# Patient Record
Sex: Female | Born: 1990 | Race: White | Hispanic: No | Marital: Married | Smoking: Never smoker
Health system: Southern US, Community
[De-identification: ages and names within clinical notes are randomized; demographics above are authoritative.]

## PROBLEM LIST (undated history)

## (undated) ENCOUNTER — Emergency Department: Discharge status: Absent without leave | Payer: BC Managed Care – PPO

## (undated) DIAGNOSIS — I471 Supraventricular tachycardia, unspecified: Secondary | ICD-10-CM

## (undated) DIAGNOSIS — F419 Anxiety disorder, unspecified: Secondary | ICD-10-CM

## (undated) HISTORY — DX: Supraventricular tachycardia: I47.1

## (undated) HISTORY — DX: Anxiety disorder, unspecified: F41.9

## (undated) HISTORY — PX: ABLATION: SHX5711

## (undated) HISTORY — PX: NO PAST SURGERIES: SHX2092

## (undated) HISTORY — DX: Supraventricular tachycardia, unspecified: I47.10

---
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
## 2013-06-26 DIAGNOSIS — Z9889 Other specified postprocedural states: Secondary | ICD-10-CM | POA: Insufficient documentation

## 2016-08-19 ENCOUNTER — Ambulatory Visit: Payer: BC Managed Care – PPO | Admitting: Internal Medicine

## 2016-09-17 ENCOUNTER — Ambulatory Visit: Payer: BC Managed Care – PPO | Admitting: Internal Medicine

## 2016-11-29 ENCOUNTER — Ambulatory Visit: Payer: BC Managed Care – PPO | Admitting: Internal Medicine

## 2016-11-29 ENCOUNTER — Encounter: Payer: Self-pay | Admitting: Internal Medicine

## 2016-11-29 ENCOUNTER — Ambulatory Visit (INDEPENDENT_AMBULATORY_CARE_PROVIDER_SITE_OTHER): Payer: BC Managed Care – PPO | Admitting: Internal Medicine

## 2016-11-29 ENCOUNTER — Other Ambulatory Visit: Payer: Self-pay | Admitting: Internal Medicine

## 2016-11-29 VITALS — BP 128/66 | HR 110 | Temp 98.0°F | Ht 64.0 in | Wt 130.4 lb

## 2016-11-29 DIAGNOSIS — I471 Supraventricular tachycardia: Secondary | ICD-10-CM | POA: Diagnosis not present

## 2016-11-29 DIAGNOSIS — N946 Dysmenorrhea, unspecified: Secondary | ICD-10-CM

## 2016-11-29 DIAGNOSIS — R5383 Other fatigue: Secondary | ICD-10-CM

## 2016-11-29 DIAGNOSIS — F419 Anxiety disorder, unspecified: Secondary | ICD-10-CM

## 2016-11-29 NOTE — Progress Notes (Signed)
Pre visit review using our clinic review tool, if applicable. No additional management support is needed unless otherwise documented below in the visit note. 

## 2016-11-29 NOTE — Progress Notes (Signed)
Patient ID: Sara Richardson, female   DOB: 12/06/1990, 26 y.o.   MRN: [id number]   Subjective:    Patient ID: Sara Richardson, female    DOB: 03/19/1991, 26 y.o.   MRN: [id number]  HPI  Patient here to establish care.  She reports she is doing well.  Has been seeing gyn.  She has a history of SVT.  Is s/p ablation x 2.  Heart is doing better.  She avoids caffeine and stimulants.  Has had holter monitor and echo.  Saw cardiology.  States everything checked out ok. SVT started when senior in high school.  No problems with increased exercise or activity. Tries to stay active.  She does report some fatigue.  Had w/up last year (withblood work) for fatigue.  Increased stress with her job.  Has a history of anxiety.  Doing better this year.  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  Increased stress with this.  She takes lorazepam prn.  Rarely takes now.  She has learned coping skills to help with anxiety.  This year at school is better.  Discussed counseling.  She is interested in counseling.  She also reports increased cramps with her menstrual cycle.  Tries OCPs in the past.  Caused headache and emesis.  Tried a lower dose ocp.  Still had issues.  Not interested in an IUD.  Has tried antiinflammatories with no relief.  Has regular periods.  May last 6-7 days.  Heavy periods.  Reports some fatigue. Sleeps well.  Feels rested in am.     Past Medical History:  Diagnosis Date  . Anxiety   . SVT (supraventricular tachycardia) (HCC)    Past Surgical History:  Procedure Laterality Date  . NO PAST SURGERIES     Family History  Problem Relation Age of Onset  . Breast cancer Maternal Grandmother     lung cancer   . Prostate cancer Maternal Grandfather    Social History   Social History  . Marital status: Single    Spouse name: N/A  . Number of children: N/A  . Years of education: N/A   Occupational History  . 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 Smoking status: Never Smoker  . Smokeless tobacco: Never Used  . Alcohol use No  . Drug use: No  . Sexual activity: Yes    Partners: Male    Birth control/ protection: None, Condom   Other Topics Concern  . None   Social History Narrative  . None    Outpatient Encounter Prescriptions as of 11/29/2016  Medication Sig  . LORazepam (ATIVAN) 0.5 MG tablet Take by mouth.   No facility-administered encounter medications on file as of 11/29/2016.     Review of Systems  Constitutional: Negative for appetite change and unexpected weight change.  HENT: Negative for congestion and sinus pressure.   Respiratory: Negative for cough, chest tightness and shortness of breath.   Cardiovascular: Negative for chest pain, palpitations and leg swelling.  Gastrointestinal: Negative for abdominal pain, diarrhea, nausea and vomiting.  Genitourinary: Negative for difficulty urinating and dysuria.       Cramping as outlined.    Musculoskeletal: Negative for back pain and joint swelling.  Skin: Negative for color change and rash.  Neurological: Negative for dizziness, light-headedness and headaches.  Psychiatric/Behavioral: Negative for agitation and dysphoric mood.       Increased anxiety.  Objective:    Physical Exam  Constitutional: She appears well-developed and well-nourished. No distress.  HENT:  Nose: Nose normal.  Mouth/Throat: Oropharynx is clear and moist.  Neck: Neck supple. No thyromegaly present.  Cardiovascular: Normal rate and regular rhythm.   Pulmonary/Chest: Breath sounds normal. No respiratory distress. She has no wheezes.  Abdominal: Soft. Bowel sounds are normal. There is no tenderness.  Musculoskeletal: She exhibits no edema or tenderness.  Lymphadenopathy:    She has no cervical adenopathy.  Skin: No rash noted. No erythema.  Psychiatric: She has a normal mood and affect. Her behavior is normal.    BP 128/66   Pulse (!) 110   Temp 98 F (36.7 C) (Oral)    Ht 5\' 4"  (1.626 m)   Wt 130 lb 6.4 oz (59.1 kg)   LMP 11/25/2016   SpO2 99%   BMI 22.38 kg/m  Wt Readings from Last 3 Encounters:  11/29/16 130 lb 6.4 oz (59.1 kg)       Assessment & Plan:   Problem List Items Addressed This Visit    Anxiety    Discussed with her today.  Discussed counseling.  Information given.  She has lorazepam if needed.  Follow.  Doing better.        Relevant Medications   LORazepam (ATIVAN) 0.5 MG tablet   Menstrual cramps    Increased cramps as outlined.  Periods regular.  Heavy.  Has tried various antiinflammatories and has tried varying times taking.  No help.  Has tried OCPs as outlined.  Did not tolerated.  Discussed other options such as IUD, progesterone only ocp, etc.  After discussion, she would like to see gyn to discuss options.        Relevant Orders   Ambulatory referral to Gynecology   SVT (supraventricular tachycardia) ([place])    S/p ablation x 2.  Doing well.  Follow.  Continues to avoid caffeine.  Follow.         Other Visit Diagnoses    Fatigue, unspecified type    -  Primary   discussed with her today.  had labs last year.  cbc, metabolic panel and thyroid - wnl.  hold on repeat labs.  anxiety better.  follow.         Einar Pheasant, MD

## 2016-12-01 ENCOUNTER — Encounter: Payer: Self-pay | Admitting: Internal Medicine

## 2016-12-01 DIAGNOSIS — I471 Supraventricular tachycardia, unspecified: Secondary | ICD-10-CM | POA: Insufficient documentation

## 2016-12-01 DIAGNOSIS — F419 Anxiety disorder, unspecified: Secondary | ICD-10-CM | POA: Insufficient documentation

## 2016-12-01 DIAGNOSIS — N946 Dysmenorrhea, unspecified: Secondary | ICD-10-CM | POA: Insufficient documentation

## 2016-12-01 NOTE — Assessment & Plan Note (Signed)
Increased cramps as outlined.  Periods regular.  Heavy.  Has tried various antiinflammatories and has tried varying times taking.  No help.  Has tried OCPs as outlined.  Did not tolerated.  Discussed other options such as IUD, progesterone only ocp, etc.  After discussion, she would like to see gyn to discuss options.

## 2016-12-01 NOTE — Assessment & Plan Note (Signed)
S/p ablation x 2.  Doing well.  Follow.  Continues to avoid caffeine.  Follow.

## 2016-12-01 NOTE — Assessment & Plan Note (Signed)
Discussed with her today.  Discussed counseling.  Information given.  She has lorazepam if needed.  Follow.  Doing better.

## 2016-12-03 ENCOUNTER — Telehealth: Payer: Self-pay | Admitting: *Deleted

## 2016-12-03 NOTE — Telephone Encounter (Signed)
Okay to refill? Last OV: 11/29/16 & Next OV: 04/08/17

## 2016-12-04 MED ORDER — LORAZEPAM 0.5 MG PO TABS: ORAL_TABLET | ORAL | 0 refills | Status: DC

## 2016-12-04 NOTE — Telephone Encounter (Signed)
ok'd refill for lorazepam #30 with no refills.   

## 2016-12-06 NOTE — Telephone Encounter (Signed)
Pt has requested a update for this Rx  Pt contact [phone]

## 2016-12-06 NOTE — Telephone Encounter (Signed)
RX has been re-faxed.

## 2017-02-14 ENCOUNTER — Encounter: Payer: Self-pay | Admitting: Obstetrics and Gynecology

## 2017-04-08 ENCOUNTER — Ambulatory Visit: Payer: BC Managed Care – PPO | Admitting: Internal Medicine

## 2017-04-08 DIAGNOSIS — Z0289 Encounter for other administrative examinations: Secondary | ICD-10-CM

## 2017-04-29 ENCOUNTER — Ambulatory Visit (INDEPENDENT_AMBULATORY_CARE_PROVIDER_SITE_OTHER): Payer: BC Managed Care – PPO | Admitting: Obstetrics and Gynecology

## 2017-04-29 ENCOUNTER — Encounter: Payer: Self-pay | Admitting: Obstetrics and Gynecology

## 2017-04-29 VITALS — BP 117/82 | HR 145 | Ht 62.0 in | Wt 127.9 lb

## 2017-04-29 DIAGNOSIS — Z7689 Persons encountering health services in other specified circumstances: Secondary | ICD-10-CM

## 2017-04-29 DIAGNOSIS — N946 Dysmenorrhea, unspecified: Secondary | ICD-10-CM

## 2017-04-29 MED ORDER — NORETHINDRONE 0.35 MG PO TABS: 1.0000 | ORAL_TABLET | Freq: Every day | ORAL | 11 refills | Status: DC

## 2017-04-29 NOTE — Progress Notes (Signed)
Subjective:     Patient ID: Sara Richardson, female   DOB: [birth year], 26 y.o.   MRN: [id number]  HPI Always had painful menses, used to throw up when in college as it was so severe, now more related to frequent BMs for 2-3 days. interfers with job functions due to having to go to the bathroom constently.  Is a Pharmacist, hospital. Onset at age 22.  Reports monthly menses with light cramps the first day or two, then heavy for 2-3 days then lighter, total 7-8 days. Takes ibuprofen and it helped, but can't use it per cardiologist, and h/o SVT.  Took oral contraception in college, but couldn't tolerate them- vomiting/migraines/ sick all the time.  Is sexually active, using condoms.  Review of Systems     Objective:   Physical Exam A&O x4 Well groomed female in no distress Blood pressure 117/82, pulse (!) 145, height _0  (1.575 m), weight 127 lb 14.4 oz (58 kg), last menstrual period 04/12/2017. HRR and within normal range when rechecked.  Lungs clear Abdomen soft and not tender Pelvic exam deferred    Assessment:     Establish care Dysmenorrhea Situational anxiety H/o SVT    Plan:     Counseled at length regarding options for treating dysmenorrhea and associated diarrhea, including OCPs, Depo,Nexplanon, Mirena, anti-spasmotics, & tylenol.  Is interested in Depo, but will start with progestin only pill to monitor for side-effects. Will return in 6-8 weeks for annual exam and will follow up and consider switching to Depo at that time.  >50% of 20 minute visit spent in counseling.  Melody Gustine, CNM

## 2017-04-29 NOTE — Patient Instructions (Signed)
Dysmenorrhea °Menstrual cramps (dysmenorrhea) are caused by the muscles of the uterus tightening (contracting) during a menstrual period. For some women, this discomfort is merely bothersome. For others, dysmenorrhea can be severe enough to interfere with everyday activities for a few days each month. °Primary dysmenorrhea is menstrual cramps that last a couple of days when you start having menstrual periods or soon after. This often begins after a teenager starts having her period. As a woman gets older or has a baby, the cramps will usually lessen or disappear. Secondary dysmenorrhea begins later in life, lasts longer, and the pain may be stronger than primary dysmenorrhea. The pain may start before the period and last a few days after the period. °What are the causes? °Dysmenorrhea is usually caused by an underlying problem, such as: °· The tissue lining the uterus grows outside of the uterus in other areas of the body (endometriosis). °· The endometrial tissue, which normally lines the uterus, is found in or grows into the muscular walls of the uterus (adenomyosis). °· The pelvic blood vessels are engorged with blood just before the menstrual period (pelvic congestive syndrome). °· Overgrowth of cells (polyps) in the lining of the uterus or cervix. °· Falling down of the uterus (prolapse) because of loose or stretched ligaments. °· Depression. °· Bladder problems, infection, or inflammation. °· Problems with the intestine, a tumor, or irritable bowel syndrome. °· Cancer of the female organs or bladder. °· A severely tipped uterus. °· A very tight opening or closed cervix. °· Noncancerous tumors of the uterus (fibroids). °· Pelvic inflammatory disease (PID). °· Pelvic scarring (adhesions) from a previous surgery. °· Ovarian cyst. °· An intrauterine device (IUD) used for birth control. °What increases the risk? °You may be at greater risk of dysmenorrhea if: °· You are younger than age 30. °· You started puberty  early. °· You have irregular or heavy bleeding. °· You have never given birth. °· You have a family history of this problem. °· You are a smoker. °What are the signs or symptoms? °· Cramping or throbbing pain in your lower abdomen. °· Headaches. °· Lower back pain. °· Nausea or vomiting. °· Diarrhea. °· Sweating or dizziness. °· Loose stools. °How is this diagnosed? °A diagnosis is based on your history, symptoms, physical exam, diagnostic tests, or procedures. Diagnostic tests or procedures may include: °· Blood tests. °· Ultrasonography. °· An examination of the lining of the uterus (dilation and curettage, D&C). °· An examination inside your abdomen or pelvis with a scope (laparoscopy). °· X-rays. °· CT scan. °· MRI. °· An examination inside the bladder with a scope (cystoscopy). °· An examination inside the intestine or stomach with a scope (colonoscopy, gastroscopy). °How is this treated? °Treatment depends on the cause of the dysmenorrhea. Treatment may include: °· Pain medicine prescribed by your health care provider. °· Birth control pills or an IUD with progesterone hormone in it. °· Hormone replacement therapy. °· Nonsteroidal anti-inflammatory drugs (NSAIDs). These may help stop the production of prostaglandins. °· Surgery to remove adhesions, endometriosis, ovarian cyst, or fibroids. °· Removal of the uterus (hysterectomy). °· Progesterone shots to stop the menstrual period. °· Cutting the nerves on the sacrum that go to the female organs (presacral neurectomy). °· Electric current to the sacral nerves (sacral nerve stimulation). °· Antidepressant medicine. °· Psychiatric therapy, counseling, or group therapy. °· Exercise and physical therapy. °· Meditation and yoga therapy. °· Acupuncture. °Follow these instructions at home: °· Only take over-the-counter or prescription medicines as directed   by your health care provider. °· Place a heating pad or hot water bottle on your lower back or abdomen. Do not  sleep with the heating pad. °· Use aerobic exercises, walking, swimming, biking, and other exercises to help lessen the cramping. °· Massage to the lower back or abdomen may help. °· Stop smoking. °· Avoid alcohol and caffeine. °Contact a health care provider if: °· Your pain does not get better with medicine. °· You have pain with sexual intercourse. °· Your pain increases and is not controlled with medicines. °· You have abnormal vaginal bleeding with your period. °· You develop nausea or vomiting with your period that is not controlled with medicine. °Get help right away if: °You pass out. °This information is not intended to replace advice given to you by your health care provider. Make sure you discuss any questions you have with your health care provider. °Document Released: 10/28/2005 Document Revised: 04/04/2016 Document Reviewed: 04/15/2013 °Elsevier Interactive Patient Education © 2017 Elsevier Inc. ° °

## 2017-04-30 ENCOUNTER — Encounter: Payer: Self-pay | Admitting: Obstetrics and Gynecology

## 2017-05-08 ENCOUNTER — Encounter: Payer: Self-pay | Admitting: Obstetrics and Gynecology

## 2017-06-18 ENCOUNTER — Encounter: Payer: Self-pay | Admitting: Obstetrics and Gynecology

## 2017-06-18 ENCOUNTER — Ambulatory Visit (INDEPENDENT_AMBULATORY_CARE_PROVIDER_SITE_OTHER): Payer: BC Managed Care – PPO | Admitting: Obstetrics and Gynecology

## 2017-06-18 VITALS — BP 124/80 | HR 104 | Ht 62.0 in | Wt 131.7 lb

## 2017-06-18 DIAGNOSIS — Z01419 Encounter for gynecological examination (general) (routine) without abnormal findings: Secondary | ICD-10-CM | POA: Diagnosis not present

## 2017-06-18 MED ORDER — MEDROXYPROGESTERONE ACETATE 150 MG/ML IM SUSP: 150.0000 mg | INTRAMUSCULAR | 4 refills | Status: DC

## 2017-06-18 NOTE — Patient Instructions (Addendum)
Preventive Care 18-39 Years, Female Preventive care refers to lifestyle choices and visits with your health care provider that can promote health and wellness. What does preventive care include?  A yearly physical exam. This is also called an annual well check.  Dental exams once or twice a year.  Routine eye exams. Ask your health care provider how often you should have your eyes checked.  Personal lifestyle choices, including: ? Daily care of your teeth and gums. ? Regular physical activity. ? Eating a healthy diet. ? Avoiding tobacco and drug use. ? Limiting alcohol use. ? Practicing safe sex. ? Taking vitamin and mineral supplements as recommended by your health care provider. What happens during an annual well check? The services and screenings done by your health care provider during your annual well check will depend on your age, overall health, lifestyle risk factors, and family history of disease. Counseling Your health care provider may ask you questions about your:  Alcohol use.  Tobacco use.  Drug use.  Emotional well-being.  Home and relationship well-being.  Sexual activity.  Eating habits.  Work and work Statistician.  Method of birth control.  Menstrual cycle.  Pregnancy history.  Screening You may have the following tests or measurements:  Height, weight, and BMI.  Diabetes screening. This is done by checking your blood sugar (glucose) after you have not eaten for a while (fasting).  Blood pressure.  Lipid and cholesterol levels. These may be checked every 5 years starting at age 38.  Skin check.  Hepatitis C blood test.  Hepatitis B blood test.  Sexually transmitted disease (STD) testing.  BRCA-related cancer screening. This may be done if you have a family history of breast, ovarian, tubal, or peritoneal cancers.  Pelvic exam and Pap test. This may be done every 3 years starting at age 38. Starting at age 30, this may be done  every 5 years if you have a Pap test in combination with an HPV test.  Discuss your test results, treatment options, and if necessary, the need for more tests with your health care provider. Vaccines Your health care provider may recommend certain vaccines, such as:  Influenza vaccine. This is recommended every year.  Tetanus, diphtheria, and acellular pertussis (Tdap, Td) vaccine. You may need a Td booster every 10 years.  Varicella vaccine. You may need this if you have not been vaccinated.  HPV vaccine. If you are 39 or younger, you may need three doses over 6 months.  Measles, mumps, and rubella (MMR) vaccine. You may need at least one dose of MMR. You may also need a second dose.  Pneumococcal 13-valent conjugate (PCV13) vaccine. You may need this if you have certain conditions and were not previously vaccinated.  Pneumococcal polysaccharide (PPSV23) vaccine. You may need one or two doses if you smoke cigarettes or if you have certain conditions.  Meningococcal vaccine. One dose is recommended if you are age 68-21 years and a first-year college student living in a residence hall, or if you have one of several medical conditions. You may also need additional booster doses.  Hepatitis A vaccine. You may need this if you have certain conditions or if you travel or work in places where you may be exposed to hepatitis A.  Hepatitis B vaccine. You may need this if you have certain conditions or if you travel or work in places where you may be exposed to hepatitis B.  Haemophilus influenzae type b (Hib) vaccine. You may need this  if you have certain risk factors.  Talk to your health care provider about which screenings and vaccines you need and how often you need them. This information is not intended to replace advice given to you by your health care provider. Make sure you discuss any questions you have with your health care provider. Document Released: 12/24/2001 Document Revised:  07/17/2016 Document Reviewed: 08/29/2015 Elsevier Interactive Patient Education  2017 Elsevier Inc.   Probiotics What are probiotics? Probiotics are the good bacteria and yeasts that live in your body and keep you and your digestive system healthy. Probiotics also help your body's defense (immune) system and protect your body against bad bacterial growth. Certain foods contain probiotics, such as yogurt. Probiotics can also be purchased as a supplement. As with any supplement or drug, it is important to discuss its use with your health care provider. What affects the balance of bacteria in my body? The balance of bacteria in your body can be affected by:  Antibiotic medicines. Antibiotics are sometimes necessary to treat infection. Unfortunately, they may kill good or friendly bacteria in your body as well as the bad bacteria. This may lead to stomach problems like diarrhea, gas, and cramping.  Disease. Some conditions are the result of an overgrowth of bad bacteria, yeasts, parasites, or fungi. These conditions include: ? Infectious diarrhea. ? Stomach and respiratory infections. ? Skin infections. ? Irritable bowel syndrome (IBS). ? Inflammatory bowel diseases. ? Ulcer due to Helicobacter pylori (H. pylori) infection. ? Tooth decay and periodontal disease. ? Vaginal infections.  Stress and poor diet may also lower the good bacteria in your body. What type of probiotic is right for me? Probiotics are available over the counter at your local pharmacy, health food, or grocery store. They come in many different forms, combinations of strains, and dosing strengths. Some may need to be refrigerated. Always read the label for storage and usage instructions. Specific strains have been shown to be more effective for certain conditions. Ask your health care provider what option is best for you. Why would I need probiotics? There are many reasons your health care provider might recommend a  probiotic supplement, including:  Diarrhea.  Constipation.  IBS.  Respiratory infections.  Yeast infections.  Acne, eczema, and other skin conditions.  Frequent urinary tract infections (UTIs).  Are there side effects of probiotics? Some people experience mild side effects when taking probiotics. Side effects are usually temporary and may include:  Gas.  Bloating.  Cramping.  Rarely, serious side effects, such as infection or immune system changes, may occur. What else do I need to know about probiotics?  There are many different strains of probiotics. Certain strains may be more effective depending on your condition. Probiotics are available in varying doses. Ask your health care provider which probiotic you should use and how often.  If you are taking probiotics along with antibiotics, it is generally recommended to wait at least 2 hours between taking the antibiotic and taking the probiotic. For more information: [facility] [employer]cy This information is not intended to replace advice given to you by your health care provider. Make sure you discuss any questions you have with your health care provider. Document Released: 05/25/2014 Document Revised: 09/24/2016 Document Reviewed: 01/25/2014 Elsevier Interactive Patient Education  2017 [employer].   Hormonal Contraception Information Hormonal contraception is a type of birth control that uses hormones to prevent pregnancy. It usually involves a combination of the hormones estrogen and progesterone  or only the hormone progesterone. Hormonal contraception works in these ways:  It thickens the mucus in the cervix, making it harder for sperm to enter the uterus.  It changes the lining of the uterus, making it harder for an egg to implant.  It may stop the ovaries from releasing eggs (ovulation). Some women who take hormonal contraceptives that contain only  progesterone may continue to ovulate.  Hormonal contraception cannot prevent sexually transmitted infections (STIs). Pregnancy may still occur. Estrogen and progesterone contraceptives Contraceptives that use a combination of estrogen and progesterone are available in these forms:  Pill. Pills come in different combinations of hormones. They must be taken at the same time each day. Pills can affect your period, causing you to get your period once every three months or not at all.  Patch. The patch must be worn on the lower abdomen for three weeks and then removed on the fourth.  Vaginal ring. The ring is placed in the vagina and left there for three weeks. It is then removed for one week.  Progesterone contraceptives Contraceptives that use progesterone only are available in these forms:  Pill. Pills should be taken every day of the cycle.  Intrauterine device (IUD). This device is inserted into the uterus and removed or replaced every five years or sooner.  Implant. Plastic rods are placed under the skin of the upper arm. They are removed or replaced every three years or sooner.  Injection. The injection is given once every 90 days.  What are the side effects? The side effects of estrogen and progesterone contraceptives include:  Nausea.  Headaches.  Breast tenderness.  Bleeding or spotting between menstrual cycles.  High blood pressure (rare).  Strokes, heart attacks, or blood clots (rare)  Side effects of progesterone-only contraceptives include:  Nausea.  Headaches.  Breast tenderness.  Unpredictable menstrual bleeding.  High blood pressure (rare).  Talk to your health care provider about what side effects may affect you. Where to find more information:  Ask your health care provider for more information and resources about hormonal contraception.  [place] Department of Health and Programmer, systems on Women's Health: [place] Questions to  ask:  What type of hormonal contraception is right for me?  How long should I plan to use hormonal contraception?  What are the side effects of the hormonal contraception method I choose?  How can I prevent STIs while using hormonal contraception? Contact a health care provider if:  You start taking hormonal contraceptives and you develop persistent or severe side effects. Summary  Estrogen and progesterone are hormones used in many forms of birth control.  Talk to your health care provider about what side effects may affect you.  Hormonal contraception cannot prevent sexually transmitted infections (STIs).  Ask your health care provider for more information and resources about hormonal contraception. This information is not intended to replace advice given to you by your health care provider. Make sure you discuss any questions you have with your health care provider. Document Released: 11/17/2007 Document Revised: 09/27/2016 Document Reviewed: 09/27/2016 Elsevier Interactive Patient Education  [employer].

## 2017-06-18 NOTE — Progress Notes (Signed)
   Subjective:     Sara Richardson is a married white  26 y.o. female and is here for a comprehensive physical exam. The patient reports no problems. Different from las visit. Started OCPs two weeks ago and tolerating fine. Does feel like she has loose stools in general.  Social History   Social History  . Marital status: Single    Spouse name: N/A  . Number of children: N/A  . Years of education: N/A   Occupational History  . 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 Smoking status: Never Smoker  . Smokeless tobacco: Never Used  . Alcohol use No  . Drug use: No  . Sexual activity: Yes    Partners: Male    Birth control/ protection: None, Condom   Other Topics Concern  . Not on file   Social History Narrative  . No narrative on file   Health Maintenance  Topic Date Due  . TETANUS/TDAP  07/11/2011  . PAP SMEAR  07/10/2013  . INFLUENZA VACCINE  06/11/2017  . HIV Screening  12/05/2017 (Originally 07/11/2007)    The following portions of the patient's history were reviewed and updated as appropriate: allergies, current medications, past family history, past medical history, past social history, past surgical history and problem list.  Review of Systems Pertinent items noted in HPI and remainder of comprehensive ROS otherwise negative.   Objective:    General appearance: alert, cooperative and appears stated age Neck: no adenopathy, no carotid bruit, no JVD, supple, symmetrical, trachea midline and thyroid not enlarged, symmetric, no tenderness/mass/nodules Lungs: clear to auscultation bilaterally Breasts: normal appearance, no masses or tenderness Heart: regular rate and rhythm, S1, S2 normal, no murmur, click, rub or gallop Abdomen: soft, non-tender; bowel sounds normal; no masses,  no organomegaly Pelvic: cervix normal in appearance, external genitalia normal, no adnexal masses or tenderness, no cervical motion tenderness, rectovaginal  septum normal, uterus normal size, shape, and consistency, vagina normal without discharge and anteflexed    Assessment:    Healthy female exam. Dysmenorrhea  Family history of breast & colon cancer in MGM     Plan:  Does desire trial of Depo, will pick up script and return at her convience to get first shot, to continue camila until then. Discuss genetic screening, will talk to mother about it and let me know. Recommended bowel cleanse and then 1 month of daily pro-biotics.  RTC 1 year for AE or as needed.  Tearah Saulsbury Gayla Medicus, CNM   See After Visit Summary for Counseling Recommendations

## 2017-06-19 ENCOUNTER — Other Ambulatory Visit: Payer: Self-pay | Admitting: Obstetrics and Gynecology

## 2017-06-20 ENCOUNTER — Encounter: Payer: Self-pay | Admitting: Obstetrics and Gynecology

## 2017-06-20 LAB — CYTOLOGY - PAP

## 2017-06-30 ENCOUNTER — Ambulatory Visit: Payer: BC Managed Care – PPO | Admitting: Internal Medicine

## 2017-07-02 ENCOUNTER — Encounter: Payer: Self-pay | Admitting: Obstetrics and Gynecology

## 2017-07-03 ENCOUNTER — Other Ambulatory Visit: Payer: Self-pay | Admitting: *Deleted

## 2017-07-03 MED ORDER — NORETHINDRONE 0.35 MG PO TABS: 1.0000 | ORAL_TABLET | Freq: Every day | ORAL | 6 refills | Status: DC

## 2017-09-08 ENCOUNTER — Ambulatory Visit: Payer: BC Managed Care – PPO | Admitting: Obstetrics and Gynecology

## 2017-11-10 ENCOUNTER — Ambulatory Visit: Payer: Self-pay | Admitting: Nurse Practitioner

## 2017-11-10 ENCOUNTER — Ambulatory Visit: Payer: Self-pay

## 2017-11-10 VITALS — BP 122/74 | HR 139 | Temp 98.4°F | Resp 20

## 2017-11-10 DIAGNOSIS — M5440 Lumbago with sciatica, unspecified side: Secondary | ICD-10-CM

## 2017-11-10 MED ORDER — ETODOLAC 300 MG PO CAPS: 300.0000 mg | ORAL_CAPSULE | Freq: Three times a day (TID) | ORAL | 0 refills | Status: AC

## 2017-11-10 MED ORDER — CYCLOBENZAPRINE HCL 10 MG PO TABS: 10.0000 mg | ORAL_TABLET | Freq: Three times a day (TID) | ORAL | 0 refills | Status: AC | PRN

## 2017-11-10 MED ORDER — PREDNISONE 10 MG (21) PO TBPK: ORAL_TABLET | ORAL | 0 refills | Status: DC

## 2017-11-10 NOTE — Progress Notes (Signed)
Subjective:    Sara Richardson is a 26 y.o. female who presents for evaluation of low back pain. The patient has had recurrent self limited episodes of low back pain in the past. Symptoms have been present for 2 days and are gradually worsening.  Onset was related to / precipitated by no known injury and patient had a massage on Friday.. The pain is located in the across the lower back or radiating to bilateral leg(s) and radiates to the right thigh, left thigh. The pain is described as burning and sharp and occurs all day. She rates her pain as a 5 on a scale of 0-10. Symptoms are exacerbated by sitting up and walking. Symptoms are improved by lying down. She has also tried acetaminophen and NSAIDs which provided no symptom relief. She has no other symptoms associated with the back pain. History indicative of potentially complicated back pain includes no red flags..  The following portions of the patient's history were reviewed and updated as appropriate: allergies, current medications and past medical history.  Review of Systems Constitutional: negative Respiratory: negative Cardiovascular: negative Musculoskeletal:positive for back pain, negative for back pain and muscle spasms Neurological: negative Behavioral/Psych: negative    Objective:   Normal reflexes, gait, strength and negative straight-leg raise. Inspection and palpation: inspection of back is normal, no tenderness noted. Muscle tone and ROM exam: muscle tone normal without spasm, limited range of motion with pain, patient unable to touch toes or bend side to side without pain. Neurological: normal DTRs, muscle strength and reflexes. No loss of bowel or bladder function    Assessment:   Sciatica Plan:    Natural history and expected course discussed. Questions answered. Neurosurgeon distributed. Proper lifting, bending technique discussed. Stretching exercises discussed. Short (2-4 day) period of relative rest  recommended until acute symptoms improve. Ice to affected area as needed for local pain relief. NSAIDs per medication orders. OTC analgesics as needed. Muscle relaxants per medication orders. Sterapred Dose pack prescribed.

## 2017-11-10 NOTE — Patient Instructions (Addendum)
Acute Pain, Adult Acute pain is a type of pain that may last for just a few days or as long as six months. It is often related to an illness, injury, or medical procedure. Acute pain may be mild, moderate, or severe. It usually goes away once your injury has healed or you are no longer ill. Pain can make it hard for you to do daily activities. It can cause anxiety and lead to other problems if left untreated. Treatment depends on the cause and severity of your acute pain. Follow these instructions at home:  Check your pain level as told by your health care provider.  Take over-the-counter and prescription medicines only as told by your health care provider.  If you are taking prescription pain medicine: ? Ask your health care provider about taking a stool softener or laxative to prevent constipation. ? Do not stop taking the medicine suddenly. Talk to your health care provider about how and when to discontinue prescription pain medicine. ? If your pain is severe, do not take more pills than instructed by your health care provider. ? Do not take other over-the-counter pain medicines in addition to this medicine unless told by your health care provider. ? Do not drive or operate heavy machinery while taking prescription pain medicine.  Apply ice or heat as told by your health care provider. These may reduce swelling and pain.  Ask your health care provider if other strategies such as distraction, relaxation, or physical therapies can help your pain.  Keep all follow-up visits as told by your health care provider. This is important.  Take Sterapred as directed.  Continue to use ES Tylenol until steroids are completed, then use Etodolac as directed.  Apply heat to the affected areas 3-4 times daily.  Use back exercises provided.  Also try Salonpas patches to the lower back until symptoms improve.  Try to keep moving to avoid stiffness until symptoms improve. Contact a health care provider  if:  You have pain that is not controlled by medicine.  Your pain does not improve or gets worse.  You have side effects from pain medicines, such as vomitingor confusion. Get help right away if:  You have severe pain.  You have trouble breathing.  You lose consciousness.  You have chest pain or pressure that lasts for more than a few minutes. Along with the chest pain you may: ? Have pain or discomfort in one or both arms, your back, neck, jaw, or stomach. ? Have shortness of breath. ? Break out in a cold sweat. ? Feel nauseous. ? Become light-headed. These symptoms may represent a serious problem that is an emergency. Do not wait to see if the symptoms will go away. Get medical help right away. Call your local emergency services (911 in the [place] Do not drive yourself to the hospital. This information is not intended to replace advice given to you by your health care provider. Make sure you discuss any questions you have with your health care provider. Document Released: 11/12/2015 Document Revised: 04/05/2016 Document Reviewed: 11/12/2015 Elsevier Interactive Patient Education  2018 [employer].  Back Exercises The following exercises strengthen the muscles that help to support the back. They also help to keep the lower back flexible. Doing these exercises can help to prevent back pain or lessen existing pain. If you have back pain or discomfort, try doing these exercises 2-3 times each day or as told by your health care provider. When the pain goes away, do  them once each day, but increase the number of times that you repeat the steps for each exercise (do more repetitions). If you do not have back pain or discomfort, do these exercises once each day or as told by your health care provider. Exercises Single Knee to Chest  Repeat these steps 3-5 times for each leg: 1. Lie on your back on a firm bed or the floor with your legs extended. 2. Bring one knee to your chest. Your  other leg should stay extended and in contact with the floor. 3. Hold your knee in place by grabbing your knee or thigh. 4. Pull on your knee until you feel a gentle stretch in your lower back. 5. Hold the stretch for 10-30 seconds. 6. Slowly release and straighten your leg.  Pelvic Tilt  Repeat these steps 5-10 times: 1. Lie on your back on a firm bed or the floor with your legs extended. 2. Bend your knees so they are pointing toward the ceiling and your feet are flat on the floor. 3. Tighten your lower abdominal muscles to press your lower back against the floor. This motion will tilt your pelvis so your tailbone points up toward the ceiling instead of pointing to your feet or the floor. 4. With gentle tension and even breathing, hold this position for 5-10 seconds.  Cat-Cow  Repeat these steps until your lower back becomes more flexible: 1. Get into a hands-and-knees position on a firm surface. Keep your hands under your shoulders, and keep your knees under your hips. You may place padding under your knees for comfort. 2. Let your head hang down, and point your tailbone toward the floor so your lower back becomes rounded like the back of a cat. 3. Hold this position for 5 seconds. 4. Slowly lift your head and point your tailbone up toward the ceiling so your back forms a sagging arch like the back of a cow. 5. Hold this position for 5 seconds.  Press-Ups  Repeat these steps 5-10 times: 1. Lie on your abdomen (face-down) on the floor. 2. Place your palms near your head, about shoulder-width apart. 3. While you keep your back as relaxed as possible and keep your hips on the floor, slowly straighten your arms to raise the top half of your body and lift your shoulders. Do not use your back muscles to raise your upper torso. You may adjust the placement of your hands to make yourself more comfortable. 4. Hold this position for 5 seconds while you keep your back relaxed. 5. Slowly return  to lying flat on the floor.  Bridges  Repeat these steps 10 times: 1. Lie on your back on a firm surface. 2. Bend your knees so they are pointing toward the ceiling and your feet are flat on the floor. 3. Tighten your buttocks muscles and lift your buttocks off of the floor until your waist is at almost the same height as your knees. You should feel the muscles working in your buttocks and the back of your thighs. If you do not feel these muscles, slide your feet 1-2 inches farther away from your buttocks. 4. Hold this position for 3-5 seconds. 5. Slowly lower your hips to the starting position, and allow your buttocks muscles to relax completely.  If this exercise is too easy, try doing it with your arms crossed over your chest. Abdominal Crunches  Repeat these steps 5-10 times: 1. Lie on your back on a firm bed or the floor  with your legs extended. 2. Bend your knees so they are pointing toward the ceiling and your feet are flat on the floor. 3. Cross your arms over your chest. 4. Tip your chin slightly toward your chest without bending your neck. 5. Tighten your abdominal muscles and slowly raise your trunk (torso) high enough to lift your shoulder blades a tiny bit off of the floor. Avoid raising your torso higher than that, because it can put too much stress on your low back and it does not help to strengthen your abdominal muscles. 6. Slowly return to your starting position.  Back Lifts Repeat these steps 5-10 times: 1. Lie on your abdomen (face-down) with your arms at your sides, and rest your forehead on the floor. 2. Tighten the muscles in your legs and your buttocks. 3. Slowly lift your chest off of the floor while you keep your hips pressed to the floor. Keep the back of your head in line with the curve in your back. Your eyes should be looking at the floor. 4. Hold this position for 3-5 seconds. 5. Slowly return to your starting position.  Contact a health care provider  if:  Your back pain or discomfort gets much worse when you do an exercise.  Your back pain or discomfort does not lessen within 2 hours after you exercise. If you have any of these problems, stop doing these exercises right away. Do not do them again unless your health care provider says that you can. Get help right away if:  You develop sudden, severe back pain. If this happens, stop doing the exercises right away. Do not do them again unless your health care provider says that you can. This information is not intended to replace advice given to you by your health care provider. Make sure you discuss any questions you have with your health care provider. Document Released: 12/05/2004 Document Revised: 03/06/2016 Document Reviewed: 12/22/2014 Elsevier Interactive Patient Education  2017 [employer].

## 2017-11-10 NOTE — Telephone Encounter (Signed)
FYI

## 2017-11-10 NOTE — Telephone Encounter (Signed)
Pt calling with c/o lower back pain that shoots down to both thighs (right >left). Describes the shooting pain as burning and sharp. Rates pain 7 out of 10. States pain goes away if laying in bed and starts with sitting up or walking.  Pt states she had a massage on Friday and woke up Saturday am in pain. Care advice given. No appts available with PCP. Referred to [facility]. Pt has appt with [facility] @ 1200.   Reason for Disposition . [1] Pain radiates into the thigh or further down the leg AND [2] both legs  Answer Assessment - Initial Assessment Questions 1. ONSET: "When did the pain begin?"      Saturday 2. LOCATION: "Where does it hurt?" (upper, mid or lower back)     Lower back 3. SEVERITY: "How bad is the pain?"  (e.g., Scale 1-10; mild, moderate, or severe)   - MILD (1-3): doesn't interfere with normal activities    - MODERATE (4-7): interferes with normal activities or awakens from sleep    - SEVERE (8-10): excruciating pain, unable to do any normal activities      7 4. PATTERN: "Is the pain constant?" (e.g., yes, no; constant, intermittent)      Yes if not laying down. Sitting up and walking makes it worse 5. RADIATION: "Does the pain shoot into your legs or elsewhere?"     Yes both legs to the thigh and right leg is worse than left 6. CAUSE:  "What do you think is causing the back pain?"      Has had happened several times before but had a massage Friday. 7. BACK OVERUSE:  "Any recent lifting of heavy objects, strenuous work or exercise?"     no 8. MEDICATIONS: "What have you taken so far for the pain?" (e.g., nothing, acetaminophen, NSAIDS)     alieve and extra strength Tylenol 9. NEUROLOGIC SYMPTOMS: "Do you have any weakness, numbness, or problems with bowel/bladder control?"     no 10. OTHER SYMPTOMS: "Do you have any other symptoms?" (e.g., fever, abdominal pain, burning with urination, blood in urine)       no 11. PREGNANCY: "Is there any chance you are pregnant?" (e.g.,  yes, no; LMP)       No LMP: week before Christmas  Protocols used: BACK PAIN-A-AH

## 2017-11-12 ENCOUNTER — Telehealth: Payer: Self-pay

## 2017-11-12 NOTE — Telephone Encounter (Signed)
I left a message asking the patient to call us back. 

## 2017-12-01 ENCOUNTER — Ambulatory Visit (INDEPENDENT_AMBULATORY_CARE_PROVIDER_SITE_OTHER): Payer: Self-pay | Admitting: Family

## 2017-12-01 ENCOUNTER — Encounter: Payer: Self-pay | Admitting: Family

## 2017-12-01 VITALS — BP 133/75 | Temp 98.1°F | Wt 128.0 lb

## 2017-12-01 DIAGNOSIS — J029 Acute pharyngitis, unspecified: Secondary | ICD-10-CM

## 2017-12-01 LAB — POCT RAPID STREP A (OFFICE): Rapid Strep A Screen: NEGATIVE

## 2017-12-01 MED ORDER — PREDNISONE 10 MG (21) PO TBPK: ORAL_TABLET | ORAL | 0 refills | Status: DC

## 2017-12-01 NOTE — Patient Instructions (Signed)

## 2017-12-01 NOTE — Progress Notes (Signed)
Subjective:     Patient ID: Sara Richardson, female   DOB: [birth year], 27 y.o.   MRN: [id number]  HPI 27 year old female is in today with c/o sore throat, cough and chest congestion x 3 days. She has been taking OTC medication w/o much relief.   Review of Systems  Constitutional: Negative.   HENT: Positive for congestion, postnasal drip and sore throat.   Respiratory: Positive for cough. Negative for shortness of breath and wheezing.   Gastrointestinal: Negative.   Endocrine: Negative.   Musculoskeletal: Negative.   Skin: Negative.   Allergic/Immunologic: Negative.   Neurological: Negative.   Psychiatric/Behavioral: Negative.    Past Medical History:  Diagnosis Date  . Anxiety   . SVT (supraventricular tachycardia) (HCC)     Social History   Socioeconomic History  . Marital status: Single    Spouse name: Not on file  . Number of children: Not on file  . Years of education: Not on file  . Highest education level: Not on file  Social Needs  . Financial resource strain: Not on file  . Food insecurity - worry: Not on file  . Food insecurity - inability: Not on file  . Transportation needs - medical: Not on file  . Transportation needs - non-medical: Not on file  Occupational History  . 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 Smoking status: Never Smoker  . Smokeless tobacco: Never Used  Substance and Sexual Activity  . Alcohol use: No  . Drug use: No  . Sexual activity: Yes    Partners: Male    Birth control/protection: None, Condom  Other Topics Concern  . Not on file  Social History Narrative  . Not on file    Past Surgical History:  Procedure Laterality Date  . NO PAST SURGERIES      Family History  Problem Relation Age of Onset  . Breast cancer Maternal Grandmother        lung cancer   . Prostate cancer Maternal Grandfather     No Known Allergies  Current Outpatient Medications on File Prior to Visit  Medication Sig  Dispense Refill  . LORazepam (ATIVAN) 0.5 MG tablet 1/2 - 1 tablet q day prn 30 tablet 0  . norethindrone (MICRONOR,CAMILA,ERRIN) 0.35 MG tablet Take 1 tablet (0.35 mg total) by mouth daily. 3 Package 6  . medroxyPROGESTERone (DEPO-PROVERA) 150 MG/ML injection Inject 1 mL (150 mg total) into the muscle every 3 (three) months. (Patient not taking: Reported on 11/10/2017) 1 mL 4   No current facility-administered medications on file prior to visit.     BP 133/75   Temp 98.1 F (36.7 C)   Wt 128 lb (58.1 kg)   SpO2 100%   BMI 23.41 kg/m chart    Objective:   Physical Exam  Constitutional: She is oriented to person, place, and time. She appears well-developed and well-nourished.  HENT:  Right Ear: External ear normal.  Left Ear: External ear normal.  Pharynx moderately red  Neck: Normal range of motion. Neck supple.  Cardiovascular: Normal rate, regular rhythm and normal heart sounds.  Pulmonary/Chest: Effort normal and breath sounds normal.  Musculoskeletal: Normal range of motion.  Neurological: She is alert and oriented to person, place, and time.  Skin: Skin is warm and dry.  Psychiatric: She has a normal mood and affect.       Assessment:     Tyshia was seen today for selfpay-[place]-sorethroayt x2d.  Diagnoses and all orders for this visit:  Sorethroat -     POCT rapid strep A  Pharyngitis, unspecified etiology  Other orders -     predniSONE (STERAPRED UNI-PAK 21 TAB) 10 MG (21) TBPK tablet; As directed      Plan:     Call the office with any questions or concerns.

## 2017-12-08 ENCOUNTER — Ambulatory Visit (INDEPENDENT_AMBULATORY_CARE_PROVIDER_SITE_OTHER): Payer: BC Managed Care – PPO | Admitting: Internal Medicine

## 2017-12-08 ENCOUNTER — Encounter: Payer: Self-pay | Admitting: Internal Medicine

## 2017-12-08 VITALS — BP 128/58 | HR 93 | Temp 98.6°F | Ht 62.0 in | Wt 132.8 lb

## 2017-12-08 DIAGNOSIS — F419 Anxiety disorder, unspecified: Secondary | ICD-10-CM | POA: Diagnosis not present

## 2017-12-08 DIAGNOSIS — I471 Supraventricular tachycardia: Secondary | ICD-10-CM

## 2017-12-08 DIAGNOSIS — M5440 Lumbago with sciatica, unspecified side: Secondary | ICD-10-CM | POA: Diagnosis not present

## 2017-12-08 DIAGNOSIS — Z0000 Encounter for general adult medical examination without abnormal findings: Secondary | ICD-10-CM

## 2017-12-08 NOTE — Progress Notes (Signed)
Pre visit review using our clinic review tool, if applicable. No additional management support is needed unless otherwise documented below in the visit note. 

## 2017-12-08 NOTE — Progress Notes (Signed)
Patient ID: Sara Richardson, female   DOB: 12-25-1990, 27 y.o.   MRN: [id number]   Subjective:    Patient ID: Sara Richardson, female    DOB: 11/15/1990, 27 y.o.   MRN: [id number]  HPI  Patient here for her physical exam. She reports she is doing relatively well.  Has noticed some back discomfort.  Has had some issued with low back pain.  Has had intermittent issues for a while.  Played softball and volley ball in school.  Tries to stay active.  Notices increased spasms intermittently.  Notices if sits too long.  Is exercising.  Will feel pain down leg - into her lower legs (at times).  No numbness or tingling.  Had a massage approximately one month ago.  Seemed to worsen.  Has been told has sciatica.  Takes alleve prn.  Does not want to take increased amount of medication.  Saw gyn.  On progesterone only pill now - on since 6-05/2017.  Periods regular.  Was evaluated recently for sore throat.  Given prednisone.  Does not like to take prednisone.  Makes her heart race.  Overall heart doing well.     Past Medical History:  Diagnosis Date  . Anxiety   . SVT (supraventricular tachycardia) (HCC)    Past Surgical History:  Procedure Laterality Date  . NO PAST SURGERIES     Family History  Problem Relation Age of Onset  . Breast cancer Maternal Grandmother        lung cancer   . Prostate cancer Maternal Grandfather    Social History   Socioeconomic History  . Marital status: Single    Spouse name: None  . Number of children: None  . Years of education: None  . Highest education level: None  Social Needs  . Financial resource strain: None  . Food insecurity - worry: None  . Food insecurity - inability: None  . Transportation needs - medical: None  . Transportation needs - non-medical: None  Occupational History  . 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 Smoking status: Never Smoker  . Smokeless tobacco: Never Used  Substance and Sexual Activity  .  Alcohol use: No  . Drug use: No  . Sexual activity: Yes    Partners: Male    Birth control/protection: None, Condom  Other Topics Concern  . None  Social History Narrative  . None    Outpatient Encounter Medications as of 12/08/2017  Medication Sig  . LORazepam (ATIVAN) 0.5 MG tablet 1/2 - 1 tablet q day prn  . norethindrone (MICRONOR,CAMILA,ERRIN) 0.35 MG tablet Take 1 tablet (0.35 mg total) by mouth daily.  . [DISCONTINUED] medroxyPROGESTERone (DEPO-PROVERA) 150 MG/ML injection Inject 1 mL (150 mg total) into the muscle every 3 (three) months. (Patient not taking: Reported on 11/10/2017)  . [DISCONTINUED] predniSONE (STERAPRED UNI-PAK 21 TAB) 10 MG (21) TBPK tablet As directed   No facility-administered encounter medications on file as of 12/08/2017.     Review of Systems  Constitutional: Negative for appetite change and unexpected weight change.  HENT: Negative for congestion and sinus pressure.   Eyes: Negative for pain and visual disturbance.  Respiratory: Negative for cough, chest tightness and shortness of breath.   Cardiovascular: Negative for chest pain, palpitations and leg swelling.  Gastrointestinal: Negative for abdominal pain, diarrhea, nausea and vomiting.  Genitourinary: Negative for difficulty urinating and dysuria.  Musculoskeletal: Positive for back pain. Negative for joint swelling.  Skin:  Negative for color change and rash.  Neurological: Negative for dizziness, light-headedness and headaches.  Hematological: Negative for adenopathy. Does not bruise/bleed easily.  Psychiatric/Behavioral: Negative for agitation and dysphoric mood.       Objective:    Physical Exam  Constitutional: She is oriented to person, place, and time. She appears well-developed and well-nourished. No distress.  HENT:  Nose: Nose normal.  Mouth/Throat: Oropharynx is clear and moist.  Eyes: Right eye exhibits no discharge. Left eye exhibits no discharge. No scleral icterus.  Neck:  Neck supple. No thyromegaly present.  Cardiovascular: Normal rate and regular rhythm.  Pulmonary/Chest: Breath sounds normal. No accessory muscle usage. No tachypnea. No respiratory distress. She has no decreased breath sounds. She has no wheezes. She has no rhonchi. Right breast exhibits no inverted nipple, no mass, no nipple discharge and no tenderness (no axillary adenopathy). Left breast exhibits no inverted nipple, no mass, no nipple discharge and no tenderness (no axilarry adenopathy).  Abdominal: Soft. Bowel sounds are normal. There is no tenderness.  Musculoskeletal: She exhibits no edema or tenderness.  Lymphadenopathy:    She has no cervical adenopathy.  Neurological: She is alert and oriented to person, place, and time.  Skin: Skin is warm. No rash noted. No erythema.  Psychiatric: She has a normal mood and affect. Her behavior is normal.    BP (!) 128/58   Pulse 93   Temp 98.6 F (37 C) (Oral)   Ht 5\' 2"  (1.575 m)   Wt 132 lb 12.8 oz (60.2 kg)   LMP 11/21/2017 (Exact Date)   SpO2 99%   BMI 24.29 kg/m  Wt Readings from Last 3 Encounters:  12/08/17 132 lb 12.8 oz (60.2 kg)  12/01/17 128 lb (58.1 kg)  06/18/17 131 lb 11.2 oz (59.7 kg)     No results found for: WBC, HGB, HCT, PLT, GLUCOSE, CHOL, TRIG, HDL, LDLDIRECT, LDLCALC, ALT, AST, NA, K, CL, CREATININE, BUN, CO2, TSH, PSA, INR, GLUF, HGBA1C, MICROALBUR      Assessment & Plan:   Problem List Items Addressed This Visit    Anxiety    Stable.  Doing well.        Back pain    Persistent intermittent issue.  Worsened recently.  Massage worsened.  Does not want to take increased medication.  Hold on xray.  Discussed referral.  She agrees.  Will schedule to see Dr Tamala Julian.        Relevant Orders   Ambulatory referral to [facility] maintenance    Physical today 12/08/17.        SVT (supraventricular tachycardia) (HCC)    S/p ablation x 2.  Doing well.  Follow.         Other Visit Diagnoses     Routine general medical examination at a health care facility    -  Primary       Einar Pheasant, MD

## 2017-12-11 ENCOUNTER — Encounter: Payer: Self-pay | Admitting: Internal Medicine

## 2017-12-11 DIAGNOSIS — Z0000 Encounter for general adult medical examination without abnormal findings: Secondary | ICD-10-CM | POA: Insufficient documentation

## 2017-12-11 DIAGNOSIS — M549 Dorsalgia, unspecified: Secondary | ICD-10-CM | POA: Insufficient documentation

## 2017-12-11 NOTE — Assessment & Plan Note (Signed)
S/p ablation x 2.  Doing well.  Follow.

## 2017-12-11 NOTE — Assessment & Plan Note (Signed)
Stable  Doing well

## 2017-12-11 NOTE — Assessment & Plan Note (Signed)
Physical today 12/08/17.

## 2017-12-11 NOTE — Assessment & Plan Note (Signed)
Persistent intermittent issue.  Worsened recently.  Massage worsened.  Does not want to take increased medication.  Hold on xray.  Discussed referral.  She agrees.  Will schedule to see Dr Tamala Julian.

## 2017-12-14 ENCOUNTER — Other Ambulatory Visit: Payer: Self-pay | Admitting: Internal Medicine

## 2017-12-16 ENCOUNTER — Other Ambulatory Visit: Payer: Self-pay | Admitting: Internal Medicine

## 2017-12-16 NOTE — Telephone Encounter (Signed)
I have ok'd rx for lorazepam #30 with no refills.  Need to confirm no concern regarding pregnancy if takes.  Rarely uses.

## 2017-12-16 NOTE — Telephone Encounter (Signed)
Last OV 12/08/2017 Next OV 12/14/2018 Last refill 12/04/2016

## 2017-12-17 NOTE — Telephone Encounter (Signed)
Not able t o reach patient by phone and voicemail full.

## 2017-12-19 ENCOUNTER — Other Ambulatory Visit: Payer: Self-pay | Admitting: Internal Medicine

## 2017-12-19 NOTE — Telephone Encounter (Signed)
Rx has been faxed & pt notified.

## 2017-12-19 NOTE — Telephone Encounter (Signed)
Spoke with patient states on menses now .

## 2017-12-19 NOTE — Telephone Encounter (Signed)
Copied from [place]. Topic: Quick Communication - See Telephone Encounter >> Dec 19, 2017  4:10 PM Bea Graff, NT wrote: CRM for notification. See Telephone encounter for: Pt is at [facility] on [address] and they state that they have not received the rx for LORazepam (ATIVAN). They state it doesn't show its approved. She spoke to someone earlier and verified that she is not pregnant and that whoever she spoke to would call the pharmacy and clear that up, but the pharmacy has not heard anything. Pt is not pregnant. Please call pt once this is handled. She is leaving for the beach in 1 hour.   12/19/17.

## 2018-01-28 ENCOUNTER — Ambulatory Visit
Admission: RE | Admit: 2018-01-28 | Discharge: 2018-01-28 | Disposition: A | Discharge status: Home / Self Care | Payer: BC Managed Care – PPO | Source: Ambulatory Visit | Attending: Family Medicine | Admitting: Family Medicine

## 2018-01-28 ENCOUNTER — Ambulatory Visit (INDEPENDENT_AMBULATORY_CARE_PROVIDER_SITE_OTHER): Payer: BC Managed Care – PPO | Admitting: Family Medicine

## 2018-01-28 VITALS — BP 124/56 | Ht 62.0 in | Wt 130.0 lb

## 2018-01-28 DIAGNOSIS — G8929 Other chronic pain: Secondary | ICD-10-CM

## 2018-01-28 DIAGNOSIS — M545 Low back pain, unspecified: Secondary | ICD-10-CM

## 2018-01-28 NOTE — Patient Instructions (Signed)
Get the x-rays of your back at your convenience. I will call you with results and next steps. My concern is you have a possible spondylolysis or spondylolisthesis that's causing you to have chronic pain with flare-ups in the area. Treatment will depend on the x-ray results.

## 2018-01-29 ENCOUNTER — Encounter: Payer: Self-pay | Admitting: Family Medicine

## 2018-01-30 ENCOUNTER — Encounter: Payer: Self-pay | Admitting: Family Medicine

## 2018-01-30 NOTE — Progress Notes (Signed)
PCP and consultation requested by: Einar Pheasant, MD  Subjective:   HPI: Patient is a 27 y.o. female here for low back pain.  Patient reports that she has had low back pain off and on for about 10 years. She does not recall an acute injury but remembers this starting back in middle school she recalls having physical therapy and x-rays at that time. She states this flares up about 1-3 times a year usually takes a few days to resolve. She does yoga regularly and states this helps some. Her current episode started a few months ago but has been worsening Pain is sharp it can be up to a 10 out of 10. Denies any numbness or tingling. No bowel or bladder dysfunction. When the pain is severe she can feel pain into her legs to about the knees. She states lying on the couch or leaning back seems to make this worse.  Past Medical History:  Diagnosis Date  . Anxiety   . SVT (supraventricular tachycardia) ([place])     Current Outpatient Medications on File Prior to Visit  Medication Sig Dispense Refill  . LORazepam (ATIVAN) 0.5 MG tablet TAKE 1/2 TO 1 TABLET BY MOUTH EVERY DAY AS NEEDED 30 tablet 0  . norethindrone (MICRONOR,CAMILA,ERRIN) 0.35 MG tablet Take 1 tablet (0.35 mg total) by mouth daily. 3 Package 6   No current facility-administered medications on file prior to visit.     Past Surgical History:  Procedure Laterality Date  . NO PAST SURGERIES      No Known Allergies  Social History   Socioeconomic History  . Marital status: Married    Spouse name: Not on file  . Number of children: Not on file  . Years of education: Not on file  . Highest education level: Not on file  Occupational History  . Occupation: Pharmacist, hospital    CommentSports administrator - second grade  Social Needs  . Financial resource strain: Not on file  . Food insecurity:    Worry: Not on file    Inability: Not on file  . Transportation needs:    Medical: Not on file    Non-medical: Not on file  Tobacco Use  .  Smoking status: Never Smoker  . Smokeless tobacco: Never Used  Substance and Sexual Activity  . Alcohol use: No  . Drug use: No  . Sexual activity: Yes    Partners: Male    Birth control/protection: None, Condom  Lifestyle  . Physical activity:    Days per week: Not on file    Minutes per session: Not on file  . Stress: Not on file  Relationships  . Social connections:    Talks on phone: Not on file    Gets together: Not on file    Attends religious service: Not on file    Active member of club or organization: Not on file    Attends meetings of clubs or organizations: Not on file    Relationship status: Not on file  . Intimate partner violence:    Fear of current or ex partner: Not on file    Emotionally abused: Not on file    Physically abused: Not on file    Forced sexual activity: Not on file  Other Topics Concern  . Not on file  Social History Narrative  . Not on file    Family History  Problem Relation Age of Onset  . Breast cancer Maternal Grandmother        lung cancer   .  Prostate cancer Maternal Grandfather     BP (!) 124/56   Ht 5\' 2"  (1.575 m)   Wt 130 lb (59 kg)   LMP 01/14/2018   BMI 23.78 kg/m   Review of Systems: See HPI above.     Objective:  Physical Exam:  Gen: NAD, comfortable in exam room  Back: No gross deformity, scoliosis. TTP bilateral paraspinal lumbar regions.  No midline or bony TTP. FROM with pain worse on extension. Strength LEs 5/5 all muscle groups.   2+ MSRs in patellar and achilles tendons, equal bilaterally. Negative SLRs. Positive right stork's test. Sensation intact to light touch bilaterally.  Bilateral hips: No deformity. No tenderness to palpation. FROM without pain; 5/5 strength. NVI distally. Negative logroll bilateral hips Negative fabers and piriformis stretches.   Assessment & Plan:  1.  Low back pain: Chronic.  Radiographs were performed today and independently reviewed which showed no evidence of  acute bony abnormality or evidence of spondylolysis or spondylolisthesis.  We discussed her options and I do not think medications will help her a great deal.  Having said that I advised she can take Tylenol and/or Aleve as needed.  I encouraged her to do physical therapy with flexion based program.  We also discussed an MRI given the length that she has had these symptoms.  She stated she wished to speak to her husband and think about this and will call us back with how she would like to proceed.

## 2018-01-30 NOTE — Assessment & Plan Note (Signed)
Radiographs were performed today and independently reviewed which showed no evidence of acute bony abnormality or evidence of spondylolysis or spondylolisthesis.  We discussed her options and I do not think medications will help her a great deal.  Having said that I advised she can take Tylenol and/or Aleve as needed.  I encouraged her to do physical therapy with flexion based program.  We also discussed an MRI given the length that she has had these symptoms.  She stated she wished to speak to her husband and think about this and will call us back with how she would like to proceed.

## 2018-03-17 ENCOUNTER — Encounter: Payer: Self-pay | Admitting: Obstetrics and Gynecology

## 2018-04-24 ENCOUNTER — Encounter: Payer: Self-pay | Admitting: Obstetrics and Gynecology

## 2018-05-14 ENCOUNTER — Encounter: Payer: Self-pay | Admitting: Internal Medicine

## 2018-05-14 DIAGNOSIS — F439 Reaction to severe stress, unspecified: Secondary | ICD-10-CM

## 2018-05-14 DIAGNOSIS — F419 Anxiety disorder, unspecified: Secondary | ICD-10-CM

## 2018-05-16 NOTE — Telephone Encounter (Signed)
Ordered placed for psychiatry referral.

## 2018-06-26 ENCOUNTER — Encounter: Payer: Self-pay | Admitting: Obstetrics and Gynecology

## 2018-06-26 ENCOUNTER — Ambulatory Visit (INDEPENDENT_AMBULATORY_CARE_PROVIDER_SITE_OTHER): Payer: BC Managed Care – PPO | Admitting: Obstetrics and Gynecology

## 2018-06-26 VITALS — BP 124/84 | HR 77 | Ht 62.0 in | Wt 135.2 lb

## 2018-06-26 DIAGNOSIS — Z01419 Encounter for gynecological examination (general) (routine) without abnormal findings: Secondary | ICD-10-CM

## 2018-06-26 NOTE — Progress Notes (Signed)
  Subjective:     Mariska L Tamashiro is a married white 27 y.o. female and is here for a comprehensive physical exam. Is sexually active with female spouse.The patient reports problems - menses are irregular in timing..  Social History   Socioeconomic History  . Marital status: Married    Spouse name: Not on file  . Number of children: Not on file  . Years of education: Not on file  . Highest education level: Not on file  Occupational History  . Occupation: Pharmacist, hospital    CommentSports administrator - second grade  Social Needs  . Financial resource strain: Not on file  . Food insecurity:    Worry: Not on file    Inability: Not on file  . Transportation needs:    Medical: Not on file    Non-medical: Not on file  Tobacco Use  . Smoking status: Never Smoker  . Smokeless tobacco: Never Used  Substance and Sexual Activity  . Alcohol use: No  . Drug use: No  . Sexual activity: Yes    Partners: Male    Birth control/protection: None, Condom  Lifestyle  . Physical activity:    Days per week: Not on file    Minutes per session: Not on file  . Stress: Not on file  Relationships  . Social connections:    Talks on phone: Not on file    Gets together: Not on file    Attends religious service: Not on file    Active member of club or organization: Not on file    Attends meetings of clubs or organizations: Not on file    Relationship status: Not on file  . Intimate partner violence:    Fear of current or ex partner: Not on file    Emotionally abused: Not on file    Physically abused: Not on file    Forced sexual activity: Not on file  Other Topics Concern  . Not on file  Social History Narrative  . Not on file   Health Maintenance  Topic Date Due  . HIV Screening  07/11/2007  . TETANUS/TDAP  07/11/2011  . INFLUENZA VACCINE  06/11/2018  . PAP SMEAR  06/19/2020    The following portions of the patient's history were reviewed and updated as appropriate: allergies, current medications, past  family history, past medical history, past social history, past surgical history and problem list.  Review of Systems Pertinent items are noted in HPI.   Objective:    General appearance: alert, cooperative and appears stated age Neck: no adenopathy, no carotid bruit, no JVD, supple, symmetrical, trachea midline and thyroid not enlarged, symmetric, no tenderness/mass/nodules Lungs: clear to auscultation bilaterally Breasts: normal appearance, no masses or tenderness Heart: regular rate and rhythm, S1, S2 normal, no murmur, click, rub or gallop Abdomen: soft, non-tender; bowel sounds normal; no masses,  no organomegaly Pelvic: cervix normal in appearance, external genitalia normal, no adnexal masses or tenderness, no cervical motion tenderness, rectovaginal septum normal, uterus normal size, shape, and consistency and vagina normal without discharge    Assessment:    Healthy female exam.      Plan:  Will stop OCPs for now and continue condom use. RTC 1 year or as needed.  Nyron Mozer,CNM   See After Visit Summary for Counseling Recommendations

## 2018-06-26 NOTE — Patient Instructions (Signed)
Preventive Care 18-39 Years, Female Preventive care refers to lifestyle choices and visits with your health care provider that can promote health and wellness. What does preventive care include?  A yearly physical exam. This is also called an annual well check.  Dental exams once or twice a year.  Routine eye exams. Ask your health care provider how often you should have your eyes checked.  Personal lifestyle choices, including: ? Daily care of your teeth and gums. ? Regular physical activity. ? Eating a healthy diet. ? Avoiding tobacco and drug use. ? Limiting alcohol use. ? Practicing safe sex. ? Taking vitamin and mineral supplements as recommended by your health care provider. What happens during an annual well check? The services and screenings done by your health care provider during your annual well check will depend on your age, overall health, lifestyle risk factors, and family history of disease. Counseling Your health care provider may ask you questions about your:  Alcohol use.  Tobacco use.  Drug use.  Emotional well-being.  Home and relationship well-being.  Sexual activity.  Eating habits.  Work and work Statistician.  Method of birth control.  Menstrual cycle.  Pregnancy history.  Screening You may have the following tests or measurements:  Height, weight, and BMI.  Diabetes screening. This is done by checking your blood sugar (glucose) after you have not eaten for a while (fasting).  Blood pressure.  Lipid and cholesterol levels. These may be checked every 5 years starting at age 38.  Skin check.  Hepatitis C blood test.  Hepatitis B blood test.  Sexually transmitted disease (STD) testing.  BRCA-related cancer screening. This may be done if you have a family history of breast, ovarian, tubal, or peritoneal cancers.  Pelvic exam and Pap test. This may be done every 3 years starting at age 38. Starting at age 30, this may be done  every 5 years if you have a Pap test in combination with an HPV test.  Discuss your test results, treatment options, and if necessary, the need for more tests with your health care provider. Vaccines Your health care provider may recommend certain vaccines, such as:  Influenza vaccine. This is recommended every year.  Tetanus, diphtheria, and acellular pertussis (Tdap, Td) vaccine. You may need a Td booster every 10 years.  Varicella vaccine. You may need this if you have not been vaccinated.  HPV vaccine. If you are 39 or younger, you may need three doses over 6 months.  Measles, mumps, and rubella (MMR) vaccine. You may need at least one dose of MMR. You may also need a second dose.  Pneumococcal 13-valent conjugate (PCV13) vaccine. You may need this if you have certain conditions and were not previously vaccinated.  Pneumococcal polysaccharide (PPSV23) vaccine. You may need one or two doses if you smoke cigarettes or if you have certain conditions.  Meningococcal vaccine. One dose is recommended if you are age 68-21 years and a first-year college student living in a residence hall, or if you have one of several medical conditions. You may also need additional booster doses.  Hepatitis A vaccine. You may need this if you have certain conditions or if you travel or work in places where you may be exposed to hepatitis A.  Hepatitis B vaccine. You may need this if you have certain conditions or if you travel or work in places where you may be exposed to hepatitis B.  Haemophilus influenzae type b (Hib) vaccine. You may need this  if you have certain risk factors.  Talk to your health care provider about which screenings and vaccines you need and how often you need them. This information is not intended to replace advice given to you by your health care provider. Make sure you discuss any questions you have with your health care provider. Document Released: 12/24/2001 Document Revised:  07/17/2016 Document Reviewed: 08/29/2015 Elsevier Interactive Patient Education  2018 Elsevier Inc.  

## 2018-08-28 ENCOUNTER — Other Ambulatory Visit: Payer: Self-pay | Admitting: Obstetrics and Gynecology

## 2018-12-03 ENCOUNTER — Ambulatory Visit: Payer: Self-pay

## 2018-12-03 ENCOUNTER — Telehealth: Payer: Self-pay | Admitting: Family Medicine

## 2018-12-03 ENCOUNTER — Encounter: Payer: Self-pay | Admitting: Family Medicine

## 2018-12-03 ENCOUNTER — Ambulatory Visit: Payer: BC Managed Care – PPO | Admitting: Family Medicine

## 2018-12-03 VITALS — BP 108/62 | HR 106 | Temp 98.2°F | Resp 16 | Ht 62.0 in | Wt 132.0 lb

## 2018-12-03 DIAGNOSIS — N3001 Acute cystitis with hematuria: Secondary | ICD-10-CM | POA: Diagnosis not present

## 2018-12-03 LAB — POCT URINALYSIS DIPSTICK
Bilirubin, UA: NEGATIVE
Blood, UA: 3
Glucose, UA: NEGATIVE
KETONES UA: 5
Protein, UA: POSITIVE — AB
Spec Grav, UA: 1.025 (ref 1.010–1.025)
UROBILINOGEN UA: 1 U/dL
pH, UA: 7 (ref 5.0–8.0)

## 2018-12-03 LAB — POCT URINE PREGNANCY: PREG TEST UR: NEGATIVE

## 2018-12-03 MED ORDER — SULFAMETHOXAZOLE-TRIMETHOPRIM 800-160 MG PO TABS: 1.0000 | ORAL_TABLET | Freq: Two times a day (BID) | ORAL | 0 refills | Status: AC

## 2018-12-03 MED ORDER — CEFTRIAXONE SODIUM 1 G IJ SOLR
1.0000 g | Freq: Once | INTRAMUSCULAR | Status: AC
  Administered 2018-12-03: 1 g via INTRAMUSCULAR

## 2018-12-03 NOTE — Telephone Encounter (Signed)
Ihave cancelled appointment for 12/25/18. Patient sees Dr. Nicki Reaper 12/14/18 for ger yearly physical & she will see her then.

## 2018-12-03 NOTE — Telephone Encounter (Signed)
Patient returning call and states that before she left the office this morning, she scheduled a 4 week follow up with Dr Nicki Reaper. Patient would like to know if these appointment is needed or if she can keep it and do a urine sample at that visit? Please advise.

## 2018-12-03 NOTE — Telephone Encounter (Signed)
Pt. Reports she started having urgency and blood in urine last night. Has burning and cramping as well. Appointment made for this morning.  Reason for Disposition . Urinating more frequently than usual (i.e., frequency)  Answer Assessment - Initial Assessment Questions 1. SYMPTOM: "What's the main symptom you're concerned about?" (e.g., frequency, incontinence)     Blood in urine 2. ONSET: "When did the  blood  start?"     Last night 3. PAIN: "Is there any pain?" If so, ask: "How bad is it?" (Scale: 1-10; mild, moderate, severe)     Burning and cramping 4. CAUSE: "What do you think is causing the symptoms?"     Infection 5. OTHER SYMPTOMS: "Do you have any other symptoms?" (e.g., fever, flank pain, blood in urine, pain with urination)     No 6. PREGNANCY: "Is there any chance you are pregnant?" "When was your last menstrual period?"     No  Protocols used: URINARY [facility]

## 2018-12-03 NOTE — Telephone Encounter (Signed)
Noted  

## 2018-12-03 NOTE — Telephone Encounter (Signed)
Called Pt to tell her Please have her schedule a lab visit in 3-4 weeks to recheck her urine after UTI treatment done.  Thanks!  Okay for Pec to speak to Pt

## 2018-12-03 NOTE — Patient Instructions (Signed)
Urinary Tract Infection, Adult A urinary tract infection (UTI) is an infection of any part of the urinary tract. The urinary tract includes:  The kidneys.  The ureters.  The bladder.  The urethra. These organs make, store, and get rid of pee (urine) in the body. What are the causes? This is caused by germs (bacteria) in your genital area. These germs grow and cause swelling (inflammation) of your urinary tract. What increases the risk? You are more likely to develop this condition if:  You have a small, thin tube (catheter) to drain pee.  You cannot control when you pee or poop (incontinence).  You are female, and: ? You use these methods to prevent pregnancy: ? A medicine that kills sperm (spermicide). ? A device that blocks sperm (diaphragm). ? You have low levels of a female hormone (estrogen). ? You are pregnant.  You have genes that add to your risk.  You are sexually active.  You take antibiotic medicines.  You have trouble peeing because of: ? A prostate that is bigger than normal, if you are female. ? A blockage in the part of your body that drains pee from the bladder (urethra). ? A kidney stone. ? A nerve condition that affects your bladder (neurogenic bladder). ? Not getting enough to drink. ? Not peeing often enough.  You have other conditions, such as: ? Diabetes. ? A weak disease-fighting system (immune system). ? Sickle cell disease. ? Gout. ? Injury of the spine. What are the signs or symptoms? Symptoms of this condition include:  Needing to pee right away (urgently).  Peeing often.  Peeing small amounts often.  Pain or burning when peeing.  Blood in the pee.  Pee that smells bad or not like normal.  Trouble peeing.  Pee that is cloudy.  Fluid coming from the vagina, if you are female.  Pain in the belly or lower back. Other symptoms include:  Throwing up (vomiting).  No urge to eat.  Feeling mixed up (confused).  Being tired  and grouchy (irritable).  A fever.  Watery poop (diarrhea). How is this treated? This condition may be treated with:  Antibiotic medicine.  Other medicines.  Drinking enough water. Follow these instructions at home:  Medicines  Take over-the-counter and prescription medicines only as told by your doctor.  If you were prescribed an antibiotic medicine, take it as told by your doctor. Do not stop taking it even if you start to feel better. General instructions  Make sure you: ? Pee until your bladder is empty. ? Do not hold pee for a long time. ? Empty your bladder after sex. ? Wipe from front to back after pooping if you are a female. Use each tissue one time when you wipe.  Drink enough fluid to keep your pee pale yellow.  Keep all follow-up visits as told by your doctor. This is important. Contact a doctor if:  You do not get better after 1-2 days.  Your symptoms go away and then come back. Get help right away if:  You have very bad back pain.  You have very bad pain in your lower belly.  You have a fever.  You are sick to your stomach (nauseous).  You are throwing up. Summary  A urinary tract infection (UTI) is an infection of any part of the urinary tract.  This condition is caused by germs in your genital area.  There are many risk factors for a UTI. These include having a small, thin   tube to drain pee and not being able to control when you pee or poop.  Treatment includes antibiotic medicines for germs.  Drink enough fluid to keep your pee pale yellow. This information is not intended to replace advice given to you by your health care provider. Make sure you discuss any questions you have with your health care provider. Document Released: 04/15/2008 Document Revised: 05/07/2018 Document Reviewed: 05/07/2018 Elsevier Interactive Patient Education  2019 Elsevier Inc.  

## 2018-12-03 NOTE — Progress Notes (Signed)
   Subjective:    Patient ID: Sara Richardson, female    DOB: Feb 17, 1991, 28 y.o.   MRN: [id number]  HPI  Patient presents to clinic complaining of burning with urination, lower abdominal cramping and dark-colored urine and blood in urine that began 2 days ago.  Patient states last night she noticed a burning and cramping more so.  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  Denies fever or chills.  Denies nausea/vomiting or diarrhea.  Denies concern for STD.  Patient Active Problem List   Diagnosis Date Noted  . Back pain 12/11/2017  . SVT (supraventricular tachycardia) ([place]) 12/01/2016  . Anxiety 12/01/2016  . Menstrual cramps 12/01/2016  . Hx of prior ablation treatment 06/26/2013   Social History   Tobacco Use  . Smoking status: Never Smoker  . Smokeless tobacco: Never Used  Substance Use Topics  . Alcohol use: No   Review of Systems  Constitutional: Negative for chills, fatigue and fever.  HENT: Negative for congestion, ear pain, sinus pain and sore throat.   Eyes: Negative.   Respiratory: Negative for cough, shortness of breath and wheezing.   Cardiovascular: Negative for chest pain, palpitations and leg swelling.  Gastrointestinal: Negative for abdominal pain, diarrhea, nausea and vomiting.  Genitourinary: +dysuria, frequency and urgency.  Musculoskeletal: Negative for arthralgias and myalgias.  Skin: Negative for color change, pallor and rash.  Neurological: Negative for syncope, light-headedness and headaches.  Psychiatric/Behavioral: The patient is not nervous/anxious.       Objective:   Physical Exam  Constitutional: She appears well-developed and well-nourished. No distress.  HENT:  Head: Normocephalic and atraumatic.  Eyes: Pupils are equal, round, and reactive to light. EOM are normal. No scleral icterus.  Neck: Normal range of motion. Neck supple. No  tracheal deviation present.  Cardiovascular: Normal rate, regular rhythm and normal heart sounds.  Pulmonary/Chest: Effort normal and breath sounds normal. No respiratory distress.  ABD: +suprapubic tenderness. No CVA tenderness.   Neurological: She is alert and oriented to person, place, and time.  Gait normal  Skin: Skin is warm and dry. No pallor.  Psychiatric: She has a normal mood and affect. Her behavior is normal. Thought content normal.  Nursing note and vitals reviewed.   Vitals:   12/03/18 0848  BP: 108/62  Pulse: (!) 106  Resp: 16  Temp: 98.2 F (36.8 C)  SpO2: 99%      Assessment & Plan:   Urinary tract infection with hematuria - patient's urinalysis is positive for both leukocytes, nitrites as well as blood.  We will treat for UTI with 1 g IM Rocephin in clinic and also Bactrim twice daily.  Patient has been advised to increase water intake, avoid holding urine for extended periods of time, wear cotton underwear, always wipe front to back after using restroom and avoiding too many drinks that contain excess amounts of caffeine or sugar.  Patient will be contacted with results of urine culture and she will return to clinic in approximately 4 weeks for urine recheck to be sure blood has resolved.

## 2018-12-03 NOTE — Telephone Encounter (Signed)
I just want her to do a urine recheck in 4 weeks, so unless she wants to see Dr Nicki Reaper she can just do a lab visit  Thanks,  LG

## 2018-12-06 LAB — URINE CULTURE
MICRO NUMBER:: 95255
SPECIMEN QUALITY:: ADEQUATE

## 2018-12-12 ENCOUNTER — Encounter: Payer: Self-pay | Admitting: Internal Medicine

## 2018-12-14 ENCOUNTER — Ambulatory Visit (INDEPENDENT_AMBULATORY_CARE_PROVIDER_SITE_OTHER): Payer: BC Managed Care – PPO | Admitting: Internal Medicine

## 2018-12-14 ENCOUNTER — Encounter: Payer: Self-pay | Admitting: Internal Medicine

## 2018-12-14 VITALS — BP 116/70 | HR 100 | Temp 98.3°F | Resp 16 | Ht 62.0 in | Wt 132.2 lb

## 2018-12-14 DIAGNOSIS — R350 Frequency of micturition: Secondary | ICD-10-CM

## 2018-12-14 DIAGNOSIS — F419 Anxiety disorder, unspecified: Secondary | ICD-10-CM

## 2018-12-14 DIAGNOSIS — Z0000 Encounter for general adult medical examination without abnormal findings: Secondary | ICD-10-CM

## 2018-12-14 DIAGNOSIS — N3001 Acute cystitis with hematuria: Secondary | ICD-10-CM

## 2018-12-14 DIAGNOSIS — I471 Supraventricular tachycardia: Secondary | ICD-10-CM

## 2018-12-14 NOTE — Patient Instructions (Signed)
Examples of probiotics:  Florastor, culturelle or align

## 2018-12-14 NOTE — Telephone Encounter (Signed)
Called pt to let her know that we can test her urine when she comes in today but for future reference, the doctors do not get their my chart messages over the weekend. Patient gave verbal understanding

## 2018-12-14 NOTE — Progress Notes (Addendum)
Patient ID: Sara Richardson, female   DOB: August 30, 1991, 28 y.o.   MRN: [id number]   Subjective:    Patient ID: Sara Richardson, female    DOB: Apr 15, 1991, 28 y.o.   MRN: [id number]  HPI  Patient here for a scheduled physical exam.  She sees gyn for breast and pelvic exams.  States she is doing relatively well.  Was seen recently and diagnosed with UTI.  Treated.  Symptoms improved.  Unsure if clear.  Some questionable sensation change.  Feels better.  Had some constipation with abx.  Resolved now.  No chest pain.  No sob.  No cough or congestion.  No acid reflux.  No abdominal pain.  Bowels moving.  Off OCPs.  Periods more regular.  Not actively trying to get pregnant, but ok if gets pregnant.  Some increased anxiety at times.  Feels may need to have something to take if needed.  Has clonazepam.  Rarely uses.  Would like something else that she could use if gets pregnant.     Past Medical History:  Diagnosis Date  . Anxiety   . SVT (supraventricular tachycardia) ([facility])    Past Surgical History:  Procedure Laterality Date  . NO PAST SURGERIES     Family History  Problem Relation Age of Onset  . Breast cancer Maternal Grandmother        lung cancer   . Prostate cancer Maternal Grandfather    Social History   Socioeconomic History  . Marital status: Married    Spouse name: Not on file  . Number of children: Not on file  . Years of education: Not on file  . Highest education level: Not on file  Occupational History  . Occupation: Pharmacist, hospital    CommentSports administrator - second grade  Social Needs  . Financial resource strain: Not on file  . Food insecurity:    Worry: Not on file    Inability: Not on file  . Transportation needs:    Medical: Not on file    Non-medical: Not on file  Tobacco Use  . Smoking status: Never Smoker  . Smokeless tobacco: Never Used  Substance and Sexual Activity  . Alcohol use: No  . Drug use: No  . Sexual activity: Yes    Partners: Male    Birth  control/protection: None, Condom  Lifestyle  . Physical activity:    Days per week: Not on file    Minutes per session: Not on file  . Stress: Not on file  Relationships  . Social connections:    Talks on phone: Not on file    Gets together: Not on file    Attends religious service: Not on file    Active member of club or organization: Not on file    Attends meetings of clubs or organizations: Not on file    Relationship status: Not on file  Other Topics Concern  . Not on file  Social History Narrative  . Not on file    Outpatient Encounter Medications as of 12/14/2018  Medication Sig  . clonazePAM (KLONOPIN) 0.5 MG tablet TAKE 1 TABLET BY MOUTH EVERY DAY AS NEEDED --MUST LAST 30 DAYS--STOP LORAZEPAM  . [DISCONTINUED] LORazepam (ATIVAN) 0.5 MG tablet TAKE 1/2 TO 1 TABLET BY MOUTH EVERY DAY AS NEEDED  . [DISCONTINUED] norethindrone (MICRONOR,CAMILA,ERRIN) 0.35 MG tablet Take 1 tablet (0.35 mg total) by mouth daily.   No facility-administered encounter medications on file as of 12/14/2018.     Review of Systems  Constitutional: Negative for appetite change and unexpected weight change.  HENT: Negative for congestion and sinus pressure.   Eyes: Negative for pain and visual disturbance.  Respiratory: Negative for cough, chest tightness and shortness of breath.   Cardiovascular: Negative for chest pain, palpitations and leg swelling.  Gastrointestinal: Negative for abdominal pain, diarrhea, nausea and vomiting.  Genitourinary: Negative for difficulty urinating and dysuria.  Musculoskeletal: Negative for joint swelling and myalgias.  Skin: Negative for color change and rash.  Neurological: Negative for dizziness, light-headedness and headaches.  Hematological: Negative for adenopathy. Does not bruise/bleed easily.  Psychiatric/Behavioral: Negative for agitation and dysphoric mood.       Objective:    Physical Exam Constitutional:      General: She is not in acute distress.     Appearance: Normal appearance.  HENT:     Nose: Nose normal. No congestion.     Mouth/Throat:     Pharynx: No oropharyngeal exudate or posterior oropharyngeal erythema.  Neck:     Musculoskeletal: Neck supple. No muscular tenderness.     Thyroid: No thyromegaly.  Cardiovascular:     Rate and Rhythm: Normal rate and regular rhythm.  Pulmonary:     Effort: No respiratory distress.     Breath sounds: Normal breath sounds. No wheezing.  Abdominal:     General: Bowel sounds are normal.     Palpations: Abdomen is soft.     Tenderness: There is no abdominal tenderness.  Musculoskeletal:        General: No swelling or tenderness.  Lymphadenopathy:     Cervical: No cervical adenopathy.  Skin:    Findings: No erythema or rash.  Neurological:     Mental Status: She is alert.  Psychiatric:        Mood and Affect: Mood normal.        Behavior: Behavior normal.     BP 116/70 (BP Location: Left Arm, Patient Position: Sitting, Cuff Size: Normal)   Pulse 100   Temp 98.3 F (36.8 C) (Oral)   Resp 16   Ht 5\' 2"  (1.575 m)   Wt 132 lb 3.2 oz (60 kg)   LMP 11/18/2018 (Exact Date)   SpO2 99%   BMI 24.18 kg/m  Wt Readings from Last 3 Encounters:  12/14/18 132 lb 3.2 oz (60 kg)  12/03/18 132 lb (59.9 kg)  06/26/18 135 lb 3.2 oz (61.3 kg)    Dg Lumbar Spine Complete  Result Date: 01/29/2018 CLINICAL DATA:  Chronic back pain. Pain radiates down both legs. No known injury. EXAM: LUMBAR SPINE - COMPLETE 4+ VIEW COMPARISON:  No prior. FINDINGS: Mild scoliosis concave right. No acute bony abnormality. No focal bony abnormality. Normal alignment and mineralization. IMPRESSION: Mild scoliosis concave right.  No acute or focal abnormality. Electronically Signed   By: Marcello Moores  Register   On: 01/29/2018 10:25       Assessment & Plan:   Problem List Items Addressed This Visit    Anxiety    Overall doing relatively well.  Does still have some increased anxiety at times.  Has clonazepam.  Rarely  uses.  Would like to have something different that she could use if needed (if becomes pregnant).  Will d/w gyn.       SVT (supraventricular tachycardia) ([facility])    S/p ablation x 2.  Stable.  Follow.         Other Visit Diagnoses    Routine general medical examination at a health care facility    -  Primary   Acute cystitis with hematuria       Check urine to confirm blood clears.     Urinary frequency       Check urine to confirm no infection.     Relevant Orders   Urine Culture (Completed)       Einar Pheasant, MD

## 2018-12-15 ENCOUNTER — Encounter: Payer: Self-pay | Admitting: Internal Medicine

## 2018-12-15 ENCOUNTER — Telehealth: Payer: Self-pay | Admitting: Internal Medicine

## 2018-12-15 LAB — URINE CULTURE
MICRO NUMBER: 141737
SPECIMEN QUALITY:: ADEQUATE

## 2018-12-15 LAB — URINALYSIS, ROUTINE W REFLEX MICROSCOPIC
Bilirubin Urine: NEGATIVE
Hgb urine dipstick: NEGATIVE
Ketones, ur: NEGATIVE
LEUKOCYTES UA: NEGATIVE
Nitrite: NEGATIVE
RBC / HPF: NONE SEEN (ref 0–?)
Specific Gravity, Urine: 1.01 (ref 1.000–1.030)
Total Protein, Urine: NEGATIVE
Urine Glucose: NEGATIVE
Urobilinogen, UA: 0.2 (ref 0.0–1.0)
WBC UA: NONE SEEN (ref 0–?)
pH: 7 (ref 5.0–8.0)

## 2018-12-15 NOTE — Telephone Encounter (Signed)
My chart message sent to pt for update.   

## 2018-12-20 NOTE — Assessment & Plan Note (Signed)
S/p ablation x 2.  Stable.  Follow.

## 2018-12-20 NOTE — Assessment & Plan Note (Addendum)
Overall doing relatively well.  Does still have some increased anxiety at times.  Has clonazepam.  Rarely uses.  Would like to have something different that she could use if needed (if becomes pregnant).  Will d/w gyn.

## 2018-12-21 ENCOUNTER — Telehealth: Payer: Self-pay

## 2018-12-21 NOTE — Telephone Encounter (Signed)
Copied from [place]. Topic: General - Other >> Dec 21, 2018 10:04 AM Judyann Munson wrote: Reason for CRM: Patient is calling to state she sent images over too Dr.scott in regards to a rash she wanting a call back with what is the next step for her. Please advise

## 2018-12-21 NOTE — Telephone Encounter (Signed)
See my chart message with provider

## 2018-12-25 ENCOUNTER — Ambulatory Visit: Payer: BC Managed Care – PPO | Admitting: Internal Medicine

## 2018-12-29 ENCOUNTER — Encounter: Payer: Self-pay | Admitting: Internal Medicine

## 2018-12-29 DIAGNOSIS — N926 Irregular menstruation, unspecified: Secondary | ICD-10-CM

## 2018-12-30 NOTE — Telephone Encounter (Signed)
Pt scheduled for labs on Friday. Aware that rx will be sent in pending results

## 2018-12-30 NOTE — Telephone Encounter (Signed)
I have placed the order for the blood pregnancy test.  Can schedule lab appt to come in and have drawn.  Since she is coming in soon, would recommend check this prior to buspar rx being sent in - if ok

## 2019-01-01 ENCOUNTER — Other Ambulatory Visit (INDEPENDENT_AMBULATORY_CARE_PROVIDER_SITE_OTHER): Payer: BC Managed Care – PPO

## 2019-01-01 DIAGNOSIS — N926 Irregular menstruation, unspecified: Secondary | ICD-10-CM | POA: Diagnosis not present

## 2019-01-01 LAB — HCG, QUANTITATIVE, PREGNANCY: Quantitative HCG: <0.6 m[IU]/mL

## 2019-01-03 ENCOUNTER — Encounter: Payer: Self-pay | Admitting: Internal Medicine

## 2019-01-03 ENCOUNTER — Other Ambulatory Visit: Payer: Self-pay | Admitting: Internal Medicine

## 2019-01-03 MED ORDER — BUSPIRONE HCL 5 MG PO TABS: 5.0000 mg | ORAL_TABLET | Freq: Every day | ORAL | 0 refills | Status: DC | PRN

## 2019-01-03 NOTE — Progress Notes (Signed)
rx sent to pharmacy for buspar #30 with no refills.

## 2019-01-04 ENCOUNTER — Telehealth: Payer: Self-pay | Admitting: Internal Medicine

## 2019-01-04 NOTE — Telephone Encounter (Signed)
Spoke with patient. She says she was told that her results would be in by end of day. She is very stressed out. Advised that results are not normally resulted within the same day unless something comes back abnormal or alarming. Patient understood and apologized. Also advised that Dr. Nicki Reaper normally results her labs through my chart unless there is a circumstance where they need to be called. Patient stated that it was okay to continue getting labs through my chart. Also advised that Dr. Nicki Reaper has been out of the office since Thursday.

## 2019-01-04 NOTE — Telephone Encounter (Signed)
Copied from [place] [id number]. Topic: Quick Communication - See Telephone Encounter >> Jan 04, 2019  1:11 PM Burchel, Abbi R wrote: CRM for notification. See Telephone encounter for: 01/04/19.  Pt requesting a call back to discuss labs from 01/01/2019.  Please call pt: [phone]

## 2019-01-04 NOTE — Telephone Encounter (Signed)
Patient called and asks if something else could be going on that will cause her not to have a period. She says her stress level is high and that is probably not helping. She voiced concerns of the communication as far as receiving her lab results. I advised I will send this question about her period to Dr. Nicki Reaper and someone will call with Dr. Bary Leriche recommendation or advice. Patient verbalized understanding.

## 2019-01-12 NOTE — Telephone Encounter (Signed)
Spoke with patient. She says she does not remember when her last normal bowel movement was. She felt like she needed to go this morning but could not and then a little later had a small bowel movement. Still feels like she needs to go. She is still spotting a little bit but has not started bleeding like she normally does on her cycle. She denied having any other symptoms. She stated that she usually does cramp when its time for her period but she has been having persistent left lower abdominal pain that has not went away all day. Advised that she should be seen. Scheduled with Lauren on Thursday but advised that if symptoms worsen or if she develops any new symptoms, she should be seen sooner.

## 2019-01-12 NOTE — Telephone Encounter (Signed)
Spoke to [place].  She discussed with her regarding monitoring bowel movements and things to do to help bowels move. Will be evaluated more acutely if symptoms worsen and call if any problems.

## 2019-01-12 NOTE — Telephone Encounter (Signed)
Need a little more information.  See her my chart message. Is she having bowel movements?  Any other symptoms?  States she started her period.  If persistent pain, will need to be evaluated to determine etiology.

## 2019-01-14 ENCOUNTER — Encounter: Payer: Self-pay | Admitting: Family Medicine

## 2019-01-14 ENCOUNTER — Ambulatory Visit: Payer: BC Managed Care – PPO | Admitting: Family Medicine

## 2019-01-14 VITALS — BP 100/60 | HR 99 | Temp 97.8°F | Resp 16 | Ht 62.0 in | Wt 130.2 lb

## 2019-01-14 DIAGNOSIS — R1032 Left lower quadrant pain: Secondary | ICD-10-CM

## 2019-01-14 DIAGNOSIS — N946 Dysmenorrhea, unspecified: Secondary | ICD-10-CM

## 2019-01-14 LAB — COMPREHENSIVE METABOLIC PANEL
ALT: 11 U/L (ref 0–35)
AST: 16 U/L (ref 0–37)
Albumin: 4.6 g/dL (ref 3.5–5.2)
Alkaline Phosphatase: 35 U/L — ABNORMAL LOW (ref 39–117)
BUN: 7 mg/dL (ref 6–23)
CO2: 26 meq/L (ref 19–32)
Calcium: 9.3 mg/dL (ref 8.4–10.5)
Chloride: 104 mEq/L (ref 96–112)
Creatinine, Ser: 0.71 mg/dL (ref 0.40–1.20)
GFR: 99.11 mL/min (ref >60.00)
Glucose, Bld: 91 mg/dL (ref 70–99)
Potassium: 3.3 mEq/L — ABNORMAL LOW (ref 3.5–5.1)
SODIUM: 138 meq/L (ref 135–145)
Total Bilirubin: 0.3 mg/dL (ref 0.2–1.2)
Total Protein: 7.3 g/dL (ref 6.0–8.3)

## 2019-01-14 LAB — CBC WITH DIFFERENTIAL/PLATELET
Basophils Absolute: 0 10*3/uL (ref 0.0–0.1)
Basophils Relative: 0.6 % (ref 0.0–3.0)
Eosinophils Absolute: 0.1 10*3/uL (ref 0.0–0.7)
Eosinophils Relative: 1.1 % (ref 0.0–5.0)
HCT: 41.6 % (ref 36.0–46.0)
Hemoglobin: 14.1 g/dL (ref 12.0–15.0)
LYMPHS ABS: 2.2 10*3/uL (ref 0.7–4.0)
Lymphocytes Relative: 26.8 % (ref 12.0–46.0)
MCHC: 34 g/dL (ref 30.0–36.0)
MCV: 85 fl (ref 78.0–100.0)
MONO ABS: 0.6 10*3/uL (ref 0.1–1.0)
Monocytes Relative: 7.7 % (ref 3.0–12.0)
Neutro Abs: 5.2 10*3/uL (ref 1.4–7.7)
Neutrophils Relative %: 63.8 % (ref 43.0–77.0)
Platelets: 297 10*3/uL (ref 150.0–400.0)
RBC: 4.9 Mil/uL (ref 3.87–5.11)
RDW: 13.1 % (ref 11.5–15.5)
WBC: 8.2 10*3/uL (ref 4.0–10.5)

## 2019-01-14 LAB — POCT URINALYSIS DIPSTICK
Bilirubin, UA: NEGATIVE
GLUCOSE UA: NEGATIVE
Ketones, UA: NEGATIVE
Leukocytes, UA: NEGATIVE
Nitrite, UA: NEGATIVE
Protein, UA: NEGATIVE
Spec Grav, UA: 1.02 (ref 1.010–1.025)
Urobilinogen, UA: 0.2 E.U./dL
pH, UA: 6.5 (ref 5.0–8.0)

## 2019-01-14 LAB — POCT URINE PREGNANCY: Preg Test, Ur: NEGATIVE

## 2019-01-14 NOTE — Progress Notes (Signed)
Subjective:    Patient ID: Sara Richardson, female    DOB: 1991-03-03, 28 y.o.   MRN: [id number]  HPI   Patient resents clinic complaining of left-sided abdominal pain that began 2 days ago.  Patient states on the first day the pain presented, and felt sharp and like it was radiating left lower quadrant, and to left low back.  Patient states she did take Tylenol.  Describes the pain today as a dull pain.  She also started her menstrual cycle yesterday.  States her menses was later than usual this month, patient is always regular.  Patient also states she does have painful menstrual cycles, and has had this for years.  Patient states she did try one oral birth control brand in the past, but felt that it made everything worse so has not been on a birth control since.  Patient did have a UTI in January 2020, this resolved with antibiotic treatment.  Patient has no known history of ovarian cysts.  Patient Active Problem List   Diagnosis Date Noted  . Back pain 12/11/2017  . SVT (supraventricular tachycardia) ([place]) 12/01/2016  . Anxiety 12/01/2016  . Menstrual cramps 12/01/2016  . Hx of prior ablation treatment 06/26/2013   Social History   Tobacco Use  . Smoking status: Never Smoker  . Smokeless tobacco: Never Used  Substance Use Topics  . Alcohol use: No   Review of Systems   Constitutional: Negative for chills, fatigue and fever.  HENT: Negative for congestion, ear pain, sinus pain and sore throat.   Eyes: Negative.   Respiratory: Negative for cough, shortness of breath and wheezing.   Cardiovascular: Negative for chest pain, palpitations and leg swelling.  Gastrointestinal: No diarrhea, nausea and vomiting. +LLQ ABD pain Genitourinary: Negative for dysuria, frequency and urgency.  Musculoskeletal: Negative for arthralgias and myalgias.  Skin: Negative for color change, pallor and rash.  Neurological: Negative for syncope, light-headedness and headaches.    Psychiatric/Behavioral: The patient is not nervous/anxious.       Objective:   Physical Exam Vitals signs and nursing note reviewed.  Constitutional:      General: She is not in acute distress.    Appearance: She is not toxic-appearing.  HENT:     Head: Normocephalic and atraumatic.  Cardiovascular:     Rate and Rhythm: Normal rate and regular rhythm.  Pulmonary:     Effort: Pulmonary effort is normal. No respiratory distress.     Breath sounds: Normal breath sounds. No wheezing, rhonchi or rales.  Abdominal:     General: Abdomen is flat. Bowel sounds are normal. There is no distension.     Palpations: Abdomen is soft.     Tenderness: There is abdominal tenderness in the left lower quadrant. There is no guarding or rebound.     Hernia: No hernia is present.  Genitourinary:    Adnexa:        Left: No tenderness.    Skin:    General: Skin is warm and dry.  Neurological:     Mental Status: She is alert and oriented to person, place, and time.    Today's Vitals   01/14/19 0826  BP: 100/60  Pulse: 99  Resp: 16  Temp: 97.8 F (36.6 C)  TempSrc: Oral  SpO2: 99%  Weight: 130 lb 3.2 oz (59.1 kg)  Height: 5\' 2"  (1.575 m)   Body mass index is 23.81 kg/m.     Assessment & Plan:   Left lower quadrant abdominal  pain/painful menstrual periods - patient's left lower quadrant abdominal pain present for unclear reasons.  Differential diagnosis include possible ruptured ovarian cyst, menstrual cramps, inflammation of colon, UTI.  Urine is negative for UTI, does have some blood but patient is currently on her cycle.  Urine pregnancy is negative.  We discussed different options and patient is agreeable to get some blood work drawn in clinic today to rule out anemia, infection, electrolyte/liver/kidney abnormalities.  Also discussed possibility of getting a CT scan, patient declines CT scan at this time.  Pain does seem somewhat improved today, she will monitor herself for any worsening.   If pain worsens she will be agreeable to getting a CT scan.  Also discussed different birth control options that can help painful periods be less including various brands of birth control pills, the Mirena, birth control patches, NuvaRing, Depo-Provera shots -patient unsure of what which she would like to do in regards to beginning of birth control at this time, will do some research and let us know.  Recommended eating a bland diet for the next 1 to 2 days, keeping up good fluid intake with lots of water, Gatorade; then to slowly advance diet as tolerated.  Patient aware we will contact her in regards to lab results.  Also aware that if her pain worsens to call office and let us know and we will proceed forward with a CT scan.

## 2019-01-15 ENCOUNTER — Encounter: Payer: Self-pay | Admitting: Family Medicine

## 2019-01-15 LAB — URINE CULTURE
MICRO NUMBER:: 281404
SPECIMEN QUALITY:: ADEQUATE

## 2019-01-18 ENCOUNTER — Encounter: Payer: Self-pay | Admitting: Internal Medicine

## 2019-01-20 MED ORDER — CLONAZEPAM 0.5 MG PO TABS: 0.5000 mg | ORAL_TABLET | Freq: Every day | ORAL | 0 refills | Status: DC | PRN

## 2019-01-27 ENCOUNTER — Other Ambulatory Visit: Payer: Self-pay | Admitting: Internal Medicine

## 2019-02-05 ENCOUNTER — Other Ambulatory Visit: Payer: Self-pay | Admitting: Internal Medicine

## 2019-04-06 ENCOUNTER — Telehealth: Payer: Self-pay | Admitting: Obstetrics and Gynecology

## 2019-04-06 ENCOUNTER — Encounter: Payer: BC Managed Care – PPO | Admitting: Obstetrics and Gynecology

## 2019-04-14 ENCOUNTER — Encounter: Payer: Self-pay | Admitting: Obstetrics and Gynecology

## 2019-04-14 ENCOUNTER — Other Ambulatory Visit: Payer: Self-pay

## 2019-04-14 ENCOUNTER — Ambulatory Visit (INDEPENDENT_AMBULATORY_CARE_PROVIDER_SITE_OTHER): Payer: BC Managed Care – PPO | Admitting: Obstetrics and Gynecology

## 2019-04-14 VITALS — BP 124/84 | HR 77 | Ht 62.0 in | Wt 135.0 lb

## 2019-04-14 DIAGNOSIS — Z3169 Encounter for other general counseling and advice on procreation: Secondary | ICD-10-CM | POA: Diagnosis not present

## 2019-04-14 NOTE — Progress Notes (Signed)
Virtual Visit via Telephone Note  I connected with Sara Richardson on 04/14/19 at  8:45 AM EDT by telephone and verified that I am speaking with the correct person using two identifiers.  Location: Patient: home Provider: office   I discussed the limitations, risks, security and privacy concerns of performing an evaluation and management service by telephone and the availability of in person appointments. I also discussed with the patient that there may be a patient responsible charge related to this service. The patient expressed understanding and agreed to proceed.   History of Present Illness: Desires trying for pregnancy and wanted to discuss ways to prepare. Reports menses are typically 25 days apart, painful and last 6 days.  Nervous about getting pregnant due to the pandemic. Has multiple questions about conception, prenatal care and anxiety.   Observations/Objective: Well sounding female in no distress   Assessment and Plan: Pre-conception counseling  Follow Up Instructions: Answered questions about pregnancy, medications in pregnancy, routine midwifery care.   The patient was provided an opportunity to ask questions and all were answered. The patient agreed with the plan and demonstrated an understanding of the instructions.     I provided 20 minutes of non-face-to-face time during this encounter.   Melody Rockney Ghee, CNM

## 2019-04-14 NOTE — Patient Instructions (Signed)
Preparing for Pregnancy If you are considering becoming pregnant, make an appointment to see your regular health care provider to learn how to prepare for a safe and healthy pregnancy (preconception care). During a preconception care visit, your health care provider will:  Do a complete physical exam, including a Pap test.  Take a complete medical history.  Give you information, answer your questions, and help you resolve problems. Preconception checklist Medical history  Tell your health care provider about any current or past medical conditions. Your pregnancy or your ability to become pregnant may be affected by chronic conditions, such as diabetes, chronic hypertension, and thyroid problems.  Include your family's medical history as well as your partner's medical history.  Tell your health care provider about any history of STIs (sexually transmitted infections).These can affect your pregnancy. In some cases, they can be passed to your baby. Discuss any concerns that you have about STIs.  If indicated, discuss the benefits of genetic testing. This testing will show whether there are any genetic conditions that may be passed from you or your partner to your baby.  Tell your health care provider about: ? Any problems you have had with conception or pregnancy. ? Any medicines you take. These include vitamins, herbal supplements, and over-the-counter medicines. ? Your history of immunizations. Discuss any vaccinations that you may need. Diet  Ask your health care provider what to include in a healthy diet that has a balance of nutrients. This is especially important when you are pregnant or preparing to become pregnant.  Ask your health care provider to help you reach a healthy weight before pregnancy. ? If you are overweight, you may be at higher risk for certain complications, such as high blood pressure, diabetes, and preterm birth. ? If you are underweight, you are more likely to  have a baby who has a low birth weight. Lifestyle, work, and home  Let your health care provider know: ? About any lifestyle habits that you have, such as alcohol use, drug use, or smoking. ? About recreational activities that may put you at risk during pregnancy, such as downhill skiing and certain exercise programs. ? Tell your health care provider about any international travel, especially any travel to places with an active Zika virus outbreak. ? About harmful substances that you may be exposed to at work or at home. These include chemicals, pesticides, radiation, or even litter boxes. ? If you do not feel safe at home. Mental health  Tell your health care provider about: ? Any history of mental health conditions, including feelings of depression, sadness, or anxiety. ? Any medicines that you take for a mental health condition. These include herbs and supplements. Home instructions to prepare for pregnancy Lifestyle   Eat a balanced diet. This includes fresh fruits and vegetables, whole grains, lean meats, low-fat dairy products, healthy fats, and foods that are high in fiber. Ask to meet with a nutritionist or registered dietitian for assistance with meal planning and goals.  Get regular exercise. Try to be active for at least 30 minutes a day on most days of the week. Ask your health care provider which activities are safe during pregnancy.  Do not use any products that contain nicotine or tobacco, such as cigarettes and e-cigarettes. If you need help quitting, ask your health care provider.  Do not drink alcohol.  Do not take illegal drugs.  Maintain a healthy weight. Ask your health care provider what weight range is right for you. General   instructions  Keep an accurate record of your menstrual periods. This makes it easier for your health care provider to determine your baby's due date.  Begin taking prenatal vitamins and folic acid supplements daily as directed by your  health care provider.  Manage any chronic conditions, such as high blood pressure and diabetes, as told by your health care provider. This is important. How do I know that I am pregnant? You may be pregnant if you have been sexually active and you miss your period. Symptoms of early pregnancy include:  Mild cramping.  Very light vaginal bleeding (spotting).  Feeling unusually tired.  Nausea and vomiting (morning sickness). If you have any of these symptoms and you suspect that you might be pregnant, you can take a home pregnancy test. These tests check for a hormone in your urine (human chorionic gonadotropin, or hCG). A woman's body begins to make this hormone during early pregnancy. These tests are very accurate. Wait until at least the first day after you miss your period to take one. If the test shows that you are pregnant (you get a positive result), call your health care provider to make an appointment for prenatal care. What should I do if I become pregnant?      Make an appointment with your health care provider as soon as you suspect you are pregnant.  Do not use any products that contain nicotine, such as cigarettes, chewing tobacco, and e-cigarettes. If you need help quitting, ask your health care provider.  Do not drink alcoholic beverages. Alcohol is related to a number of birth defects.  Avoid toxic odors and chemicals.  You may continue to have sexual intercourse if it does not cause pain or other problems, such as vaginal bleeding. This information is not intended to replace advice given to you by your health care provider. Make sure you discuss any questions you have with your health care provider. Document Released: 10/10/2008 Document Revised: 10/30/2017 Document Reviewed: 05/19/2016 Elsevier Interactive Patient Education  2019 [employer].  Commonly Asked Questions During Pregnancy  Cats: A parasite can be excreted in cat feces.  To avoid exposure you need to  have another person empty the little box.  If you must empty the litter box you will need to wear gloves.  Wash your hands after handling your cat.  This parasite can also be found in raw or undercooked meat so this should also be avoided.  Colds, Sore Throats, Flu: Please check your medication sheet to see what you can take for symptoms.  If your symptoms are unrelieved by these medications please call the office.  Dental Work: Most any dental work Investment [employer] corporate recommends is permitted.  X-rays should only be taken during the first trimester if absolutely necessary.  Your abdomen should be shielded with a lead apron during all x-rays.  Please notify your provider prior to receiving any x-rays.  Novocaine is fine; gas is not recommended.  If your dentist requires a note from [place] prior to dental work please call the office and we will provide one for you.  Exercise: Exercise is an important part of staying healthy during your pregnancy.  You may continue most exercises you were accustomed to prior to pregnancy.  Later in your pregnancy you will most likely notice you have difficulty with activities requiring balance like riding a bicycle.  It is important that you listen to your body and avoid activities that put you at a higher risk of falling.  Adequate  rest and staying well hydrated are a must!  If you have questions about the safety of specific activities ask your provider.    Exposure to Children with illness: Try to avoid obvious exposure; report any symptoms to [place] when noted,  If you have chicken pos, red measles or mumps, you should be immune to these diseases.   Please do not take any vaccines while pregnant unless you have checked with your OB provider.  Fetal Movement: After 28 weeks we recommend you do "kick counts" twice daily.  Lie or sit down in a calm quiet environment and count your baby movements "kicks".  You should feel your baby at least 10 times per hour.  If you have not felt 10 kicks  within the first hour get up, walk around and have something sweet to eat or drink then repeat for an additional hour.  If count remains less than 10 per hour notify your provider.  Fumigating: Follow your pest control agent's advice as to how long to stay out of your home.  Ventilate the area well before re-entering.  Hemorrhoids:   Most over-the-counter preparations can be used during pregnancy.  Check your medication to see what is safe to use.  It is important to use a stool softener or fiber in your diet and to drink lots of liquids.  If hemorrhoids seem to be getting worse please call the office.   Hot Tubs:  Hot tubs Jacuzzis and saunas are not recommended while pregnant.  These increase your internal body temperature and should be avoided.  Intercourse:  Sexual intercourse is safe during pregnancy as long as you are comfortable, unless otherwise advised by your provider.  Spotting may occur after intercourse; report any bright red bleeding that is heavier than spotting.  Labor:  If you know that you are in labor, please go to the hospital.  If you are unsure, please call the office and let us help you decide what to do.  Lifting, straining, etc:  If your job requires heavy lifting or straining please check with your provider for any limitations.  Generally, you should not lift items heavier than that you can lift simply with your hands and arms (no back muscles)  Painting:  Paint fumes do not harm your pregnancy, but may make you ill and should be avoided if possible.  Latex or water based paints have less odor than oils.  Use adequate ventilation while painting.  Permanents & Hair Color:  Chemicals in hair dyes are not recommended as they cause increase hair dryness which can increase hair loss during pregnancy.  " Highlighting" and permanents are allowed.  Dye may be absorbed differently and permanents may not hold as well during pregnancy.  Sunbathing:  Use a sunscreen, as skin burns  easily during pregnancy.  Drink plenty of fluids; avoid over heating.  Tanning Beds:  Because their possible side effects are still unknown, tanning beds are not recommended.  Ultrasound Scans:  Routine ultrasounds are performed at approximately 20 weeks.  You will be able to see your baby's general anatomy an if you would like to know the gender this can usually be determined as well.  If it is questionable when you conceived you may also receive an ultrasound early in your pregnancy for dating purposes.  Otherwise ultrasound exams are not routinely performed unless there is a medical necessity.  Although you can request a scan we ask that you pay for it when conducted because insurance does not  cover " patient request" scans.  Work: If your pregnancy proceeds without complications you may work until your due date, unless your physician or employer advises otherwise.  Round Ligament Pain/Pelvic Discomfort:  Sharp, shooting pains not associated with bleeding are fairly common, usually occurring in the second trimester of pregnancy.  They tend to be worse when standing up or when you remain standing for long periods of time.  These are the result of pressure of certain pelvic ligaments called "round ligaments".  Rest, Tylenol and heat seem to be the most effective relief.  As the womb and fetus grow, they rise out of the pelvis and the discomfort improves.  Please notify the office if your pain seems different than that described.  It may represent a more serious condition.

## 2019-05-17 ENCOUNTER — Emergency Department
Admission: EM | Admit: 2019-05-17 | Discharge: 2019-05-17 | Disposition: A | Discharge status: Home / Self Care | Payer: BC Managed Care – PPO | Attending: Emergency Medicine | Admitting: Emergency Medicine

## 2019-05-17 ENCOUNTER — Encounter: Payer: Self-pay | Admitting: Emergency Medicine

## 2019-05-17 ENCOUNTER — Other Ambulatory Visit: Payer: Self-pay

## 2019-05-17 DIAGNOSIS — N390 Urinary tract infection, site not specified: Secondary | ICD-10-CM | POA: Diagnosis not present

## 2019-05-17 DIAGNOSIS — R1030 Lower abdominal pain, unspecified: Secondary | ICD-10-CM | POA: Diagnosis present

## 2019-05-17 LAB — CBC
HCT: 44.2 % (ref 36.0–46.0)
Hemoglobin: 14.6 g/dL (ref 12.0–15.0)
MCH: 28.4 pg (ref 26.0–34.0)
MCHC: 33 g/dL (ref 30.0–36.0)
MCV: 86 fL (ref 80.0–100.0)
Platelets: 321 10*3/uL (ref 150–400)
RBC: 5.14 MIL/uL — ABNORMAL HIGH (ref 3.87–5.11)
RDW: 12.2 % (ref 11.5–15.5)
WBC: 19.4 10*3/uL — ABNORMAL HIGH (ref 4.0–10.5)
nRBC: 0 % (ref 0.0–0.2)

## 2019-05-17 LAB — URINALYSIS, COMPLETE (UACMP) WITH MICROSCOPIC
Bilirubin Urine: NEGATIVE
Glucose, UA: NEGATIVE mg/dL
Ketones, ur: NEGATIVE mg/dL
Nitrite: NEGATIVE
Protein, ur: 30 mg/dL — AB
RBC / HPF: >50 RBC/hpf — ABNORMAL HIGH (ref 0–5)
Specific Gravity, Urine: 1.015 (ref 1.005–1.030)
WBC, UA: >50 WBC/hpf — ABNORMAL HIGH (ref 0–5)
pH: 7 (ref 5.0–8.0)

## 2019-05-17 LAB — COMPREHENSIVE METABOLIC PANEL
ALT: 16 U/L (ref 0–44)
AST: 27 U/L (ref 15–41)
Albumin: 5 g/dL (ref 3.5–5.0)
Alkaline Phosphatase: 42 U/L (ref 38–126)
Anion gap: 10 (ref 5–15)
BUN: 7 mg/dL (ref 6–20)
CO2: 23 mmol/L (ref 22–32)
Calcium: 9.4 mg/dL (ref 8.9–10.3)
Chloride: 107 mmol/L (ref 98–111)
Creatinine, Ser: 0.64 mg/dL (ref 0.44–1.00)
GFR calc Af Amer: >60 mL/min (ref >60)
GFR calc non Af Amer: >60 mL/min (ref >60)
Glucose, Bld: 121 mg/dL — ABNORMAL HIGH (ref 70–99)
Potassium: 3.4 mmol/L — ABNORMAL LOW (ref 3.5–5.1)
Sodium: 140 mmol/L (ref 135–145)
Total Bilirubin: 0.6 mg/dL (ref 0.3–1.2)
Total Protein: 8.2 g/dL — ABNORMAL HIGH (ref 6.5–8.1)

## 2019-05-17 LAB — CHLAMYDIA/NGC RT PCR (ARMC ONLY)
Chlamydia Tr: NOT DETECTED
N gonorrhoeae: NOT DETECTED

## 2019-05-17 LAB — WET PREP, GENITAL
Sperm: NONE SEEN
Trich, Wet Prep: NONE SEEN
Yeast Wet Prep HPF POC: NONE SEEN

## 2019-05-17 LAB — POCT PREGNANCY, URINE: Preg Test, Ur: NEGATIVE

## 2019-05-17 LAB — LIPASE, BLOOD: Lipase: 28 U/L (ref 11–51)

## 2019-05-17 MED ORDER — METRONIDAZOLE 500 MG PO TABS: 500.0000 mg | ORAL_TABLET | Freq: Two times a day (BID) | ORAL | 0 refills | Status: AC

## 2019-05-17 MED ORDER — PHENAZOPYRIDINE HCL 200 MG PO TABS: 200.0000 mg | ORAL_TABLET | Freq: Three times a day (TID) | ORAL | 0 refills | Status: DC | PRN

## 2019-05-17 MED ORDER — PHENAZOPYRIDINE HCL 200 MG PO TABS
200.0000 mg | ORAL_TABLET | Freq: Once | ORAL | Status: AC
  Administered 2019-05-17: 09:00:00 200 mg via ORAL
  Filled 2019-05-17: qty 1

## 2019-05-17 MED ORDER — CEPHALEXIN 500 MG PO CAPS
500.0000 mg | ORAL_CAPSULE | Freq: Once | ORAL | Status: AC
  Administered 2019-05-17: 500 mg via ORAL
  Filled 2019-05-17: qty 1

## 2019-05-17 MED ORDER — CEPHALEXIN 500 MG PO CAPS: 500.0000 mg | ORAL_CAPSULE | Freq: Three times a day (TID) | ORAL | 0 refills | Status: DC

## 2019-05-17 NOTE — ED Notes (Signed)
Pt alert and oriented X4, active, cooperative, pt in NAD. RR even and unlabored, color WNL.  Pt informed to return if any life threatening symptoms occur.  Discharge and followup instructions reviewed. AMBULATES safely.

## 2019-05-17 NOTE — ED Provider Notes (Signed)
[facility] Emergency Department Provider Note  Time seen: 8:16 AM  I have reviewed the triage vital signs and the nursing notes.   HISTORY  Chief Complaint Abdominal Pain    HPI Sara Richardson is a 28 y.o. female with a past medical history of anxiety who presents emergency department for lower abdominal pain, nausea and dysuria.  According to the patient yesterday she developed some lower abdominal pain that has progressively worsened, along with nausea.  Denies any diarrhea.  States she is noticed some darker urine with occasional dysuria.  Denies any significant vaginal discharge.  Denies any fever cough congestion or shortness of breath.  Patient states she has had 2 prior urinary tract infections over the past 3 to 4 months, but has not had any urinary tract infections prior to that in her life.  Past Medical History:  Diagnosis Date  . Anxiety   . SVT (supraventricular tachycardia) [facility])     Patient Active Problem List   Diagnosis Date Noted  . Back pain 12/11/2017  . SVT (supraventricular tachycardia) ([place]) 12/01/2016  . Anxiety 12/01/2016  . Menstrual cramps 12/01/2016  . Hx of prior ablation treatment 06/26/2013    Past Surgical History:  Procedure Laterality Date  . ABLATION    . NO PAST SURGERIES      Prior to Admission medications   Medication Sig Start Date End Date Taking? Authorizing Provider  busPIRone (BUSPAR) 5 MG tablet TAKE 1 TABLET (5 MG TOTAL) BY MOUTH DAILY AS NEEDED. Patient not taking: Reported on 04/14/2019 02/05/19   Einar Pheasant, MD  clonazePAM (KLONOPIN) 0.5 MG tablet Take 1 tablet (0.5 mg total) by mouth daily as needed for anxiety. 01/20/19   Crecencio Mc, MD    No Known Allergies  Family History  Problem Relation Age of Onset  . Breast cancer Maternal Grandmother        lung cancer   . Prostate cancer Maternal Grandfather     Social History Social History   Tobacco Use  . Smoking status: Never Smoker   . Smokeless tobacco: Never Used  Substance Use Topics  . Alcohol use: Yes    Comment: occ  . Drug use: No    Review of Systems Constitutional: Negative for fever. ENT: Negative for recent illness/congestion Cardiovascular: Negative for chest pain. Respiratory: Negative for shortness of breath. Gastrointestinal: Positive for lower abdominal pain.  Positive for nausea.  Negative for vomiting or diarrhea. Genitourinary: Darker urine, slight dysuria.  No significant vaginal discharge. Musculoskeletal: Negative for musculoskeletal complaints Skin: Negative for skin complaints  Neurological: Negative for headache All other ROS negative  ____________________________________________   PHYSICAL EXAM:  VITAL SIGNS: ED Triage Vitals [05/17/19 0701]  Enc Vitals Group     BP (!) 135/91     Pulse Rate (!) 118     Resp 18     Temp 98.3 F (36.8 C)     Temp Source Oral     SpO2 100 %     Weight 130 lb (59 kg)     Height 5\' 2"  (1.575 m)     Head Circumference      Peak Flow      Pain Score 6     Pain Loc      Pain Edu?      Excl. in [place]?     Constitutional: Alert and oriented. Well appearing and in no distress. Eyes: Normal exam ENT      Head: Normocephalic and atraumatic.  Mouth/Throat: Mucous membranes are moist. Cardiovascular: Normal rate, regular rhythm.  Respiratory: Normal respiratory effort without tachypnea nor retractions. Breath sounds are clear  Gastrointestinal: Soft, mild to moderate suprapubic tenderness without rebound guarding or distention.  Abdomen otherwise benign.  No CVA tenderness. Musculoskeletal: Nontender with normal range of motion in all extremities.  Neurologic:  Normal speech and language. No gross focal neurologic deficits Skin:  Skin is warm, dry and intact.  Psychiatric: Mood and affect are normal.   ____________________________________________   INITIAL IMPRESSION / ASSESSMENT AND PLAN / ED COURSE  Pertinent labs & imaging results  that were available during my care of the patient were reviewed by me and considered in my medical decision making (see chart for details).   Patient presents to the emergency department for lower abdominal discomfort since yesterday.  Differential would include urinary tract infection, pyelonephritis, pelvic infection.  Patient's lab work is consistent with a urinary tract infection with moderate leukocytosis.  As this is the patient's third urinary tract infection in the past 4 months and no UTI previously I did discuss the possibility of an undiagnosed pelvic infection.  Patient would like to proceed with pelvic exam and swabs.  Patient strongly wishes to avoid an IV, states she has a very strong vagal response.  We will dose oral Keflex.  I have sent a urine culture.  We will send swabs.  Overall the patient appears well, reassuring vitals besides mild tachycardia.  Anticipate likely discharge home with antibiotics.  Patient's pelvic exam is largely nontender no significant cervicitis mild/normal amount of cervical discharge.  Wet prep positive for BV we will discharge her Flagyl as well.  Abia L Lovering was evaluated in Emergency Department on 05/17/2019 for the symptoms described in the history of present illness. She was evaluated in the context of the global COVID-19 pandemic, which necessitated consideration that the patient might be at risk for infection with the SARS-CoV-2 virus that causes COVID-19. Institutional protocols and algorithms that pertain to the evaluation of patients at risk for COVID-19 are in a state of rapid change based on information released by regulatory bodies including the CDC and federal and state organizations. These policies and algorithms were followed during the patient's care in the ED.  ____________________________________________   FINAL CLINICAL IMPRESSION(S) / ED DIAGNOSES  Urinary tract infection Bacterial vaginosis   Harvest Dark, MD 05/19/19  [phone]

## 2019-05-17 NOTE — Discharge Instructions (Addendum)
As we discussed please take your antibiotics as prescribed and Pyridium as needed, as written.  Return to the emergency department for any increased abdominal pain, development of fever, vomiting unable to keep down your antibiotics, or any other symptom personally concerning to yourself.

## 2019-05-17 NOTE — ED Triage Notes (Signed)
Patient with complaint of urinary frequency, lower abdominal pain, nausea and blood in her urine that started yesterday.

## 2019-05-24 ENCOUNTER — Encounter: Payer: Self-pay | Admitting: Internal Medicine

## 2019-05-27 NOTE — Telephone Encounter (Signed)
Please call and notify pt that I reviewed her recent labs.  I do agree that she needs a f/u urine and actually needs f/u labs. Would she be agreeable to come in and leave a urine sample and let us drawn more blood to f/u on the sugar and some other tests. If so, please schedule her for a lab appt and I will place the order for the labs.

## 2019-05-28 ENCOUNTER — Encounter: Payer: Self-pay | Admitting: Family Medicine

## 2019-05-28 ENCOUNTER — Telehealth: Payer: Self-pay | Admitting: Lab

## 2019-05-28 NOTE — Telephone Encounter (Signed)
Called Pt No answer left VM Was trying to schedule Pt for Office visit  appt on 05/31/19 with NP Philis Nettle

## 2019-05-28 NOTE — Telephone Encounter (Signed)
lmtcb

## 2019-05-28 NOTE — Telephone Encounter (Signed)
Called Pt No answer left VM will try back later

## 2019-05-31 NOTE — Telephone Encounter (Signed)
Ok thanks  Will send also to [place] for [employer]

## 2019-05-31 NOTE — Telephone Encounter (Signed)
See if pt would be willing to go to [facility].  If so, I can put in order for labs.

## 2019-05-31 NOTE — Telephone Encounter (Signed)
There is a my chart message on this patient about doing a f/u urine and labs. Per policy, pt cannot come into office right now due to exposure.

## 2019-05-31 NOTE — Telephone Encounter (Signed)
Called Pt No answer left VM for Pt to call office. I will try to call  back later.

## 2019-05-31 NOTE — Telephone Encounter (Signed)
Pt called returning your phone call  Tried to schedule pt an appt but she was just around someone that test negative for Covid but had all the symptoms  Pt didn't want to do a virtual visit

## 2019-05-31 NOTE — Telephone Encounter (Signed)
Looks like Dr Nicki Reaper suggests she have follow up urine/labs  Can make appt with Me or Dr Nicki Reaper and we can get these all set up  May also need gyn referral if these symptoms are recurrent  Thanks  LG

## 2019-06-01 ENCOUNTER — Encounter: Payer: Self-pay | Admitting: Family Medicine

## 2019-06-01 ENCOUNTER — Telehealth: Payer: Self-pay

## 2019-06-01 NOTE — Telephone Encounter (Signed)
Spoke with patient- she states she is not really having any UTI symptoms she just wants to check to be sure the Keflex she was given on 05/17/19 along with the Pyridium given through ED cleared up her symptoms. Her last dose of Keflex was 05/26/19. Patient will come by the office in the morning and leave a urine specimen for culture. She denies a fever, urgency, frequency, or burning.  She states she has had 3 UTIs back to back. The culture on 12/14/18 was normal. She also had a normal culture 01/14/19.

## 2019-06-01 NOTE — Telephone Encounter (Signed)
See phone note for documentation.

## 2019-06-01 NOTE — Telephone Encounter (Signed)
Requesting an appt

## 2019-06-01 NOTE — Telephone Encounter (Signed)
Patient has been scheduled with her OB. Did not want to go to [facility] mall. Requested pelvic exam. Advised per policy she cannot come in office until it has been 30 days since person that she was in direct contact with has no symptoms. Pt is going to keep appt with OB

## 2019-06-01 NOTE — Telephone Encounter (Signed)
I talked to pt yesterday  She was around someone over the weekend with Covid symptoms but tested negative  I told her I could schedule her for a virtual appt but she declined  Only wanted to be seen in the office

## 2019-06-02 ENCOUNTER — Other Ambulatory Visit: Payer: Self-pay

## 2019-06-02 ENCOUNTER — Encounter: Payer: BC Managed Care – PPO | Admitting: Certified Nurse Midwife

## 2019-06-02 ENCOUNTER — Other Ambulatory Visit: Payer: BC Managed Care – PPO

## 2019-06-02 ENCOUNTER — Other Ambulatory Visit (INDEPENDENT_AMBULATORY_CARE_PROVIDER_SITE_OTHER): Payer: BC Managed Care – PPO

## 2019-06-02 DIAGNOSIS — N390 Urinary tract infection, site not specified: Secondary | ICD-10-CM | POA: Diagnosis not present

## 2019-06-02 LAB — POCT URINALYSIS DIPSTICK
Bilirubin, UA: NEGATIVE
Blood, UA: NEGATIVE
Glucose, UA: NEGATIVE
Ketones, UA: NEGATIVE
Leukocytes, UA: NEGATIVE
Nitrite, UA: NEGATIVE
Protein, UA: NEGATIVE
Spec Grav, UA: 1.01 (ref 1.010–1.025)
Urobilinogen, UA: 0.2 E.U./dL
pH, UA: 7.5 (ref 5.0–8.0)

## 2019-06-02 NOTE — Progress Notes (Signed)
Specimen sent to [facility].

## 2019-06-03 ENCOUNTER — Ambulatory Visit: Payer: BC Managed Care – PPO | Admitting: Family Medicine

## 2019-06-04 LAB — URINE CULTURE

## 2019-07-02 ENCOUNTER — Ambulatory Visit: Payer: BC Managed Care – PPO | Admitting: Family Medicine

## 2019-07-02 ENCOUNTER — Encounter: Payer: Self-pay | Admitting: Family Medicine

## 2019-07-02 ENCOUNTER — Other Ambulatory Visit: Payer: Self-pay

## 2019-07-02 VITALS — BP 118/60 | HR 93 | Temp 97.2°F | Resp 16 | Ht 62.0 in | Wt 133.4 lb

## 2019-07-02 DIAGNOSIS — R300 Dysuria: Secondary | ICD-10-CM | POA: Diagnosis not present

## 2019-07-02 DIAGNOSIS — R350 Frequency of micturition: Secondary | ICD-10-CM | POA: Diagnosis not present

## 2019-07-02 LAB — POCT URINALYSIS DIPSTICK
Bilirubin, UA: NEGATIVE
Blood, UA: NEGATIVE
Glucose, UA: NEGATIVE
Ketones, UA: NEGATIVE
Leukocytes, UA: NEGATIVE
Nitrite, UA: NEGATIVE
Protein, UA: NEGATIVE
Spec Grav, UA: 1.015 (ref 1.010–1.025)
Urobilinogen, UA: 0.2 E.U./dL
pH, UA: 6 (ref 5.0–8.0)

## 2019-07-02 MED ORDER — CIPROFLOXACIN HCL 250 MG PO TABS: 250.0000 mg | ORAL_TABLET | Freq: Two times a day (BID) | ORAL | 0 refills | Status: DC

## 2019-07-02 NOTE — Progress Notes (Signed)
Subjective:    Patient ID: Sara Richardson, female    DOB: Apr 02, 1991, 28 y.o.   MRN: [id number]  HPI   Patient presents to clinic due to feelings of dysuria and urinary frequency for past 2 days.  Patient has had issues with urinary frequency, feelings of dysuria and lower abdominal pain off and on for many months.  Patient has been treated for a UTI at least 3 times this calendar year.  Denies any fever or chills.  Denies nausea, vomiting or diarrhea.  In the past I have mentioned to patient idea of seeing a urologist or uro-gynecologist due to persistent urinary/pelvic/dysuria symptoms but at that time she had declined.  Patient is concerned that she has had symptoms for 2 days and taking cranberry supplement has not helped.  It is Friday and she is concerned she may get worse over the weekend.  Denies visible blood or mucus in urine.  Urine does not appear cloudy.  Denies foul smell to urine.  Patient Active Problem List   Diagnosis Date Noted  . Back pain 12/11/2017  . SVT (supraventricular tachycardia) ([facility]) 12/01/2016  . Anxiety 12/01/2016  . Menstrual cramps 12/01/2016  . Hx of prior ablation treatment 06/26/2013   Social History   Tobacco Use  . Smoking status: Never Smoker  . Smokeless tobacco: Never Used  Substance Use Topics  . Alcohol use: Yes    Comment: occ     Review of Systems  Constitutional: Negative for chills, fatigue and fever.  HENT: Negative for congestion, ear pain, sinus pain and sore throat.   Eyes: Negative.   Respiratory: Negative for cough, shortness of breath and wheezing.   Cardiovascular: Negative for chest pain, palpitations and leg swelling.  Gastrointestinal: Negative for abdominal pain, diarrhea, nausea and vomiting.  Genitourinary: +dysuria and frequency Musculoskeletal: Negative for arthralgias and myalgias.  Skin: Negative for color change, pallor and rash.  Neurological: Negative for syncope, light-headedness and headaches.   Psychiatric/Behavioral: The patient is not nervous/anxious.       Objective:   Physical Exam Vitals signs and nursing note reviewed.  Constitutional:      General: She is not in acute distress.    Appearance: She is not ill-appearing, toxic-appearing or diaphoretic.  HENT:     Head: Normocephalic and atraumatic.  Eyes:     General: No scleral icterus.    Extraocular Movements: Extraocular movements intact.     Pupils: Pupils are equal, round, and reactive to light.  Cardiovascular:     Rate and Rhythm: Normal rate and regular rhythm.     Heart sounds: Normal heart sounds.  Pulmonary:     Effort: Pulmonary effort is normal. No respiratory distress.     Breath sounds: Normal breath sounds.  Abdominal:     General: Abdomen is flat. Bowel sounds are normal. There is no distension.     Palpations: Abdomen is soft. There is no mass.     Tenderness: There is abdominal tenderness (mild suprapubic tenderness. ). There is no right CVA tenderness, left CVA tenderness, guarding or rebound.     Hernia: No hernia is present.  Skin:    General: Skin is warm and dry.     Coloration: Skin is not jaundiced or pale.  Neurological:     General: No focal deficit present.     Mental Status: She is alert and oriented to person, place, and time.  Psychiatric:        Mood and Affect: Mood normal.  Behavior: Behavior normal.     Today's Vitals   07/02/19 1321  BP: 118/60  Pulse: 93  Resp: 16  Temp: (!) 97.2 F (36.2 C)  TempSrc: Temporal  SpO2: 98%  Weight: 133 lb 6.4 oz (60.5 kg)  Height: 5\' 2"  (1.575 m)   Body mass index is 24.4 kg/m.    Assessment & Plan:    Dysuria chronic, urinary frequency - urinalysis is unremarkable for any abnormality in clinic.  We will also send culture to lab.  Discussed with patient that her symptoms could be related to different things including pelvic pain that is chronic, interstitial cystitis and or UTI.  Due to it being Friday, we will cover her  with Cipro 250 mg twice daily for 3 days and await culture results.  Patient is now agreeable to urogynecology referral for further evaluation and management of her chronic dysuria/pelvic symptoms so we can be sure nothing else more serious is at play.  Patient aware that she can return to clinic at any time if issues arise and to call office if she does not hear anything in regards to her referral in the next 14 days.

## 2019-07-03 LAB — URINE CULTURE
MICRO NUMBER:: 798054
SPECIMEN QUALITY:: ADEQUATE

## 2019-07-15 ENCOUNTER — Ambulatory Visit (INDEPENDENT_AMBULATORY_CARE_PROVIDER_SITE_OTHER): Payer: BC Managed Care – PPO | Admitting: Obstetrics and Gynecology

## 2019-07-15 ENCOUNTER — Other Ambulatory Visit (HOSPITAL_COMMUNITY)
Admission: RE | Admit: 2019-07-15 | Discharge: 2019-07-15 | Disposition: A | Discharge status: Home / Self Care | Payer: BC Managed Care – PPO | Source: Ambulatory Visit | Attending: Obstetrics and Gynecology | Admitting: Obstetrics and Gynecology

## 2019-07-15 ENCOUNTER — Encounter: Payer: Self-pay | Admitting: Obstetrics and Gynecology

## 2019-07-15 ENCOUNTER — Other Ambulatory Visit: Payer: Self-pay

## 2019-07-15 VITALS — BP 125/86 | HR 115 | Ht 62.0 in | Wt 134.5 lb

## 2019-07-15 DIAGNOSIS — Z01419 Encounter for gynecological examination (general) (routine) without abnormal findings: Secondary | ICD-10-CM | POA: Diagnosis present

## 2019-07-15 NOTE — Progress Notes (Signed)
Subjective:     Sara Richardson is a married white 28 y.o. female and is here for a comprehensive physical exam. Sexually active with female spouse. FT teacher.  Menses are still normal. Using condoms and waiting on pregnancy for now. The patient reports no problems.  Social History   Socioeconomic History  . Marital status: Married    Spouse name: Not on file  . Number of children: Not on file  . Years of education: Not on file  . Highest education level: Not on file  Occupational History  . Occupation: Pharmacist, hospital    CommentSports administrator - second grade  Social Needs  . Financial resource strain: Not on file  . Food insecurity    Worry: Not on file    Inability: Not on file  . Transportation needs    Medical: Not on file    Non-medical: Not on file  Tobacco Use  . Smoking status: Never Smoker  . Smokeless tobacco: Never Used  Substance and Sexual Activity  . Alcohol use: Yes    Comment: occ  . Drug use: No  . Sexual activity: Yes    Partners: Male    Birth control/protection: Condom  Lifestyle  . Physical activity    Days per week: Not on file    Minutes per session: Not on file  . Stress: Not on file  Relationships  . Social Herbalist on phone: Not on file    Gets together: Not on file    Attends religious service: Not on file    Active member of club or organization: Not on file    Attends meetings of clubs or organizations: Not on file    Relationship status: Not on file  . Intimate partner violence    Fear of current or ex partner: Not on file    Emotionally abused: Not on file    Physically abused: Not on file    Forced sexual activity: Not on file  Other Topics Concern  . Not on file  Social History Narrative  . Not on file   Health Maintenance  Topic Date Due  . HIV Screening  07/11/2007  . TETANUS/TDAP  07/11/2011  . INFLUENZA VACCINE  06/12/2019  . PAP-Cervical Cytology Screening  06/19/2020  . PAP SMEAR-Modifier  06/19/2020    The  following portions of the patient's history were reviewed and updated as appropriate: allergies, current medications, past family history, past medical history, past social history, past surgical history and problem list.  Review of Systems Pertinent items noted in HPI and remainder of comprehensive ROS otherwise negative.   Objective:    BP 125/86   Pulse (!) 115   Ht 5\' 2"  (1.575 m)   Wt 134 lb 8 oz (61 kg)   LMP 06/30/2019   BMI 24.60 kg/m  General appearance: alert, cooperative and appears stated age Back: symmetric, no curvature. ROM normal. No CVA tenderness. Breasts: normal appearance, no masses or tenderness Heart: regular rate and rhythm, S1, S2 normal, no murmur, click, rub or gallop Abdomen: soft, non-tender; bowel sounds normal; no masses,  no organomegaly Pelvic: cervix normal in appearance, external genitalia normal, no adnexal masses or tenderness, no cervical motion tenderness, rectovaginal septum normal, uterus normal size, shape, and consistency and vagina normal without discharge    Assessment:    Healthy female exam.      Plan:  Declines flu vaccine. RTC 1 year or as needed.  Jasneet Schobert,CNM   See After  Visit Summary for Counseling Recommendations

## 2019-07-15 NOTE — Patient Instructions (Signed)
 Preventive Care 21-27 Years Old, Female Preventive care refers to visits with your health care provider and lifestyle choices that can promote health and wellness. This includes:  A yearly physical exam. This may also be called an annual well check.  Regular dental visits and eye exams.  Immunizations.  Screening for certain conditions.  Healthy lifestyle choices, such as eating a healthy diet, getting regular exercise, not using drugs or products that contain nicotine and tobacco, and limiting alcohol use. What can I expect for my preventive care visit? Physical exam Your health care provider will check your:  Height and weight. This may be used to calculate body mass index (BMI), which tells if you are at a healthy weight.  Heart rate and blood pressure.  Skin for abnormal spots. Counseling Your health care provider may ask you questions about your:  Alcohol, tobacco, and drug use.  Emotional well-being.  Home and relationship well-being.  Sexual activity.  Eating habits.  Work and work environment.  Method of birth control.  Menstrual cycle.  Pregnancy history. What immunizations do I need?  Influenza (flu) vaccine  This is recommended every year. Tetanus, diphtheria, and pertussis (Tdap) vaccine  You may need a Td booster every 10 years. Varicella (chickenpox) vaccine  You may need this if you have not been vaccinated. Human papillomavirus (HPV) vaccine  If recommended by your health care provider, you may need three doses over 6 months. Measles, mumps, and rubella (MMR) vaccine  You may need at least one dose of MMR. You may also need a second dose. Meningococcal conjugate (MenACWY) vaccine  One dose is recommended if you are age 19-21 years and a first-year college student living in a residence hall, or if you have one of several medical conditions. You may also need additional booster doses. Pneumococcal conjugate (PCV13) vaccine  You may need  this if you have certain conditions and were not previously vaccinated. Pneumococcal polysaccharide (PPSV23) vaccine  You may need one or two doses if you smoke cigarettes or if you have certain conditions. Hepatitis A vaccine  You may need this if you have certain conditions or if you travel or work in places where you may be exposed to hepatitis A. Hepatitis B vaccine  You may need this if you have certain conditions or if you travel or work in places where you may be exposed to hepatitis B. Haemophilus influenzae type b (Hib) vaccine  You may need this if you have certain conditions. You may receive vaccines as individual doses or as more than one vaccine together in one shot (combination vaccines). Talk with your health care provider about the risks and benefits of combination vaccines. What tests do I need?  Blood tests  Lipid and cholesterol levels. These may be checked every 5 years starting at age 20.  Hepatitis C test.  Hepatitis B test. Screening  Diabetes screening. This is done by checking your blood sugar (glucose) after you have not eaten for a while (fasting).  Sexually transmitted disease (STD) testing.  BRCA-related cancer screening. This may be done if you have a family history of breast, ovarian, tubal, or peritoneal cancers.  Pelvic exam and Pap test. This may be done every 3 years starting at age 21. Starting at age 30, this may be done every 5 years if you have a Pap test in combination with an HPV test. Talk with your health care provider about your test results, treatment options, and if necessary, the need for more   tests. Follow these instructions at home: Eating and drinking   Eat a diet that includes fresh fruits and vegetables, whole grains, lean protein, and low-fat dairy.  Take vitamin and mineral supplements as recommended by your health care provider.  Do not drink alcohol if: ? Your health care provider tells you not to drink. ? You are  pregnant, may be pregnant, or are planning to become pregnant.  If you drink alcohol: ? Limit how much you have to 0-1 drink a day. ? Be aware of how much alcohol is in your drink. In the [place] one drink equals one 12 oz bottle of beer (355 mL), one 5 oz glass of wine (148 mL), or one 1 oz glass of hard liquor (44 mL). Lifestyle  Take daily care of your teeth and gums.  Stay active. Exercise for at least 30 minutes on 5 or more days each week.  Do not use any products that contain nicotine or tobacco, such as cigarettes, e-cigarettes, and chewing tobacco. If you need help quitting, ask your health care provider.  If you are sexually active, practice safe sex. Use a condom or other form of birth control (contraception) in order to prevent pregnancy and STIs (sexually transmitted infections). If you plan to become pregnant, see your health care provider for a preconception visit. What's next?  Visit your health care provider once a year for a well check visit.  Ask your health care provider how often you should have your eyes and teeth checked.  Stay up to date on all vaccines. This information is not intended to replace advice given to you by your health care provider. Make sure you discuss any questions you have with your health care provider. Document Released: 12/24/2001 Document Revised: 07/09/2018 Document Reviewed: 07/09/2018 Elsevier Patient Education  2020 [employer].

## 2019-07-20 LAB — CYTOLOGY - PAP
Chlamydia: NEGATIVE
Diagnosis: NEGATIVE
Neisseria Gonorrhea: NEGATIVE

## 2019-09-15 ENCOUNTER — Telehealth: Payer: Self-pay

## 2019-09-15 NOTE — Telephone Encounter (Signed)
ONLINE MEDICAL EVALUATION PERFORMED BY PHYSICIAN"  Is the correct charge for the virtual visits we were charging per pt phone call to [facility].  Pt states ins told her may providers made that error of using the wrong charge. Ins rep gave her the correct one stated above. Please correct and resubmit or direct this to the correct billing associate.

## 2019-09-17 NOTE — Telephone Encounter (Signed)
Pt aware per billing [employer] has made a payment and she does not have any responsibility at this time.

## 2019-12-16 ENCOUNTER — Other Ambulatory Visit: Payer: Self-pay

## 2019-12-16 ENCOUNTER — Ambulatory Visit (INDEPENDENT_AMBULATORY_CARE_PROVIDER_SITE_OTHER): Payer: BC Managed Care – PPO | Admitting: Internal Medicine

## 2019-12-16 VITALS — BP 128/70 | HR 95 | Temp 98.0°F | Resp 16 | Ht 62.0 in | Wt 140.0 lb

## 2019-12-16 DIAGNOSIS — I471 Supraventricular tachycardia: Secondary | ICD-10-CM | POA: Diagnosis not present

## 2019-12-16 DIAGNOSIS — N926 Irregular menstruation, unspecified: Secondary | ICD-10-CM

## 2019-12-16 DIAGNOSIS — F419 Anxiety disorder, unspecified: Secondary | ICD-10-CM | POA: Diagnosis not present

## 2019-12-16 DIAGNOSIS — Z0000 Encounter for general adult medical examination without abnormal findings: Secondary | ICD-10-CM

## 2019-12-16 MED ORDER — BUPROPION HCL 75 MG PO TABS: 75.0000 mg | ORAL_TABLET | Freq: Two times a day (BID) | ORAL | 1 refills | Status: DC

## 2019-12-16 NOTE — Progress Notes (Signed)
Patient ID: Sara Richardson, female   DOB: 1991/07/12, 29 y.o.   MRN: [id number]   Subjective:    Patient ID: Sara Richardson, female    DOB: Sep 29, 1991, 29 y.o.   MRN: [id number]  HPI This visit occurred during the SARS-CoV-2 public health emergency.  Safety protocols were in place, including screening questions prior to the visit, additional usage of staff PPE, and extensive cleaning of exam room while observing appropriate contact time as indicated for disinfecting solutions.  Patient here for a scheduled physical.  Sees gyn for gyn exams.  States she is doing relatively well.  Tries to stay active.  No chest pain.  No increased heart rate or palpitations.  No abdominal pain reported.  Increased stress.  Teaching - virtual. Discussed with her today.  Has clonazepam to take prn.  Discussed taking daily medication to help control symptoms.     Past Medical History:  Diagnosis Date  . Anxiety   . SVT (supraventricular tachycardia) ([facility])    Past Surgical History:  Procedure Laterality Date  . ABLATION    . NO PAST SURGERIES     Family History  Problem Relation Age of Onset  . Breast cancer Maternal Grandmother        lung cancer   . Prostate cancer Maternal Grandfather    Social History   Socioeconomic History  . Marital status: Married    Spouse name: Not on file  . Number of children: Not on file  . Years of education: Not on file  . Highest education level: Not on file  Occupational History  . 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 Smoking status: Never Smoker  . Smokeless tobacco: Never Used  Substance and Sexual Activity  . Alcohol use: Yes    Comment: occ  . Drug use: No  . Sexual activity: Yes    Partners: Male    Birth control/protection: Condom  Other Topics Concern  . Not on file  Social History Narrative  . Not on file   Social Determinants of Health   Financial Resource Strain:   . Difficulty of Paying Living Expenses:  Not on file  Food Insecurity:   . Worried About Charity fundraiser in the Last Year: Not on file  . Ran Out of Food in the Last Year: Not on file  Transportation Needs:   . Lack of Transportation (Medical): Not on file  . Lack of Transportation (Non-Medical): Not on file  Physical Activity:   . Days of Exercise per Week: Not on file  . Minutes of Exercise per Session: Not on file  Stress:   . Feeling of Stress : Not on file  Social Connections:   . Frequency of Communication with Friends and Family: Not on file  . Frequency of Social Gatherings with Friends and Family: Not on file  . Attends Religious Services: Not on file  . Active Member of Clubs or Organizations: Not on file  . Attends Archivist Meetings: Not on file  . Marital Status: Not on file    Outpatient Encounter Medications as of 12/16/2019  Medication Sig  . Black Elderberry,Berry-Flower, 575 MG CAPS Take 1 capsule by mouth as directed.  Marland Kitchen buPROPion (WELLBUTRIN) 75 MG tablet Take 1 tablet (75 mg total) by mouth 2 (two) times daily.  . clonazePAM (KLONOPIN) 0.5 MG tablet Take 1 tablet (0.5 mg total) by mouth daily as needed for anxiety.  Marland Kitchen  prenatal vitamin w/FE, FA (NATACHEW) 29-1 MG CHEW chewable tablet Chew 1 tablet by mouth daily at 12 noon.  . [DISCONTINUED] busPIRone (BUSPAR) 5 MG tablet TAKE 1 TABLET (5 MG TOTAL) BY MOUTH DAILY AS NEEDED. (Patient not taking: Reported on 07/15/2019)  . [DISCONTINUED] phenazopyridine (PYRIDIUM) 200 MG tablet Take 1 tablet (200 mg total) by mouth 3 (three) times daily as needed for pain. (Patient not taking: Reported on 07/15/2019)   No facility-administered encounter medications on file as of 12/16/2019.    Review of Systems  Constitutional: Negative for appetite change and unexpected weight change.  HENT: Negative for congestion and sinus pressure.   Eyes: Negative for pain and visual disturbance.  Respiratory: Negative for cough, chest tightness and shortness of breath.    Cardiovascular: Negative for chest pain, palpitations and leg swelling.  Gastrointestinal: Negative for abdominal pain, diarrhea, nausea and vomiting.  Genitourinary: Negative for difficulty urinating and dysuria.  Musculoskeletal: Negative for joint swelling and myalgias.  Skin: Negative for color change and rash.  Neurological: Negative for dizziness, light-headedness and headaches.  Hematological: Negative for adenopathy. Does not bruise/bleed easily.  Psychiatric/Behavioral: Negative for agitation and dysphoric mood.       Objective:    Physical Exam Constitutional:      General: She is not in acute distress.    Appearance: Normal appearance.  HENT:     Head: Normocephalic and atraumatic.     Right Ear: External ear normal.     Left Ear: External ear normal.  Eyes:     General: No scleral icterus.       Right eye: No discharge.        Left eye: No discharge.     Conjunctiva/sclera: Conjunctivae normal.  Neck:     Thyroid: No thyromegaly.  Cardiovascular:     Rate and Rhythm: Normal rate and regular rhythm.  Pulmonary:     Effort: No respiratory distress.     Breath sounds: Normal breath sounds. No wheezing.  Abdominal:     General: Bowel sounds are normal.     Palpations: Abdomen is soft.     Tenderness: There is no abdominal tenderness.  Musculoskeletal:        General: No swelling or tenderness.     Cervical back: Neck supple. No tenderness.  Lymphadenopathy:     Cervical: No cervical adenopathy.  Skin:    Findings: No erythema or rash.  Neurological:     Mental Status: She is alert.  Psychiatric:        Mood and Affect: Mood normal.        Behavior: Behavior normal.     BP 128/70   Pulse 95   Temp 98 F (36.7 C)   Resp 16   Ht 5\' 2"  (1.575 m)   Wt 140 lb (63.5 kg)   SpO2 99%   BMI 25.61 kg/m  Wt Readings from Last 3 Encounters:  12/16/19 140 lb (63.5 kg)  07/15/19 134 lb 8 oz (61 kg)  07/02/19 133 lb 6.4 oz (60.5 kg)     Lab Results   Component Value Date   WBC 19.4 (H) 05/17/2019   HGB 14.6 05/17/2019   HCT 44.2 05/17/2019   PLT 321 05/17/2019   GLUCOSE 121 (H) 05/17/2019   ALT 16 05/17/2019   AST 27 05/17/2019   NA 140 05/17/2019   K 3.4 (L) 05/17/2019   CL 107 05/17/2019   CREATININE 0.64 05/17/2019   BUN 7 05/17/2019   CO2 23  05/17/2019       Assessment & Plan:   Problem List Items Addressed This Visit    Anxiety    Discussed with her today. Discussed treatment options.  Start wellbutrin.  Follow closely.        Relevant Medications   buPROPion (WELLBUTRIN) 75 MG tablet   Healthcare maintenance    Physical today 12/16/19.  Sees gyn for gyn exam.        SVT (supraventricular tachycardia) ([place])    Stable.  Follow.         Other Visit Diagnoses    Routine general medical examination at a health care facility    -  Primary   Menstrual irregularity       Relevant Orders   POCT urine pregnancy       Einar Pheasant, MD

## 2019-12-19 ENCOUNTER — Encounter: Payer: Self-pay | Admitting: Internal Medicine

## 2019-12-19 NOTE — Assessment & Plan Note (Signed)
Discussed with her today. Discussed treatment options.  Start wellbutrin.  Follow closely.

## 2019-12-19 NOTE — Assessment & Plan Note (Signed)
Stable.  Follow.   

## 2019-12-19 NOTE — Assessment & Plan Note (Signed)
Physical today 12/16/19.  Sees gyn for gyn exam.

## 2019-12-23 ENCOUNTER — Telehealth: Payer: Self-pay | Admitting: Internal Medicine

## 2019-12-23 NOTE — Telephone Encounter (Signed)
Does she want to schedule a doxy to discuss further evaluation and treatment

## 2019-12-23 NOTE — Telephone Encounter (Signed)
Have her hold on the wellbutrin for now.  Confirm no concern regarding pregnancy.  If no, then take clonazepam for now - to help ease current symptoms.  See if she would be agreeable to meet with Dr Nicolasa Ducking for medication assistance, given intolerance to previous, etc.  (this would also help me to know what would be best choice for her current symptoms).  If agreeable, let me know and I will place the order.

## 2019-12-23 NOTE — Telephone Encounter (Signed)
Patient started wellbutrin on Saturday night. Has only been taking 1/2 of the dose because she was nervous about starting. Was taking at night up until yesterday morning. When she took it yesterday morning she said she started feeling jittery and nervous- described to be me that she "felt like she had drank a lot of caffeine." Was not sure if this was a medication that should make her feel like she has more energy. She thinks that the side effects are being triggered because she was already anxious prior to starting and she is not sure how this medication should make her feel or if this is normal for the first few days, etc. She previously tried buspar and did not tolerate. Still using clonazepam for anxiety. She did note that she did not necessarily want to switch medications if these side effects are normal for the first few days because this is one of the medications that she will be able to take if she gets pregnant.

## 2019-12-23 NOTE — Telephone Encounter (Signed)
Advised patient of below. She does not want to see psychiatry. Advised that she may just need a consult to determine best treatment options. Offered to refer to a different psychiatrist. Patient still declined.

## 2019-12-23 NOTE — Telephone Encounter (Signed)
Pt agreed to schedule, but did not have her calendar so she is going to call back to schedule.

## 2019-12-23 NOTE — Telephone Encounter (Signed)
Pt asked to get a call from PCP or nurse about side effects from buPROPion (WELLBUTRIN) 75 MG tablet . Pt is jittery and has nervous energy. Pt states she had a panic attack yesterday and wants to know if she should discontinue. Pt states that her heart rate started the panic attack and still feeling the effects today.

## 2020-01-08 ENCOUNTER — Other Ambulatory Visit: Payer: Self-pay | Admitting: Internal Medicine

## 2020-02-10 ENCOUNTER — Ambulatory Visit: Payer: BC Managed Care – PPO | Admitting: Internal Medicine

## 2020-02-16 ENCOUNTER — Telehealth: Payer: Self-pay | Admitting: Internal Medicine

## 2020-02-16 NOTE — Telephone Encounter (Signed)
[facility] TELEPHONE ADVICE RECORD [facility] Patient Name: Sara Richardson Gender: Female DOB: 13-Jun-1991 Age: 29 Y 40 M 8 D Return Phone Number: [phone] (Primary) Address: City/State/Zip: [place] [address] Client [facility] Client Site [facility] Physician Einar Pheasant - MD Contact Type Call Who Is Calling Patient / Member / Family / Caregiver Call Type Triage / Clinical Relationship To Patient Self Return Phone Number [phone] (Primary) Chief Complaint Bee, Wasp, or Yellow Jacket Sting (no allergic symptoms) Reason for Call Symptomatic / Request for [name] states that she is having a side effect from a bee sting from Sunday. She is experiencing a swollen lymph node and it is very sore. Translation No Nurse Assessment Nurse: Markus Daft, RN, [place] Date/Time (Eastern Time): 02/16/2020 6:56:41 AM Confirm and document reason for call. If symptomatic, describe symptoms. ---Caller states that she has a swollen painful lymph node behind her left ear, and seems this am to go down in size. No redness. Denies earache, jaw pain, or SOB. s/p bee sting on top of head on Sunday. Woke up on Monday with swollen lymph node. Has the patient had close contact with a person known or suspected to have the novel coronavirus illness OR traveled / lives in area with major community spread (including international travel) in the last 14 days from the onset of symptoms? * If Asymptomatic, screen for exposure and travel within the last 14 days. ---No Does the patient have any new or worsening symptoms? ---Yes Will a triage be completed? ---Yes Related visit to physician within the last 2 weeks? ---No Does the PT have any chronic conditions? (i.e. diabetes, asthma, this includes High risk factors for pregnancy, etc.) ---No Is the patient  pregnant or possibly pregnant? (Ask all females between the ages of 6-55) ---No Is this a behavioral health or substance abuse call? ---NoPLEASE NOTE: All timestamps contained within this report are represented as [place] Standard Time. CONFIDENTIALTY NOTICE: This fax transmission is intended only for the addressee. It contains information that is legally privileged, confidential or otherwise protected from use or disclosure. If you are not the intended recipient, you are strictly prohibited from reviewing, disclosing, copying using or disseminating any of this information or taking any action in reliance on or regarding this information. If you have received this fax in error, please notify us immediately by telephone so that we can arrange for its return to [place]. Phone: [phone], Toll-Free: [phone], Fax: [phone] Page: 2 of 2 Call Id: [id number] Guidelines Guideline Title Affirmed Question Affirmed Notes Nurse Date/Time Eilene Ghazi Time) Bee or Yellow Jacket Sting Normal local reaction to bee, wasp, or yellow jacket sting Vivianne Master, Sherre Poot 02/16/2020 6:59:30 AM Disp. Time Eilene Ghazi Time) Disposition Final User 02/16/2020 7:03:51 AM [place], RN, Sherre Poot Caller Disagree/Comply Comply Caller Understands Yes PreDisposition Did not know what to do Care Advice Given Per Guideline HOME CARE: * You should be able to treat this at home. REASSURANCE AND EDUCATION: * It sounds like a routine bee (wasp, yellow jacket) sting that we can treat at home. * The main symptoms are localized pain, swelling, itching, and mild redness at the sting site. EXPECTED COURSE: * Redness and Swelling: Normal redness and swelling from the venom can increase for 24 hours following the sting. Redness at the sting site is normal. It doesn't mean that it is infected. The redness can last 3  days and the swelling 7 days. * Stings only rarely get infected. CALL BACK IF: * Swelling becomes huge * Sting begins to  look infected * Develops difficulty breathing or swallowing (mainly during the 2 hours after the sting; Call 911) * You become worse. CARE ADVICE given per Bee or Yellow Jacket Sting (Adult) guideline. Comments User: Mayford Knife, RN Date/Time Eilene Ghazi Time): 02/16/2020 7:02:29 AM

## 2020-02-16 NOTE — Telephone Encounter (Signed)
Agree if getting better, can monitor.  If feels needs evaluation - ok to schedule.  If persistent or any change in symptoms, needs evaluated.

## 2020-02-16 NOTE — Telephone Encounter (Signed)
Patient Was stung by bee on Sunday and now has a swollen lymph node near sting site, the lymph node has started to go down using ice alternated with heat since this morning and patient has no other symptoms . Advised if any difficulty speak or breathing occurs to go to nearest ER, but to continue current therapy and if not completely resolved in 48 to 72 hours to call us back.

## 2020-02-16 NOTE — Telephone Encounter (Signed)
Patient aware and will let us know if she feels she needs to schedule

## 2020-02-18 ENCOUNTER — Other Ambulatory Visit: Payer: Self-pay

## 2020-02-18 ENCOUNTER — Ambulatory Visit: Payer: BC Managed Care – PPO | Admitting: Internal Medicine

## 2020-02-18 DIAGNOSIS — R591 Generalized enlarged lymph nodes: Secondary | ICD-10-CM

## 2020-02-18 DIAGNOSIS — T63441A Toxic effect of venom of bees, accidental (unintentional), initial encounter: Secondary | ICD-10-CM | POA: Diagnosis not present

## 2020-02-18 NOTE — Progress Notes (Signed)
Patient ID: Sara Richardson, female   DOB: 26-Nov-1990, 29 y.o.   MRN: [id number]   Subjective:    Patient ID: Sara Richardson, female    DOB: 1990-12-15, 29 y.o.   MRN: [id number]  HPI This visit occurred during the SARS-CoV-2 public health emergency.  Safety protocols were in place, including screening questions prior to the visit, additional usage of staff PPE, and extensive cleaning of exam room while observing appropriate contact time as indicated for disinfecting solutions.  Patient here for work in appt.  States she was stung (bee) - Sunday PM - 02/13/20.  Noticed some increased pain behind her left ear and extending down her neck.  Raised - lymph node.  Taking ibuprofen and using an ice pack.  Continued ibuprofen and heat.  Soreness is better.  Still with swollen lymph node.  No headache.  No fever.  No other rash.  No sinus pressure, sore throat, lip or tongue swelling.  No difficulty breathing.     Past Medical History:  Diagnosis Date  . Anxiety   . SVT (supraventricular tachycardia) (HCC)    Past Surgical History:  Procedure Laterality Date  . ABLATION    . NO PAST SURGERIES     Family History  Problem Relation Age of Onset  . Breast cancer Maternal Grandmother        lung cancer   . Prostate cancer Maternal Grandfather    Social History   Socioeconomic History  . Marital status: Married    Spouse name: Not on file  . Number of children: Not on file  . Years of education: Not on file  . Highest education level: Not on file  Occupational History  . 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 Smoking status: Never Smoker  . Smokeless tobacco: Never Used  Substance and Sexual Activity  . Alcohol use: Yes    Comment: occ  . Drug use: No  . Sexual activity: Yes    Partners: Male    Birth control/protection: Condom  Other Topics Concern  . Not on file  Social History Narrative  . Not on file   Social Determinants of Health   Financial  Resource Strain:   . Difficulty of Paying Living Expenses:   Food Insecurity:   . Worried About Charity fundraiser in the Last Year:   . Arboriculturist in the Last Year:   Transportation Needs:   . Film/video editor (Medical):   Marland Kitchen Lack of Transportation (Non-Medical):   Physical Activity:   . Days of Exercise per Week:   . Minutes of Exercise per Session:   Stress:   . Feeling of Stress :   Social Connections:   . Frequency of Communication with Friends and Family:   . Frequency of Social Gatherings with Friends and Family:   . Attends Religious Services:   . Active Member of Clubs or Organizations:   . Attends Archivist Meetings:   Marland Kitchen Marital Status:     Outpatient Encounter Medications as of 02/18/2020  Medication Sig  . Black Elderberry,Berry-Flower, 575 MG CAPS Take 1 capsule by mouth as directed.  . clonazePAM (KLONOPIN) 0.5 MG tablet Take 1 tablet (0.5 mg total) by mouth daily as needed for anxiety.  . prenatal vitamin w/FE, FA (NATACHEW) 29-1 MG CHEW chewable tablet Chew 1 tablet by mouth daily at 12 noon.  . [DISCONTINUED] buPROPion (WELLBUTRIN) 75 MG tablet TAKE 1 TABLET  BY MOUTH TWICE A DAY   No facility-administered encounter medications on file as of 02/18/2020.    Review of Systems  Constitutional: Negative for appetite change and fever.  HENT: Negative for congestion, sinus pressure and sore throat.   Respiratory: Negative for cough, chest tightness and shortness of breath.   Cardiovascular: Negative for chest pain and leg swelling.  Gastrointestinal: Negative for diarrhea, nausea and vomiting.  Musculoskeletal: Negative for myalgias.  Skin: Negative for color change and rash.  Neurological: Negative for dizziness, light-headedness and headaches.  Psychiatric/Behavioral: Negative for agitation and dysphoric mood.       Objective:    Physical Exam Vitals reviewed.  Constitutional:      General: She is not in acute distress.    Appearance:  Normal appearance.  HENT:     Head: Normocephalic and atraumatic.     Right Ear: External ear normal.     Left Ear: External ear normal.     Nose: Nose normal. No congestion.     Mouth/Throat:     Pharynx: No oropharyngeal exudate or posterior oropharyngeal erythema.     Comments: No lip or tongue swelling.  Eyes:     General: No scleral icterus.       Right eye: No discharge.        Left eye: No discharge.     Conjunctiva/sclera: Conjunctivae normal.  Neck:     Comments: Anterior cervical lymph node - enlarged.  Minimal tenderness to palpation.  No neck stiffness.   Cardiovascular:     Rate and Rhythm: Normal rate and regular rhythm.  Pulmonary:     Effort: Pulmonary effort is normal. No respiratory distress.     Breath sounds: Normal breath sounds.  Musculoskeletal:     Cervical back: Neck supple.  Lymphadenopathy:     Cervical: Cervical adenopathy present.  Skin:    Findings: No erythema or rash.  Neurological:     Mental Status: She is alert.  Psychiatric:        Mood and Affect: Mood normal.        Behavior: Behavior normal.     There were no vitals taken for this visit. Wt Readings from Last 3 Encounters:  12/16/19 140 lb (63.5 kg)  07/15/19 134 lb 8 oz (61 kg)  07/02/19 133 lb 6.4 oz (60.5 kg)     Lab Results  Component Value Date   WBC 19.4 (H) 05/17/2019   HGB 14.6 05/17/2019   HCT 44.2 05/17/2019   PLT 321 05/17/2019   GLUCOSE 121 (H) 05/17/2019   ALT 16 05/17/2019   AST 27 05/17/2019   NA 140 05/17/2019   K 3.4 (L) 05/17/2019   CL 107 05/17/2019   CREATININE 0.64 05/17/2019   BUN 7 05/17/2019   CO2 23 05/17/2019       Assessment & Plan:   Problem List Items Addressed This Visit    Bee sting    Bee sting and now with persistent enlarged lymph node.  Scalp clear.  No lesions.  No lip or tongue swelling.        Lymphadenopathy    Enlarged lymph node as outlined.  Still tender to palpation, but overall soreness improved.  No lip or tongue  swelling.  No other local reaction.  Continue gentle use of antiinflammatories.  Follow closely.  Call with update next week.           Einar Pheasant, MD

## 2020-02-20 ENCOUNTER — Encounter: Payer: Self-pay | Admitting: Internal Medicine

## 2020-02-20 DIAGNOSIS — T63441A Toxic effect of venom of bees, accidental (unintentional), initial encounter: Secondary | ICD-10-CM | POA: Insufficient documentation

## 2020-02-20 DIAGNOSIS — R591 Generalized enlarged lymph nodes: Secondary | ICD-10-CM | POA: Insufficient documentation

## 2020-02-20 NOTE — Assessment & Plan Note (Signed)
Bee sting and now with persistent enlarged lymph node.  Scalp clear.  No lesions.  No lip or tongue swelling.

## 2020-02-20 NOTE — Assessment & Plan Note (Signed)
Enlarged lymph node as outlined.  Still tender to palpation, but overall soreness improved.  No lip or tongue swelling.  No other local reaction.  Continue gentle use of antiinflammatories.  Follow closely.  Call with update next week.

## 2020-02-21 ENCOUNTER — Encounter: Payer: Self-pay | Admitting: Internal Medicine

## 2020-07-18 ENCOUNTER — Other Ambulatory Visit: Payer: Self-pay

## 2020-07-18 ENCOUNTER — Encounter: Payer: Self-pay | Admitting: Certified Nurse Midwife

## 2020-07-18 ENCOUNTER — Ambulatory Visit (INDEPENDENT_AMBULATORY_CARE_PROVIDER_SITE_OTHER): Payer: BC Managed Care – PPO | Admitting: Certified Nurse Midwife

## 2020-07-18 VITALS — BP 132/76 | HR 110 | Ht 62.0 in | Wt 137.1 lb

## 2020-07-18 DIAGNOSIS — Z01419 Encounter for gynecological examination (general) (routine) without abnormal findings: Secondary | ICD-10-CM | POA: Diagnosis not present

## 2020-07-18 NOTE — Patient Instructions (Signed)
Preventive Care 20-28 Years Old, Female Preventive care refers to visits with your health care provider and lifestyle choices that can promote health and wellness. This includes:  A yearly physical exam. This may also be called an annual well check.  Regular dental visits and eye exams.  Immunizations.  Screening for certain conditions.  Healthy lifestyle choices, such as eating a healthy diet, getting regular exercise, not using drugs or products that contain nicotine and tobacco, and limiting alcohol use. What can I expect for my preventive care visit? Physical exam Your health care provider will check your:  Height and weight. This may be used to calculate body mass index (BMI), which tells if you are at a healthy weight.  Heart rate and blood pressure.  Skin for abnormal spots. Counseling Your health care provider may ask you questions about your:  Alcohol, tobacco, and drug use.  Emotional well-being.  Home and relationship well-being.  Sexual activity.  Eating habits.  Work and work Statistician.  Method of birth control.  Menstrual cycle.  Pregnancy history. What immunizations do I need?  Influenza (flu) vaccine  This is recommended every year. Tetanus, diphtheria, and pertussis (Tdap) vaccine  You may need a Td booster every 10 years. Varicella (chickenpox) vaccine  You may need this if you have not been vaccinated. Human papillomavirus (HPV) vaccine  If recommended by your health care provider, you may need three doses over 6 months. Measles, mumps, and rubella (MMR) vaccine  You may need at least one dose of MMR. You may also need a second dose. Meningococcal conjugate (MenACWY) vaccine  One dose is recommended if you are age 75-21 years and a first-year college student living in a residence hall, or if you have one of several medical conditions. You may also need additional booster doses. Pneumococcal conjugate (PCV13) vaccine  You may need  this if you have certain conditions and were not previously vaccinated. Pneumococcal polysaccharide (PPSV23) vaccine  You may need one or two doses if you smoke cigarettes or if you have certain conditions. Hepatitis A vaccine  You may need this if you have certain conditions or if you travel or work in places where you may be exposed to hepatitis A. Hepatitis B vaccine  You may need this if you have certain conditions or if you travel or work in places where you may be exposed to hepatitis B. Haemophilus influenzae type b (Hib) vaccine  You may need this if you have certain conditions. You may receive vaccines as individual doses or as more than one vaccine together in one shot (combination vaccines). Talk with your health care provider about the risks and benefits of combination vaccines. What tests do I need?  Blood tests  Lipid and cholesterol levels. These may be checked every 5 years starting at age 33.  Hepatitis C test.  Hepatitis B test. Screening  Diabetes screening. This is done by checking your blood sugar (glucose) after you have not eaten for a while (fasting).  Sexually transmitted disease (STD) testing.  BRCA-related cancer screening. This may be done if you have a family history of breast, ovarian, tubal, or peritoneal cancers.  Pelvic exam and Pap test. This may be done every 3 years starting at age 76. Starting at age 102, this may be done every 5 years if you have a Pap test in combination with an HPV test. Talk with your health care provider about your test results, treatment options, and if necessary, the need for more tests.  Follow these instructions at home: Eating and drinking   Eat a diet that includes fresh fruits and vegetables, whole grains, lean protein, and low-fat dairy.  Take vitamin and mineral supplements as recommended by your health care provider.  Do not drink alcohol if: ? Your health care provider tells you not to drink. ? You are  pregnant, may be pregnant, or are planning to become pregnant.  If you drink alcohol: ? Limit how much you have to 0-1 drink a day. ? Be aware of how much alcohol is in your drink. In the [place] one drink equals one 12 oz bottle of beer (355 mL), one 5 oz glass of wine (148 mL), or one 1 oz glass of hard liquor (44 mL). Lifestyle  Take daily care of your teeth and gums.  Stay active. Exercise for at least 30 minutes on 5 or more days each week.  Do not use any products that contain nicotine or tobacco, such as cigarettes, e-cigarettes, and chewing tobacco. If you need help quitting, ask your health care provider.  If you are sexually active, practice safe sex. Use a condom or other form of birth control (contraception) in order to prevent pregnancy and STIs (sexually transmitted infections). If you plan to become pregnant, see your health care provider for a preconception visit. What's next?  Visit your health care provider once a year for a well check visit.  Ask your health care provider how often you should have your eyes and teeth checked.  Stay up to date on all vaccines. This information is not intended to replace advice given to you by your health care provider. Make sure you discuss any questions you have with your health care provider. Document Revised: 07/09/2018 Document Reviewed: 07/09/2018 Elsevier Patient Education  2020 [employer].

## 2020-07-18 NOTE — Progress Notes (Addendum)
GYNECOLOGY ANNUAL PREVENTATIVE CARE ENCOUNTER NOTE  History:     Sara Richardson is a 29 y.o. G0P0000 female here for a routine annual gynecologic exam.  Current complaints: none   Denies abnormal vaginal bleeding, discharge, pelvic pain, problems with intercourse or other gynecologic concerns.     Social Relationship: Married  Living: with husband Work:FT-Teacher, specialist with reading in math Exercise:3x wk Smoke/Alcohol/drug NGE:XBMW alcohol use.  Gynecologic History Patient's last menstrual period was 07/11/2020 (approximate). Contraception: condoms Last Pap: 07/15/2019. Results were: normal  Last mammogram: n/a   Obstetric History OB History  Gravida Para Term Preterm AB Living  0 0 0 0 0 0  SAB TAB Ectopic Multiple Live Births  0 0 0 0 0    Past Medical History:  Diagnosis Date  . Anxiety   . SVT (supraventricular tachycardia) (HCC)     Past Surgical History:  Procedure Laterality Date  . ABLATION    . NO PAST SURGERIES    ( cardia ablation for SVT)  Current Outpatient Medications on File Prior to Visit  Medication Sig Dispense Refill  . Black Elderberry,Berry-Flower, 575 MG CAPS Take 1 capsule by mouth as directed.    . clonazePAM (KLONOPIN) 0.5 MG tablet Take 1 tablet (0.5 mg total) by mouth daily as needed for anxiety. 30 tablet 0  . prenatal vitamin w/FE, FA (NATACHEW) 29-1 MG CHEW chewable tablet Chew 1 tablet by mouth daily at 12 noon.     No current facility-administered medications on file prior to visit.    Allergies  Allergen Reactions  . Sulfamethoxazole-Trimethoprim Hives and Itching    Social History:  reports that she has never smoked. She has never used smokeless tobacco. She reports previous alcohol use. She reports that she does not use drugs.  Family History  Problem Relation Age of Onset  . Breast cancer Maternal Grandmother        lung cancer   . Prostate cancer Maternal Grandfather     The following portions of the  patient's history were reviewed and updated as appropriate: allergies, current medications, past family history, past medical history, past social history, past surgical history and problem list.  Review of Systems Pertinent items noted in HPI and remainder of comprehensive ROS otherwise negative.  Physical Exam:  BP 132/76   Pulse (!) 110   Ht 5\' 2"  (1.575 m)   Wt 137 lb 1.6 oz (62.2 kg)   LMP 07/11/2020 (Approximate)   BMI 25.08 kg/m  CONSTITUTIONAL: Well-developed, well-nourished female in no acute distress.  HENT:  Normocephalic, atraumatic, External right and left ear normal. Oropharynx is clear and moist EYES: Conjunctivae and EOM are normal. Pupils are equal, round, and reactive to light. No scleral icterus.  NECK: Normal range of motion, supple, no masses.  Normal thyroid.  SKIN: Skin is warm and dry. No rash noted. Not diaphoretic. No erythema. No pallor. MUSCULOSKELETAL: Normal range of motion. No tenderness.  No cyanosis, clubbing, or edema.  2+ distal pulses. NEUROLOGIC: Alert and oriented to person, place, and time. Normal reflexes, muscle tone coordination.  PSYCHIATRIC: Normal mood and affect. Normal behavior. Normal judgment and thought content. CARDIOVASCULAR: Normal heart rate noted, regular rhythm RESPIRATORY: Clear to auscultation bilaterally. Effort and breath sounds normal, no problems with respiration noted. BREASTS: Symmetric in size. No masses, tenderness, skin changes, nipple drainage, or lymphadenopathy bilaterally.  ABDOMEN: Soft, no distention noted.  No tenderness, rebound or guarding.  PELVIC: Normal appearing external genitalia and urethral meatus; normal appearing  vaginal mucosa and cervix.  No abnormal discharge noted.  Pap smear not indicated.  Normal uterine size, no other palpable masses, no uterine or adnexal tenderness.  .   Assessment and Plan:    1. Women's annual routine gynecological examination  Pap:not due Mammogram :n/a  Labs:declines labs  and tetnus shot.  Refills:none Referral:none Routine preventative health maintenance measures emphasized. Please refer to After Visit Summary for other counseling recommendations.      Philip Aspen, CNM [facility]

## 2020-10-08 ENCOUNTER — Other Ambulatory Visit: Payer: Self-pay

## 2020-10-09 MED ORDER — CLONAZEPAM 0.5 MG PO TABS: 0.5000 mg | ORAL_TABLET | Freq: Every day | ORAL | 0 refills | Status: DC | PRN

## 2020-10-09 NOTE — Telephone Encounter (Signed)
Patient likes to have on hand. Hers are expired. She rarely uses and is not having any issues. Not pregnant or breast feeding. Also stated that she would be ok doing a lesser quantity if needed. She did not need 30 last time.

## 2020-10-09 NOTE — Telephone Encounter (Signed)
rx sent in for clonazepam #20 with no refills.   

## 2020-10-09 NOTE — Telephone Encounter (Signed)
Last clonazepam was refilled by Dr Derrel Nip 01/2019.  Does she need?  Any acute concerns?  Also need to make sure not pregnant and not breast feeding.  Would like to use something different if havig issues.  Can schedule an appt to discuss if needed.

## 2020-10-24 DIAGNOSIS — Z113 Encounter for screening for infections with a predominantly sexual mode of transmission: Secondary | ICD-10-CM

## 2020-10-24 DIAGNOSIS — Z13220 Encounter for screening for lipoid disorders: Secondary | ICD-10-CM

## 2020-11-11 NOTE — L&D Delivery Note (Signed)
       Delivery Note   Sara Richardson is a 30 y.o. G1P0000 at [redacted]w[redacted]d Estimated Date of Delivery: 08/31/21  PRE-OPERATIVE DIAGNOSIS:  1) [redacted]w[redacted]d pregnancy.  2) gestational hypertension 3) complex adnexal mass  POST-OPERATIVE DIAGNOSIS:  1) [redacted]w[redacted]d pregnancy s/p Vaginal, Spontaneous  2) compound presentation with left arm  SAME with viable female infant  Delivery Type: Vaginal, Spontaneous    Delivery Anesthesia: Epidural;Local   Labor Complications:      ESTIMATED BLOOD LOSS: 260  ml    FINDINGS:   1) female infant, Apgar scores of 8   at 1 minute and 9   at 5 minutes and a birthweight of   ounces.    2) Nuchal cord: yes  SPECIMENS:   PLACENTA:   Appearance: Intact    Removal: Spontaneous      Disposition:    DISPOSITION:  Infant to left in stable condition in the delivery room, with L&D personnel and mother,  NARRATIVE SUMMARY: Labor course:  [name] is a G1P0000 at [redacted]w[redacted]d who presented for induction of labor for gestational hypertension.  She was begun on misoprostol and received 2 doses.  She progressed with this dosing schedule and underwent spontaneous rupture membranes.  Clear fluid was noted.  She received an epidural for anesthesia.  She got to approximately 7 cm dilation and required augmentation of her contractions with Pitocin.  She evidenced good maternal expulsive effort during the second stage. She went on to deliver a viable infant. The placenta delivered without problems and was noted to be complete. A perineal and vaginal examination was performed. Episiotomy/Lacerations: 2nd degree;Labial   she had extensive bilateral labial tears and a small midline tear. Episiotomy or lacerations were repaired with Vicryl suture using local anesthesia. The patient tolerated this well.  Finis Bud, M.D. 08/29/2021 5:02 PM

## 2020-12-12 ENCOUNTER — Encounter: Payer: Self-pay | Admitting: Internal Medicine

## 2020-12-12 ENCOUNTER — Other Ambulatory Visit: Payer: Self-pay

## 2020-12-12 ENCOUNTER — Ambulatory Visit (INDEPENDENT_AMBULATORY_CARE_PROVIDER_SITE_OTHER): Payer: BC Managed Care – PPO | Admitting: Internal Medicine

## 2020-12-12 VITALS — BP 118/80 | HR 96 | Temp 98.8°F | Ht 62.0 in | Wt 140.0 lb

## 2020-12-12 DIAGNOSIS — Z0000 Encounter for general adult medical examination without abnormal findings: Secondary | ICD-10-CM | POA: Diagnosis not present

## 2020-12-12 DIAGNOSIS — F419 Anxiety disorder, unspecified: Secondary | ICD-10-CM

## 2020-12-12 DIAGNOSIS — I471 Supraventricular tachycardia, unspecified: Secondary | ICD-10-CM

## 2020-12-12 NOTE — Assessment & Plan Note (Addendum)
physial today 12/12/20.  Sees gyn for breast, pelvic and pap smears.

## 2020-12-12 NOTE — Progress Notes (Signed)
Patient ID: Sara Richardson, female   DOB: 03-15-1991, 30 y.o.   MRN: [id number]   Subjective:    Patient ID: Sara Richardson, female    DOB: Apr 24, 1991, 30 y.o.   MRN: [id number]  HPI This visit occurred during the SARS-CoV-2 public health emergency.  Safety protocols were in place, including screening questions prior to the visit, additional usage of staff PPE, and extensive cleaning of exam room while observing appropriate contact time as indicated for disinfecting solutions.  Patient here for her physical exam.  She sees gyn for her breast, pelvic and pap smears.  Has a history of SVT.  Has been relatively stable.  Has had a couple of episodes.  First episode occurred when riding. Second episode occurred during yoga.  No chest pain or sob with increased activity or exertion.  No increased stress.  Episode lasted a few minutes.  No other episodes.  Discussed further w/up.  Wants to monitor.  No acid reflux reported. No abdominal pain.  Bowels moving.  Handling stress.  School is going well.   Past Medical History:  Diagnosis Date  . Anxiety   . SVT (supraventricular tachycardia) (HCC)    Past Surgical History:  Procedure Laterality Date  . ABLATION    . NO PAST SURGERIES     Family History  Problem Relation Age of Onset  . Breast cancer Maternal Grandmother        lung cancer   . Prostate cancer Maternal Grandfather    Social History   Socioeconomic History  . Marital status: Married    Spouse name: Not on file  . Number of children: Not on file  . Years of education: Not on file  . Highest education level: Not on file  Occupational History  . 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 Smoking status: Never Smoker  . Smokeless tobacco: Never Used  Vaping Use  . Vaping Use: Never used  Substance and Sexual Activity  . Alcohol use: Not Currently  . Drug use: No  . Sexual activity: Yes    Partners: Male    Birth control/protection: None  Other  Topics Concern  . Not on file  Social History Narrative  . Not on file   Social Determinants of Health   Financial Resource Strain: Not on file  Food Insecurity: Not on file  Transportation Needs: Not on file  Physical Activity: Not on file  Stress: Not on file  Social Connections: Not on file    Outpatient Encounter Medications as of 12/12/2020  Medication Sig  . cholecalciferol (VITAMIN D3) 25 MCG (1000 UNIT) tablet Take 1,000 Units by mouth daily.  . vitamin B-12 (CYANOCOBALAMIN) 100 MCG tablet Take 100 mcg by mouth daily.  . Black Elderberry,Berry-Flower, 575 MG CAPS Take 1 capsule by mouth as directed.  . clonazePAM (KLONOPIN) 0.5 MG tablet Take 1 tablet (0.5 mg total) by mouth daily as needed for anxiety.  . prenatal vitamin w/FE, FA (NATACHEW) 29-1 MG CHEW chewable tablet Chew 1 tablet by mouth daily at 12 noon.   No facility-administered encounter medications on file as of 12/12/2020.    Review of Systems  Constitutional: Negative for appetite change and unexpected weight change.  HENT: Negative for congestion, sinus pressure and sore throat.   Eyes: Negative for pain and visual disturbance.  Respiratory: Negative for cough, chest tightness and shortness of breath.   Cardiovascular: Negative for chest pain, palpitations and leg swelling.  A couple of episodes of SVT as outlined.   Gastrointestinal: Negative for abdominal pain, diarrhea, nausea and vomiting.  Genitourinary: Negative for difficulty urinating and dysuria.  Musculoskeletal: Negative for back pain and joint swelling.  Skin: Negative for color change and rash.  Neurological: Negative for dizziness, light-headedness and headaches.  Hematological: Negative for adenopathy. Does not bruise/bleed easily.  Psychiatric/Behavioral: Negative for agitation and dysphoric mood.       Objective:    Physical Exam Vitals reviewed.  Constitutional:      General: She is not in acute distress.    Appearance: Normal  appearance.  HENT:     Head: Normocephalic and atraumatic.     Right Ear: External ear normal.     Left Ear: External ear normal.     Mouth/Throat:     Mouth: Oropharynx is clear and moist.  Eyes:     General: No scleral icterus.       Right eye: No discharge.        Left eye: No discharge.     Conjunctiva/sclera: Conjunctivae normal.  Neck:     Thyroid: No thyromegaly.  Cardiovascular:     Rate and Rhythm: Normal rate and regular rhythm.  Pulmonary:     Effort: No respiratory distress.     Breath sounds: Normal breath sounds. No wheezing.  Abdominal:     General: Bowel sounds are normal.     Palpations: Abdomen is soft.     Tenderness: There is no abdominal tenderness.  Musculoskeletal:        General: No swelling, tenderness or edema.     Cervical back: Neck supple. No tenderness.  Lymphadenopathy:     Cervical: No cervical adenopathy.  Skin:    Findings: No erythema or rash.  Neurological:     Mental Status: She is alert.  Psychiatric:        Mood and Affect: Mood normal.        Behavior: Behavior normal.     BP 118/80   Pulse 96   Temp 98.8 F (37.1 C)   Ht 5\' 2"  (1.575 m)   Wt 140 lb (63.5 kg)   SpO2 99%   BMI 25.61 kg/m  Wt Readings from Last 3 Encounters:  12/12/20 140 lb (63.5 kg)  07/18/20 137 lb 1.6 oz (62.2 kg)  02/21/20 138 lb 12.8 oz (63 kg)     Lab Results  Component Value Date   WBC 19.4 (H) 05/17/2019   HGB 14.6 05/17/2019   HCT 44.2 05/17/2019   PLT 321 05/17/2019   GLUCOSE 121 (H) 05/17/2019   ALT 16 05/17/2019   AST 27 05/17/2019   NA 140 05/17/2019   K 3.4 (L) 05/17/2019   CL 107 05/17/2019   CREATININE 0.64 05/17/2019   BUN 7 05/17/2019   CO2 23 05/17/2019      Assessment & Plan:   Problem List Items Addressed This Visit    Anxiety    Overall doing well.  Handling stress.  On no medication.  Follow.       Healthcare maintenance    physial today 12/12/20.  Sees gyn for breast, pelvic and pap smears.        SVT  (supraventricular tachycardia) ([place])    Has a history of SVT.  Had a couple of episodes of SVT.  Brief episodes.  Discussed further w/up and evaluation.  Wants to monitor.  Follow.        Other Visit Diagnoses    Routine  general medical examination at a health care facility    -  Primary       Einar Pheasant, MD

## 2020-12-17 ENCOUNTER — Encounter: Payer: Self-pay | Admitting: Internal Medicine

## 2020-12-17 NOTE — Assessment & Plan Note (Signed)
Overall doing well.  Handling stress.  On no medication.  Follow.  

## 2020-12-17 NOTE — Assessment & Plan Note (Signed)
Has a history of SVT.  Had a couple of episodes of SVT.  Brief episodes.  Discussed further w/up and evaluation.  Wants to monitor.  Follow.

## 2021-01-03 ENCOUNTER — Encounter: Payer: Self-pay | Admitting: Certified Nurse Midwife

## 2021-01-03 ENCOUNTER — Other Ambulatory Visit: Payer: Self-pay

## 2021-01-03 ENCOUNTER — Ambulatory Visit (INDEPENDENT_AMBULATORY_CARE_PROVIDER_SITE_OTHER): Payer: Self-pay | Admitting: Certified Nurse Midwife

## 2021-01-03 VITALS — BP 123/86 | HR 104 | Ht 62.0 in | Wt 133.4 lb

## 2021-01-03 DIAGNOSIS — N912 Amenorrhea, unspecified: Secondary | ICD-10-CM

## 2021-01-03 DIAGNOSIS — O219 Vomiting of pregnancy, unspecified: Secondary | ICD-10-CM

## 2021-01-03 LAB — POCT URINE PREGNANCY: Preg Test, Ur: POSITIVE — AB

## 2021-01-03 MED ORDER — DOXYLAMINE-PYRIDOXINE 10-10 MG PO TBEC: 10.0000 mg | DELAYED_RELEASE_TABLET | Freq: Every day | ORAL | 1 refills | Status: DC

## 2021-01-03 NOTE — Patient Instructions (Signed)
Morning Sickness  Morning sickness is when you feel like you may vomit (feel nauseous) during pregnancy. Sometimes, you may vomit. Morning sickness most often happens in the morning, but it can also happen at any time of the day. Some women may have morning sickness that makes them vomit all the time. This is a more serious problem that needs treatment. What are the causes? The cause of this condition is not known. What increases the risk?  You had vomiting or a feeling like you may vomit before your pregnancy.  You had morning sickness in another pregnancy.  You are pregnant with more than one baby, such as twins. What are the signs or symptoms?  Feeling like you may vomit.  Vomiting. How is this treated? Treatment is usually not needed for this condition. You may only need to change what you eat. In some cases, your doctor may give you some things to take for your condition. These include:  Vitamin B6 supplements.  Medicines to treat the feeling that you may vomit.  Ginger. Follow these instructions at home: Medicines  Take over-the-counter and prescription medicines only as told by your doctor. Do not take any medicines until you talk with your doctor about them first.  Take multivitamins before you get pregnant. These can stop or lessen the symptoms of morning sickness. Eating and drinking  Eat dry toast or crackers before getting out of bed.  Eat 5 or 6 small meals a day.  Eat dry and bland foods like rice and baked potatoes.  Do not eat greasy, fatty, or spicy foods.  Have someone cook for you if the smell of food causes you to vomit or to feel like you may vomit.  If you feel like you may vomit after taking prenatal vitamins, take them at night or with a snack.  Eat protein foods when you need a snack. Nuts, yogurt, and cheese are good choices.  Drink fluids throughout the day.  Try ginger ale made with real ginger, ginger tea made from fresh grated ginger, or  ginger candies. General instructions  Do not smoke or use any products that contain nicotine or tobacco. If you need help quitting, ask your doctor.  Use an air purifier to keep the air in your house free of smells.  Get lots of fresh air.  Try to avoid smells that make you feel sick.  Try wearing an acupressure wristband. This is a wristband that is used to treat seasickness.  Try a treatment called acupuncture. In this treatment, a doctor puts needles into certain areas of your body to make you feel better. Contact a doctor if:  You need medicine to feel better.  You feel dizzy or light-headed.  You are losing weight. Get help right away if:  The feeling that you may vomit will not go away, or you cannot stop vomiting.  You faint.  You have very bad pain in your belly. Summary  Morning sickness is when you feel like you may vomit (feel nauseous) during pregnancy.  You may feel sick in the morning, but you can feel this way at any time of the day.  Making some changes to what you eat may help your symptoms go away. This information is not intended to replace advice given to you by your health care provider. Make sure you discuss any questions you have with your health care provider. Document Revised: 06/12/2020 Document Reviewed: 05/22/2020 Elsevier Patient Education  2021 [employer]. Common Medications Safe in   Pregnancy  Acne:      Constipation:  Benzoyl Peroxide     Colace  Clindamycin      Dulcolax Suppository  Topica Erythromycin     Fibercon  Salicylic Acid      Metamucil         Miralax AVOID:        Senakot   Accutane    Cough:  Retin-A       Cough Drops  Tetracycline      Phenergan w/ Codeine if Rx  Minocycline      Robitussin (Plain & DM)  Antibiotics:     Crabs/Lice:  Ceclor       RID  Cephalosporins    AVOID:  E-Mycins      Kwell  Keflex  Macrobid/Macrodantin   Diarrhea:  Penicillin      Kao-Pectate  Zithromax      Imodium AD         PUSH  FLUIDS AVOID:       Cipro     Fever:  Tetracycline      Tylenol (Regular or Extra  Minocycline       Strength)  Levaquin      Extra Strength-Do not          Exceed 8 tabs/24 hrs Caffeine:        <276m/day (equiv. To 1 cup of coffee or  approx. 3 12 oz sodas)         Gas: Cold/Hayfever:       Gas-X  Benadryl      Mylicon  Claritin       Phazyme  **Claritin-D        Chlor-Trimeton    Headaches:  Dimetapp      ASA-Free Excedrin  Drixoral-Non-Drowsy     Cold Compress  Mucinex (Guaifenasin)     Tylenol (Regular or Extra  Sudafed/Sudafed-12 Hour     Strength)  **Sudafed PE Pseudoephedrine   Tylenol Cold & Sinus     Vicks Vapor Rub  Zyrtec  **AVOID if Problems With Blood Pressure         Heartburn: Avoid lying down for at least 1 hour after meals  Aciphex      Maalox     Rash:  Milk of Magnesia     Benadryl    Mylanta       1% Hydrocortisone Cream  Pepcid  Pepcid Complete   Sleep Aids:  Prevacid      Ambien   Prilosec       Benadryl  Rolaids       Chamomile Tea  Tums (Limit 4/day)     Unisom         Tylenol PM         Warm milk-add vanilla or  Hemorrhoids:       Sugar for taste  Anusol/Anusol H.C.  (RX: Analapram 2.5%)  Sugar Substitutes:  Hydrocortisone OTC     Ok in moderation  Preparation H      Tucks        Vaseline lotion applied to tissue with wiping    Herpes:     Throat:  Acyclovir      Oragel  Famvir  Valtrex     Vaccines:         Flu Shot Leg Cramps:       *Gardasil  Benadryl      Hepatitis A         Hepatitis B Nasal Spray:  Pneumovax  Saline Nasal Spray     Polio Booster         Tetanus Nausea:       Tuberculosis test or PPD  Vitamin B6 25 mg TID   AVOID:    Dramamine      *Gardasil  Emetrol       Live Poliovirus  Ginger Root 250 mg QID    MMR (measles, mumps &  High Complex Carbs @ Bedtime    rebella)  Sea Bands-Accupressure    Varicella (Chickenpox)  Unisom 1/2 tab TID     *No known complications           If received  before Pain:         Known pregnancy;   Darvocet       Resume series after  Lortab        Delivery  Percocet    Yeast:   Tramadol      Femstat  Tylenol 3      Gyne-lotrimin  Ultram       Monistat  Vicodin           MISC:         All Sunscreens           Hair Coloring/highlights          Insect Repellant's          (Including DEET)         Mystic Tans

## 2021-01-03 NOTE — Progress Notes (Signed)
Subjective:    Sara Richardson is a 30 y.o. female who presents for evaluation of amenorrhea. She believes she could be pregnant. Pregnancy is desired. Sexual Activity: single partner, contraception: none. Current symptoms also include: nausea. Last period was normal.   Patient's last menstrual period was 11/24/2020 (exact date). The following portions of the patient's history were reviewed and updated as appropriate: allergies, current medications, past family history, past medical history, past social history, past surgical history and problem list.  Review of Systems Pertinent items are noted in HPI.     Objective:    BP 123/86   Pulse (!) 104   Ht 5\' 2"  (1.575 m)   Wt 133 lb 6.4 oz (60.5 kg)   LMP 11/24/2020 (Exact Date)   BMI 24.40 kg/m  General: alert, cooperative, appears stated age and no acute distress    Lab Review Urine HCG: positive    Assessment:    Absence of menstruation.     Plan:   Positive: EDC: 08/31/21. Briefly discussed pre-natal care options. MD or midwifery care. Pt request to see midwives.. Encouraged well-balanced diet, plenty of rest when needed, pre-natal vitamins daily and walking for exercise. Discussed self-help for nausea, avoiding OTC medications until consulting provider or pharmacist, other than Tylenol as needed, minimal caffeine (1-2 cups daily) and avoiding alcohol. Pt takes klonopin for anxiety. Discussed risks of medication in pregnancy. Pt state she takes ir rarely and that she see a therapist. She utilizes coping techniques to deal with her anxiety. States she may use it once q 6 months. She state she will let us know if she feels like anxiety is increasing and if she is taking the medication frequently. Discussed use of SSRI to help manage anxiety if this occurs. She verbalizes and agrees.  She will schedule her initial dating u/s in 2 wks, her Nurse visit at 10 wks and her NOB visit @12  wks.  Feel free to call with any questions.

## 2021-01-08 ENCOUNTER — Encounter: Payer: Self-pay | Admitting: Internal Medicine

## 2021-01-08 ENCOUNTER — Encounter: Payer: BC Managed Care – PPO | Admitting: Certified Nurse Midwife

## 2021-01-09 ENCOUNTER — Encounter: Payer: Self-pay | Admitting: Certified Nurse Midwife

## 2021-01-16 ENCOUNTER — Other Ambulatory Visit: Payer: Self-pay

## 2021-01-18 ENCOUNTER — Ambulatory Visit
Admission: RE | Admit: 2021-01-18 | Discharge: 2021-01-18 | Disposition: A | Discharge status: Home / Self Care | Payer: BC Managed Care – PPO | Source: Ambulatory Visit | Attending: Certified Nurse Midwife | Admitting: Certified Nurse Midwife

## 2021-01-18 ENCOUNTER — Other Ambulatory Visit: Payer: Self-pay

## 2021-01-18 DIAGNOSIS — N912 Amenorrhea, unspecified: Secondary | ICD-10-CM

## 2021-01-22 ENCOUNTER — Telehealth: Payer: Self-pay | Admitting: Certified Nurse Midwife

## 2021-01-22 ENCOUNTER — Other Ambulatory Visit: Payer: Self-pay | Admitting: Certified Nurse Midwife

## 2021-01-22 DIAGNOSIS — Z3A08 8 weeks gestation of pregnancy: Secondary | ICD-10-CM

## 2021-01-22 NOTE — Telephone Encounter (Signed)
I have sent a message to Dr. Amalia Hailey, regarding this finding and recommendations for follow up.   Thanks  Deneise Lever

## 2021-01-22 NOTE — Telephone Encounter (Signed)
Pt called stating that she has seen her U/S results on her mychart and has some concerns about a mass found- I did mention that provider might not have seen results yet, she is understanding. Please Advise.

## 2021-01-23 NOTE — Telephone Encounter (Signed)
No you do not. I called her yesterday and talked with her.   Thanks,  Deneise Lever

## 2021-01-26 ENCOUNTER — Other Ambulatory Visit: Payer: Self-pay | Admitting: Certified Nurse Midwife

## 2021-01-26 ENCOUNTER — Other Ambulatory Visit: Payer: Self-pay

## 2021-01-26 MED ORDER — PRENATE PIXIE 10-0.6-0.4-200 MG PO CAPS: 200.0000 mg | ORAL_CAPSULE | Freq: Every day | ORAL | 11 refills | Status: DC

## 2021-01-27 ENCOUNTER — Other Ambulatory Visit: Payer: Self-pay | Admitting: Certified Nurse Midwife

## 2021-01-27 DIAGNOSIS — R1900 Intra-abdominal and pelvic swelling, mass and lump, unspecified site: Secondary | ICD-10-CM

## 2021-01-27 DIAGNOSIS — O26899 Other specified pregnancy related conditions, unspecified trimester: Secondary | ICD-10-CM

## 2021-02-01 ENCOUNTER — Ambulatory Visit: Payer: BC Managed Care – PPO | Attending: Maternal & Fetal Medicine

## 2021-02-01 ENCOUNTER — Other Ambulatory Visit: Payer: Self-pay

## 2021-02-01 DIAGNOSIS — R1900 Intra-abdominal and pelvic swelling, mass and lump, unspecified site: Secondary | ICD-10-CM | POA: Diagnosis present

## 2021-02-01 DIAGNOSIS — O3481 Maternal care for other abnormalities of pelvic organs, first trimester: Secondary | ICD-10-CM | POA: Insufficient documentation

## 2021-02-01 DIAGNOSIS — N9489 Other specified conditions associated with female genital organs and menstrual cycle: Secondary | ICD-10-CM | POA: Insufficient documentation

## 2021-02-01 DIAGNOSIS — O99891 Other specified diseases and conditions complicating pregnancy: Secondary | ICD-10-CM | POA: Diagnosis not present

## 2021-02-01 DIAGNOSIS — O26899 Other specified pregnancy related conditions, unspecified trimester: Secondary | ICD-10-CM

## 2021-02-01 DIAGNOSIS — Z3A10 10 weeks gestation of pregnancy: Secondary | ICD-10-CM

## 2021-02-02 ENCOUNTER — Other Ambulatory Visit: Payer: Self-pay | Admitting: Certified Nurse Midwife

## 2021-02-09 ENCOUNTER — Other Ambulatory Visit: Payer: Self-pay

## 2021-02-09 ENCOUNTER — Ambulatory Visit (INDEPENDENT_AMBULATORY_CARE_PROVIDER_SITE_OTHER): Payer: BC Managed Care – PPO

## 2021-02-09 VITALS — BP 151/82 | HR 94 | Ht 62.0 in | Wt 137.9 lb

## 2021-02-09 DIAGNOSIS — Z0283 Encounter for blood-alcohol and blood-drug test: Secondary | ICD-10-CM

## 2021-02-09 DIAGNOSIS — Z3401 Encounter for supervision of normal first pregnancy, first trimester: Secondary | ICD-10-CM

## 2021-02-09 DIAGNOSIS — Z113 Encounter for screening for infections with a predominantly sexual mode of transmission: Secondary | ICD-10-CM | POA: Diagnosis not present

## 2021-02-09 NOTE — Progress Notes (Signed)
      Sara Richardson presents for NOB nurse intake visit. Pregnancy confirmation done at [facility],  01/03/2021 with Philip Aspen  G1.  P0.  LMP 11/24/2020.  EDD 08/31/2021.  Ga [redacted]w[redacted]d. Pregnancy education material explained and given.  0 cats in the home; 2 outside cats.  NOB labs ordered. BMI less than 30. TSH/HbgA1c not ordered. Sickle cell not ordered due to race. HIV and drug screen explained and ordered. Genetic screening discussed. Genetic testing; Unsure. Pt to discuss genetic testing with provider. PNV encouraged. Pt to follow up with provider in _2 weeks for NOB physical and NOB LABS. [facility] Financial Policy and HIV/Drug Screening forms signed and completed by staff.

## 2021-02-09 NOTE — Patient Instructions (Signed)
WHAT OB PATIENTS CAN EXPECT   Confirmation of pregnancy and ultrasound ordered if medically indicated-[redacted] weeks gestation  New OB (NOB) intake with nurse and New OB (NOB) labs- [redacted] weeks gestation  New OB (NOB) physical examination with provider- 11/[redacted] weeks gestation  Flu vaccine-[redacted] weeks gestation  Anatomy scan-[redacted] weeks gestation  Glucose tolerance test, blood work to test for anemia, T-dap vaccine-[redacted] weeks gestation  Vaginal swabs/cultures-STD/Group B strep-[redacted] weeks gestation  Appointments every 4 weeks until 28 weeks  Every 2 weeks from 28 weeks until 36 weeks  Weekly visits from 36 weeks until delivery  [email or web address]  Prenatal Care Prenatal care is health care during pregnancy. It helps you and your unborn baby (fetus) stay as healthy as possible. Prenatal care may be provided by a midwife, a family practice doctor, a IT consultant (nurse practitioner or physician assistant), or a childbirth and pregnancy doctor (obstetrician). How does this affect me? During pregnancy, you will be closely monitored for any new conditions that might develop. To lower your risk of pregnancy complications, you and your health care provider will talk about any underlying conditions you have. How does this affect my baby? Early and consistent prenatal care increases the chance that your baby will be healthy during pregnancy. Prenatal care lowers the risk that your baby will be:  Born early (prematurely).  Smaller than expected at birth (small for gestational age). What can I expect at the first prenatal care visit? Your first prenatal care visit will likely be the longest. You should schedule your first prenatal care visit as soon as you know that you are pregnant. Your first visit is a good time to talk about any questions or concerns you have about pregnancy. Medical history At your visit, you and your health care provider will  talk about your medical history, including:  Any past pregnancies.  Your family's medical history.  Medical history of the baby's father.  Any long-term (chronic) health conditions you have and how you manage them.  Any surgeries or procedures you have had.  Any current over-the-counter or prescription medicines, herbs, or supplements that you are taking.  Other factors that could pose a risk to your baby, including: ? Exposure to harmful chemicals or radiation at work or at home. ? Any substance use, including tobacco, alcohol, and drug use.  Your home setting and your stress levels, including: ? Exposure to abuse or violence. ? Household financial strain.  Your daily health habits, including diet and exercise. Tests and screenings Your health care provider will:  Measure your weight, height, and blood pressure.  Do a physical exam, including a pelvic and breast exam.  Perform blood tests and urine tests to check for: ? Urinary tract infection. ? Sexually transmitted infections (STIs). ? Low iron levels in your blood (anemia). ? Blood type and certain proteins on red blood cells (Rh antibodies). ? Infections and immunity to viruses, such as hepatitis B and rubella. ? HIV (human immunodeficiency virus).  Discuss your options for genetic screening. Tips about staying healthy Your health care provider will also give you information about how to keep yourself and your baby healthy, including:  Nutrition and taking vitamins.  Physical activity.  How to manage pregnancy symptoms such as nausea and vomiting (morning sickness).  Infections and substances that may be harmful to your baby and how to avoid them.  Food safety.  Dental care.  Working.  Travel.  Warning signs to watch for and when to call your health care provider.  How often will I have prenatal care visits? After your first prenatal care visit, you will have regular visits throughout your pregnancy.  The visit schedule is often as follows:  Up to week 28 of pregnancy: once every 4 weeks.  28-36 weeks: once every 2 weeks.  After 36 weeks: every week until delivery. Some women may have visits more or less often depending on any underlying health conditions and the health of the baby. Keep all follow-up and prenatal care visits. This is important. What happens during routine prenatal care visits? Your health care provider will:  Measure your weight and blood pressure.  Check for fetal heart sounds.  Measure the height of your uterus in your abdomen (fundal height). This may be measured starting around week 20 of pregnancy.  Check the position of your baby inside your uterus.  Ask questions about your diet, sleeping patterns, and whether you can feel the baby move.  Review warning signs to watch for and signs of labor.  Ask about any pregnancy symptoms you are having and how you are dealing with them. Symptoms may include: ? Headaches. ? Nausea and vomiting. ? Vaginal discharge. ? Swelling. ? Fatigue. ? Constipation. ? Changes in your vision. ? Feeling persistently sad or anxious. ? Any discomfort, including back or pelvic pain. ? Bleeding or spotting. Make a list of questions to ask your health care provider at your routine visits.   What tests might I have during prenatal care visits? You may have blood, urine, and imaging tests throughout your pregnancy, such as:  Urine tests to check for glucose, protein, or signs of infection.  Glucose tests to check for a form of diabetes that can develop during pregnancy (gestational diabetes mellitus). This is usually done around week 24 of pregnancy.  Ultrasounds to check your baby's growth and development, to check for birth defects, and to check your baby's well-being. These can also help to decide when you should deliver your baby.  A test to check for group B strep (GBS) infection. This is usually done around week 36 of  pregnancy.  Genetic testing. This may include blood, fluid, or tissue sampling, or imaging tests, such as an ultrasound. Some genetic tests are done during the first trimester and some are done during the second trimester. What else can I expect during prenatal care visits? Your health care provider may recommend getting certain vaccines during pregnancy. These may include:  A yearly flu shot (annual influenza vaccine). This is especially important if you will be pregnant during flu season.  Tdap (tetanus, diphtheria, pertussis) vaccine. Getting this vaccine during pregnancy can protect your baby from whooping cough (pertussis) after birth. This vaccine may be recommended between weeks 27 and 36 of pregnancy.  A COVID-19 vaccine. Later in your pregnancy, your health care provider may give you information about:  Childbirth and breastfeeding classes.  Choosing a health care provider for your baby.  Umbilical cord banking.  Breastfeeding.  Birth control after your baby is born.  The hospital labor and delivery unit and how to set up a tour.  Registering at the hospital before you go into labor. Where to find more information  Office on Women's Health: LegalWarrants.gl  American Pregnancy Association: americanpregnancy.org  March of Dimes: marchofdimes.org Summary  Prenatal care helps you and your baby stay as healthy as possible during pregnancy.  Your first prenatal care visit will most likely be the longest.  You will have visits and tests throughout your pregnancy to monitor  your health and your baby's health.  Bring a list of questions to your visits to ask your health care provider.  Make sure to keep all follow-up and prenatal care visits. This information is not intended to replace advice given to you by your health care provider. Make sure you discuss any questions you have with your health care provider. Document Revised: 08/10/2020 Document Reviewed:  08/10/2020 Elsevier Patient Education  2021 Elsevier Inc. How a Baby Grows During Pregnancy Pregnancy begins when a female's sperm enters a female's egg. This is called fertilization. Fertilization usually happens in one of the fallopian tubes that connect the ovaries to the uterus. The fertilized egg moves down the fallopian tube to the uterus. Once it reaches the uterus, it implants into the lining of the uterus and begins to grow. For the first 8 weeks, the fertilized egg is called an embryo. After 8 weeks, it is called a fetus. As the fetus continues to grow, it receives oxygen and nutrients through the placenta, which is an organ that grows to support the developing baby. The placenta is the life support system for the baby. It provides oxygen and nutrition and removes waste. How long does a typical pregnancy last? A pregnancy usually lasts 280 days, or about 40 weeks. Pregnancy is divided into three periods of growth, also called trimesters:  First trimester: 0-12 weeks.  Second trimester: 13-27 weeks.  Third trimester: 28-40 weeks. The day when your baby is ready to be born (full term) is your estimated date of delivery. However, most babies are not born on their estimated date of delivery. How does my baby develop month by month? First month  The fertilized egg attaches to the inside of the uterus.  Some cells will form the placenta. Others will form the fetus.  The arms, legs, brain, spinal cord, lungs, and heart begin to develop.  At the end of the first month, the heart begins to beat. Second month  The bones, inner ear, eyelids, hands, and feet form.  The genitals develop.  By the end of 8 weeks, all major organs are developing. Third month  All of the internal organs are forming.  Teeth develop below the gums.  Bones and muscles begin to grow. The spine can flex.  The skin is transparent.  Fingernails and toenails begin to form.  Arms and legs continue to grow  longer, and hands and feet develop.  The fetus is about 3 inches (7.6 cm) long. Fourth month  The placenta is completely formed.  The external sex organs, neck, outer ear, eyebrows, eyelids, and fingernails are formed.  The fetus can hear, swallow, and move its arms and legs.  The kidneys begin to produce urine.  The skin is covered with a white, waxy coating (vernix) and very fine hair (lanugo). Fifth month  The fetus moves around more and can be felt for the first time (quickening).  The fetus starts to sleep and wake up and may begin to suck a finger.  The nails grow to the end of the fingers.  The organ in the digestive system that makes bile (gallbladder) functions and helps to digest nutrients.  If the fetus is a female, eggs are present in the ovaries. If the fetus is a female, testicles start to move down into the scrotum. Sixth month  The lungs are formed.  The eyes open. The brain continues to develop.  Your baby has fingerprints and toe prints. Your baby's hair grows thicker.  At  the end of the second trimester, the fetus is about 9 inches (22.9 cm) long. Seventh month  The fetus kicks and stretches.  The eyes are developed enough to sense changes in light.  The hands can make a grasping motion.  The fetus responds to sound. Eighth month  Most organs and body systems are fully developed and functioning.  Bones harden, and taste buds develop. The fetus may hiccup.  Certain areas of the brain are still developing. The skull remains soft. Ninth month  The fetus gains about  lb (0.23 kg) each week.  The lungs are fully developed.  Patterns of sleep develop.  The fetus's head typically moves into a head-down position (vertex) in the uterus to prepare for birth.  The fetus weighs 6-9 lb (2.72-4.08 kg) and is 19-20 inches (48.26-50.8 cm) long.   How do I know if my baby is developing well? Always talk with your health care provider about any concerns  that you may have about your pregnancy and your baby. At each prenatal visit, your health care provider will do several different tests to check on your health and keep track of your baby's development. These include:  Fundal height and position. To do this, your health care provider will: ? Measure your growing belly from your pubic bone to the top of the uterus using a tape measure. ? Feel your belly to determine your baby's position.  Heartbeat. An ultrasound in the first trimester can confirm pregnancy and show a heartbeat, depending on how far along you are. Your health care provider will check your baby's heart rate at every prenatal visit. You will also have a second trimester ultrasound to check your baby's development. Follow these instructions at home:  Take prenatal vitamins as told by your health care provider. These include vitamins such as folic acid, iron, calcium, and vitamin D. They are important for healthy development.  Take over-the-counter and prescription medicines only as told by your health care provider.  Keep all follow-up visits. This is important. Follow-up visits include prenatal care and screening tests. Summary  A pregnancy usually lasts 280 days, or about 40 weeks. Pregnancy is divided into three periods of growth, also called trimesters.  Your health care provider will monitor your baby's growth and development throughout your pregnancy.  Follow your health care provider's recommendations about taking prenatal vitamins and medicines during your pregnancy.  Talk with your health care provider if you have any concerns about your pregnancy or your developing baby. This information is not intended to replace advice given to you by your health care provider. Make sure you discuss any questions you have with your health care provider. Document Revised: 04/05/2020 Document Reviewed: 02/10/2020 Elsevier Patient Education  2021 [employer]. [email or web address]  First Trimester of Pregnancy  The first trimester of pregnancy starts on the first day of your last menstrual period until the end of week 12. This is also called months 1 through 3 of pregnancy. Body changes during your first trimester Your body goes through many changes during pregnancy. The changes usually return to normal after your baby is born. Physical changes  You may gain or lose weight.  Your breasts may grow larger and hurt. The area around your nipples may get darker.  Dark spots or blotches may develop on your face.  You may have changes in your hair. Health changes  You may feel like you might vomit (nauseous), and you may vomit.  You may have heartburn.  You  may have headaches.  You may have trouble pooping (constipation).  Your gums may bleed. Other changes  You may get tired easily.  You may pee (urinate) more often.  Your menstrual periods will stop.  You may not feel hungry.  You may want to eat certain kinds of food.  You may have changes in your emotions from day to day.  You may have more dreams. Follow these instructions at home: Medicines  Take over-the-counter and prescription medicines only as told by your doctor. Some medicines are not safe during pregnancy.  Take a prenatal vitamin that contains at least 600 micrograms (mcg) of folic acid. Eating and drinking  Eat healthy meals that include: ? Fresh fruits and vegetables. ? Whole grains. ? Good sources of protein, such as meat, eggs, or tofu. ? Low-fat dairy products.  Avoid raw meat and unpasteurized juice, milk, and cheese.  If you feel like you may vomit, or you vomit: ? Eat 4 or 5 small meals a day instead of 3 large meals. ? Try eating a few soda crackers. ? Drink liquids between meals instead of during meals.  You may need to take these actions to prevent or treat trouble pooping: ? Drink enough fluids to keep your  pee (urine) pale yellow. ? Eat foods that are high in fiber. These include beans, whole grains, and fresh fruits and vegetables. ? Limit foods that are high in fat and sugar. These include fried or sweet foods. Activity  Exercise only as told by your doctor. Most people can do their usual exercise routine during pregnancy.  Stop exercising if you have cramps or pain in your lower belly (abdomen) or low back.  Do not exercise if it is too hot or too humid, or if you are in a place of great height (high altitude).  Avoid heavy lifting.  If you choose to, you may have sex unless your doctor tells you not to. Relieving pain and discomfort  Wear a good support bra if your breasts are sore.  Rest with your legs raised (elevated) if you have leg cramps or low back pain.  If you have bulging veins (varicose veins) in your legs: ? Wear support hose as told by your doctor. ? Raise your feet for 15 minutes, 3-4 times a day. ? Limit salt in your food. Safety  Wear your seat belt at all times when you are in a car.  Talk with your doctor if someone is hurting you or yelling at you.  Talk with your doctor if you are feeling sad or have thoughts of hurting yourself. Lifestyle  Do not use hot tubs, steam rooms, or saunas.  Do not douche. Do not use tampons or scented sanitary pads.  Do not use herbal medicines, illegal drugs, or medicines that are not approved by your doctor. Do not drink alcohol.  Do not smoke or use any products that contain nicotine or tobacco. If you need help quitting, ask your doctor.  Avoid cat litter boxes and soil that is used by cats. These carry germs that can cause harm to the baby and can cause a loss of your baby by miscarriage or stillbirth. General instructions  Keep all follow-up visits. This is important.  Ask for help if you need counseling or if you need help with nutrition. Your doctor can give you advice or tell you where to go for help.  Visit  your dentist. At home, brush your teeth with a soft toothbrush. Floss gently.  Write down your questions. Take them to your prenatal visits. Where to find more information  American Pregnancy Association: americanpregnancy.org  [employer] of [employer]stetricians and Gynecologists: www.acog.org  Office on Women's Health: MightyReward.co.nz Contact a doctor if:  You are dizzy.  You have a fever.  You have mild cramps or pressure in your lower belly.  You have a nagging pain in your belly area.  You continue to feel like you may vomit, you vomit, or you have watery poop (diarrhea) for 24 hours or longer.  You have a bad-smelling fluid coming from your vagina.  You have pain when you pee.  You are exposed to a disease that spreads from person to person, such as chickenpox, measles, Zika virus, HIV, or hepatitis. Get help right away if:  You have spotting or bleeding from your vagina.  You have very bad belly cramping or pain.  You have shortness of breath or chest pain.  You have any kind of injury, such as from a fall or a car crash.  You have new or increased pain, swelling, or redness in an arm or leg. Summary  The first trimester of pregnancy starts on the first day of your last menstrual period until the end of week 12 (months 1 through 3).  Eat 4 or 5 small meals a day instead of 3 large meals.  Do not smoke or use any products that contain nicotine or tobacco. If you need help quitting, ask your doctor.  Keep all follow-up visits. This information is not intended to replace advice given to you by your health care provider. Make sure you discuss any questions you have with your health care provider. Document Revised: 04/05/2020 Document Reviewed: 02/10/2020 Elsevier Patient Education  2021 Elsevier Inc. Commonly Asked Questions During Pregnancy  Cats: A parasite can be excreted in cat feces.  To avoid exposure you need to have another person empty the  little box.  If you must empty the litter box you will need to wear gloves.  Wash your hands after handling your cat.  This parasite can also be found in raw or undercooked meat so this should also be avoided.  Colds, Sore Throats, Flu: Please check your medication sheet to see what you can take for symptoms.  If your symptoms are unrelieved by these medications please call the office.  Dental Work: Most any dental work Agricultural consultant recommends is permitted.  X-rays should only be taken during the first trimester if absolutely necessary.  Your abdomen should be shielded with a lead apron during all x-rays.  Please notify your provider prior to receiving any x-rays.  Novocaine is fine; gas is not recommended.  If your dentist requires a note from [place] prior to dental work please call the office and we will provide one for you.  Exercise: Exercise is an important part of staying healthy during your pregnancy.  You may continue most exercises you were accustomed to prior to pregnancy.  Later in your pregnancy you will most likely notice you have difficulty with activities requiring balance like riding a bicycle.  It is important that you listen to your body and avoid activities that put you at a higher risk of falling.  Adequate rest and staying well hydrated are a must!  If you have questions about the safety of specific activities ask your provider.    Exposure to Children with illness: Try to avoid obvious exposure; report any symptoms to [place] when noted,  If you have chicken  pos, red measles or mumps, you should be immune to these diseases.   Please do not take any vaccines while pregnant unless you have checked with your OB provider.  Fetal Movement: After 28 weeks we recommend you do "kick counts" twice daily.  Lie or sit down in a calm quiet environment and count your baby movements "kicks".  You should feel your baby at least 10 times per hour.  If you have not felt 10 kicks within the first hour get up,  walk around and have something sweet to eat or drink then repeat for an additional hour.  If count remains less than 10 per hour notify your provider.  Fumigating: Follow your pest control agent's advice as to how long to stay out of your home.  Ventilate the area well before re-entering.  Hemorrhoids:   Most over-the-counter preparations can be used during pregnancy.  Check your medication to see what is safe to use.  It is important to use a stool softener or fiber in your diet and to drink lots of liquids.  If hemorrhoids seem to be getting worse please call the office.   Hot Tubs:  Hot tubs Jacuzzis and saunas are not recommended while pregnant.  These increase your internal body temperature and should be avoided.  Intercourse:  Sexual intercourse is safe during pregnancy as long as you are comfortable, unless otherwise advised by your provider.  Spotting may occur after intercourse; report any bright red bleeding that is heavier than spotting.  Labor:  If you know that you are in labor, please go to the hospital.  If you are unsure, please call the office and let us help you decide what to do.  Lifting, straining, etc:  If your job requires heavy lifting or straining please check with your provider for any limitations.  Generally, you should not lift items heavier than that you can lift simply with your hands and arms (no back muscles)  Painting:  Paint fumes do not harm your pregnancy, but may make you ill and should be avoided if possible.  Latex or water based paints have less odor than oils.  Use adequate ventilation while painting.  Permanents & Hair Color:  Chemicals in hair dyes are not recommended as they cause increase hair dryness which can increase hair loss during pregnancy.  " Highlighting" and permanents are allowed.  Dye may be absorbed differently and permanents may not hold as well during pregnancy.  Sunbathing:  Use a sunscreen, as skin burns easily during pregnancy.  Drink  plenty of fluids; avoid over heating.  Tanning Beds:  Because their possible side effects are still unknown, tanning beds are not recommended.  Ultrasound Scans:  Routine ultrasounds are performed at approximately 20 weeks.  You will be able to see your baby's general anatomy an if you would like to know the gender this can usually be determined as well.  If it is questionable when you conceived you may also receive an ultrasound early in your pregnancy for dating purposes.  Otherwise ultrasound exams are not routinely performed unless there is a medical necessity.  Although you can request a scan we ask that you pay for it when conducted because insurance does not cover " patient request" scans.  Work: If your pregnancy proceeds without complications you may work until your due date, unless your physician or employer advises otherwise.  Round Ligament Pain/Pelvic Discomfort:  Sharp, shooting pains not associated with bleeding are fairly common, usually occurring in the  second trimester of pregnancy.  They tend to be worse when standing up or when you remain standing for long periods of time.  These are the result of pressure of certain pelvic ligaments called "round ligaments".  Rest, Tylenol and heat seem to be the most effective relief.  As the womb and fetus grow, they rise out of the pelvis and the discomfort improves.  Please notify the office if your pain seems different than that described.  It may represent a more serious condition.  Common Medications Safe in Pregnancy  Acne:      Constipation:  Benzoyl Peroxide     Colace  Clindamycin      Dulcolax Suppository  Topica Erythromycin     Fibercon  Salicylic Acid      Metamucil         Miralax AVOID:        Senakot   Accutane    Cough:  Retin-A       Cough Drops  Tetracycline      Phenergan w/ Codeine if Rx  Minocycline      Robitussin (Plain &  DM)  Antibiotics:     Crabs/Lice:  Ceclor       RID  Cephalosporins    AVOID:  E-Mycins      Kwell  Keflex  Macrobid/Macrodantin   Diarrhea:  Penicillin      Kao-Pectate  Zithromax      Imodium AD         PUSH FLUIDS AVOID:       Cipro     Fever:  Tetracycline      Tylenol (Regular or Extra  Minocycline       Strength)  Levaquin      Extra Strength-Do not          Exceed 8 tabs/24 hrs Caffeine:        '200mg'$ /day (equiv. To 1 cup of coffee or  approx. 3 12 oz sodas)         Gas: Cold/Hayfever:       Gas-X  Benadryl      Mylicon  Claritin       Phazyme  **Claritin-D        Chlor-Trimeton    Headaches:  Dimetapp      ASA-Free Excedrin  Drixoral-Non-Drowsy     Cold Compress  Mucinex (Guaifenasin)     Tylenol (Regular or Extra  Sudafed/Sudafed-12 Hour     Strength)  **Sudafed PE Pseudoephedrine   Tylenol Cold & Sinus     Vicks Vapor Rub  Zyrtec  **AVOID if Problems With Blood Pressure         Heartburn: Avoid lying down for at least 1 hour after meals  Aciphex      Maalox     Rash:  Milk of Magnesia     Benadryl    Mylanta       1% Hydrocortisone Cream  Pepcid  Pepcid Complete   Sleep Aids:  Prevacid      Ambien   Prilosec       Benadryl  Rolaids       Chamomile Tea  Tums (Limit 4/day)     Unisom         Tylenol PM         Warm milk-add vanilla or  Hemorrhoids:       Sugar for taste  Anusol/Anusol H.C.  (RX: Analapram 2.5%)  Sugar Substitutes:  Hydrocortisone OTC     Ok in moderation  Preparation H      Tucks        Vaseline lotion applied to tissue with wiping    Herpes:     Throat:  Acyclovir      Oragel  Famvir  Valtrex     Vaccines:         Flu Shot Leg Cramps:       *Gardasil  Benadryl      Hepatitis A         Hepatitis B Nasal Spray:       Pneumovax  Saline Nasal Spray     Polio Booster         Tetanus Nausea:       Tuberculosis test or PPD  Vitamin B6 25 mg TID   AVOID:    Dramamine      *Gardasil  Emetrol       Live Poliovirus  Ginger  Root 250 mg QID    MMR (measles, mumps &  High Complex Carbs @ Bedtime    rebella)  Sea Bands-Accupressure    Varicella (Chickenpox)  Unisom 1/2 tab TID     *No known complications           If received before Pain:         Known pregnancy;   Darvocet       Resume series after  Lortab        Delivery  Percocet    Yeast:   Tramadol      Femstat  Tylenol 3      Gyne-lotrimin  Ultram       Monistat  Vicodin           MISC:         All Sunscreens           Hair Coloring/highlights          Insect Repellant's          (Including DEET)         Mystic Tans

## 2021-02-20 ENCOUNTER — Encounter: Payer: Self-pay | Admitting: Certified Nurse Midwife

## 2021-02-20 ENCOUNTER — Other Ambulatory Visit: Payer: Self-pay

## 2021-02-20 ENCOUNTER — Ambulatory Visit (INDEPENDENT_AMBULATORY_CARE_PROVIDER_SITE_OTHER): Payer: BC Managed Care – PPO | Admitting: Certified Nurse Midwife

## 2021-02-20 VITALS — BP 119/80 | HR 96 | Wt 136.9 lb

## 2021-02-20 DIAGNOSIS — Z3401 Encounter for supervision of normal first pregnancy, first trimester: Secondary | ICD-10-CM

## 2021-02-20 DIAGNOSIS — Z3A12 12 weeks gestation of pregnancy: Secondary | ICD-10-CM

## 2021-02-20 LAB — POCT URINALYSIS DIPSTICK OB
Bilirubin, UA: NEGATIVE
Blood, UA: NEGATIVE
Glucose, UA: NEGATIVE
Ketones, UA: NEGATIVE
Leukocytes, UA: NEGATIVE
Nitrite, UA: NEGATIVE
POC,PROTEIN,UA: NEGATIVE
Spec Grav, UA: 1.02 (ref 1.010–1.025)
Urobilinogen, UA: 0.2 E.U./dL
pH, UA: 6.5 (ref 5.0–8.0)

## 2021-02-20 NOTE — Progress Notes (Signed)
NOB: Here for NOB physical. She will have NOB labs drawn today.

## 2021-02-20 NOTE — Patient Instructions (Signed)
https://www.[facility].org/womens-[email or web address]/faqs/[email or web address]-[email or web address]-tests">  Prenatal Care Prenatal care is health care during pregnancy. It helps you and your unborn baby (fetus) stay as healthy as possible. Prenatal care may be provided by a midwife, a family practice doctor, a IT consultant (nurse practitioner or physician assistant), or a childbirth and pregnancy doctor (obstetrician). How does this affect me? During pregnancy, you will be closely monitored for any new conditions that might develop. To lower your risk of pregnancy complications, you and your health care provider will talk about any underlying conditions you have. How does this affect my baby? Early and consistent prenatal care increases the chance that your baby will be healthy during pregnancy. Prenatal care lowers the risk that your baby will be:  Born early (prematurely).  Smaller than expected at birth (small for gestational age). What can I expect at the first prenatal care visit? Your first prenatal care visit will likely be the longest. You should schedule your first prenatal care visit as soon as you know that you are pregnant. Your first visit is a good time to talk about any questions or concerns you have about pregnancy. Medical history At your visit, you and your health care provider will talk about your medical history, including:  Any past pregnancies.  Your family's medical history.  Medical history of the baby's father.  Any long-term (chronic) health conditions you have and how you manage them.  Any surgeries or procedures you have had.  Any current over-the-counter or prescription medicines, herbs, or supplements that you are taking.  Other factors that could pose a risk to your baby, including: ? Exposure to harmful chemicals or radiation at work or at home. ? Any substance use, including tobacco, alcohol, and drug use.  Your home setting and your stress levels, including: ? Exposure to  abuse or violence. ? Household financial strain.  Your daily health habits, including diet and exercise. Tests and screenings Your health care provider will:  Measure your weight, height, and blood pressure.  Do a physical exam, including a pelvic and breast exam.  Perform blood tests and urine tests to check for: ? Urinary tract infection. ? Sexually transmitted infections (STIs). ? Low iron levels in your blood (anemia). ? Blood type and certain proteins on red blood cells (Rh antibodies). ? Infections and immunity to viruses, such as hepatitis B and rubella. ? HIV (human immunodeficiency virus).  Discuss your options for genetic screening. Tips about staying healthy Your health care provider will also give you information about how to keep yourself and your baby healthy, including:  Nutrition and taking vitamins.  Physical activity.  How to manage pregnancy symptoms such as nausea and vomiting (morning sickness).  Infections and substances that may be harmful to your baby and how to avoid them.  Food safety.  Dental care.  Working.  Travel.  Warning signs to watch for and when to call your health care provider. How often will I have prenatal care visits? After your first prenatal care visit, you will have regular visits throughout your pregnancy. The visit schedule is often as follows:  Up to week 28 of pregnancy: once every 4 weeks.  28-36 weeks: once every 2 weeks.  After 36 weeks: every week until delivery. Some women may have visits more or less often depending on any underlying health conditions and the health of the baby. Keep all follow-up and prenatal care visits. This is important. What happens during routine prenatal care visits? Your health care provider will:  Measure your weight  and blood pressure.  Check for fetal heart sounds.  Measure the height of your uterus in your abdomen (fundal height). This may be measured starting around week 20 of  pregnancy.  Check the position of your baby inside your uterus.  Ask questions about your diet, sleeping patterns, and whether you can feel the baby move.  Review warning signs to watch for and signs of labor.  Ask about any pregnancy symptoms you are having and how you are dealing with them. Symptoms may include: ? Headaches. ? Nausea and vomiting. ? Vaginal discharge. ? Swelling. ? Fatigue. ? Constipation. ? Changes in your vision. ? Feeling persistently sad or anxious. ? Any discomfort, including back or pelvic pain. ? Bleeding or spotting. Make a list of questions to ask your health care provider at your routine visits.   What tests might I have during prenatal care visits? You may have blood, urine, and imaging tests throughout your pregnancy, such as:  Urine tests to check for glucose, protein, or signs of infection.  Glucose tests to check for a form of diabetes that can develop during pregnancy (gestational diabetes mellitus). This is usually done around week 24 of pregnancy.  Ultrasounds to check your baby's growth and development, to check for birth defects, and to check your baby's well-being. These can also help to decide when you should deliver your baby.  A test to check for group B strep (GBS) infection. This is usually done around week 36 of pregnancy.  Genetic testing. This may include blood, fluid, or tissue sampling, or imaging tests, such as an ultrasound. Some genetic tests are done during the first trimester and some are done during the second trimester. What else can I expect during prenatal care visits? Your health care provider may recommend getting certain vaccines during pregnancy. These may include:  A yearly flu shot (annual influenza vaccine). This is especially important if you will be pregnant during flu season.  Tdap (tetanus, diphtheria, pertussis) vaccine. Getting this vaccine during pregnancy can protect your baby from whooping cough  (pertussis) after birth. This vaccine may be recommended between weeks 27 and 36 of pregnancy.  A COVID-19 vaccine. Later in your pregnancy, your health care provider may give you information about:  Childbirth and breastfeeding classes.  Choosing a health care provider for your baby.  Umbilical cord banking.  Breastfeeding.  Birth control after your baby is born.  The hospital labor and delivery unit and how to set up a tour.  Registering at the hospital before you go into labor. Where to find more information  Office on Women's Health: LegalWarrants.gl  American Pregnancy Association: americanpregnancy.org  March of Dimes: marchofdimes.org Summary  Prenatal care helps you and your baby stay as healthy as possible during pregnancy.  Your first prenatal care visit will most likely be the longest.  You will have visits and tests throughout your pregnancy to monitor your health and your baby's health.  Bring a list of questions to your visits to ask your health care provider.  Make sure to keep all follow-up and prenatal care visits. This information is not intended to replace advice given to you by your health care provider. Make sure you discuss any questions you have with your health care provider. Document Revised: 08/10/2020 Document Reviewed: 08/10/2020 Elsevier Patient Education  [place].

## 2021-02-20 NOTE — Addendum Note (Signed)
Addended by: Lorinda Creed on: 02/20/2021 02:46 PM   Modules accepted: Orders

## 2021-02-20 NOTE — Progress Notes (Signed)
NEW OB HISTORY AND PHYSICAL  SUBJECTIVE:       Sara Richardson is a 30 y.o. G37P0000 female, Patient's last menstrual period was 11/24/2020 (exact date)., Estimated Date of Delivery: 08/31/21, [redacted]w[redacted]d, presents today for establishment of Prenatal Care. She has no unusual complaints    Social Relationship: married Living with spouse Works: Pharmacist, hospital Exercise few times a week Denies smoking, drinking and drug use  Gynecologic History Patient's last menstrual period was 11/24/2020 (exact date). Normal Contraception: none Last Pap:07/15/19. Results were: normal  Obstetric History OB History  Gravida Para Term Preterm AB Living  1 0 0 0 0 0  SAB IAB Ectopic Multiple Live Births  0 0 0 0 0    # Outcome Date GA Lbr Len/2nd Weight Sex Delivery Anes PTL Lv  1 Current             Past Medical History:  Diagnosis Date  . Anxiety   . SVT (supraventricular tachycardia) (HCC)     Past Surgical History:  Procedure Laterality Date  . ABLATION    . NO PAST SURGERIES      Current Outpatient Medications on File Prior to Visit  Medication Sig Dispense Refill  . clonazePAM (KLONOPIN) 0.5 MG tablet Take 1 tablet (0.5 mg total) by mouth daily as needed for anxiety. 20 tablet 0  . Doxylamine-Pyridoxine 10-10 MG TBEC Take 10 mg by mouth daily. 9am,4pm,9:30 x2 doses 90 tablet 1  . prenatal vitamin w/FE, FA (NATACHEW) 29-1 MG CHEW chewable tablet Chew 1 tablet by mouth daily at 12 noon.     No current facility-administered medications on file prior to visit.    Allergies  Allergen Reactions  . Sulfamethoxazole-Trimethoprim Hives and Itching    Social History   Socioeconomic History  . Marital status: Married    Spouse name: Haze Boyden  . Number of children: Not on file  . Years of education: Not on file  . Highest education level: Not on file  Occupational History  . 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 Smoking status: Never Smoker  . Smokeless  tobacco: Never Used  Vaping Use  . Vaping Use: Never used  Substance and Sexual Activity  . Alcohol use: Not Currently  . Drug use: No  . Sexual activity: Yes    Partners: Male    Birth control/protection: None  Other Topics Concern  . Not on file  Social History Narrative  . Not on file   Social Determinants of Health   Financial Resource Strain: Not on file  Food Insecurity: Not on file  Transportation Needs: Not on file  Physical Activity: Not on file  Stress: Not on file  Social Connections: Not on file  Intimate Partner Violence: Not on file    Family History  Problem Relation Age of Onset  . Breast cancer Maternal Grandmother        lung cancer   . Prostate cancer Maternal Grandfather   . Hypertension Mother   . Heart Problems Father     The following portions of the patient's history were reviewed and updated as appropriate: allergies, current medications, past OB history, past medical history, past surgical history, past family history, past social history, and problem list.    OBJECTIVE: Initial Physical Exam (New OB)  GENERAL APPEARANCE: alert, well appearing, in no apparent distress, oriented to person, place and time HEAD: normocephalic, atraumatic MOUTH: mucous membranes moist, pharynx normal without lesions THYROID: no thyromegaly or  masses present BREASTS: no masses noted, no significant tenderness, no palpable axillary nodes, no skin changes LUNGS: clear to auscultation, no wheezes, rales or rhonchi, symmetric air entry HEART: regular rate and rhythm, no murmurs ABDOMEN: soft, nontender, nondistended, no abnormal masses, no epigastric pain and FHT present EXTREMITIES: no redness or tenderness in the calves or thighs SKIN: normal coloration and turgor, no rashes LYMPH NODES: no adenopathy palpable NEUROLOGIC: alert, oriented, normal speech, no focal findings or movement disorder noted  PELVIC EXAM EXTERNAL GENITALIA: normal appearing vulva with no  masses, tenderness or lesions VAGINA: no abnormal discharge or lesions CERVIX: no lesions or cervical motion tenderness UTERUS: gravid ADNEXA: no masses palpable and nontender OB EXAM PELVIMETRY: appears adequate RECTUM: exam not indicated  ASSESSMENT: Normal pregnancy  PLAN: Prenatal care See ordersNew OB counseling: The patient has been given an overview regarding routine prenatal care. Recommendations regarding diet, weight gain, and exercise in pregnancy were given. Prenatal testing, optional genetic testing,carrier screening testing,  and ultrasound use in pregnancy were reviewed. Benefits of Breast Feeding were discussed. The patient is encouraged to consider nursing her baby post partum.

## 2021-02-21 LAB — URINALYSIS, ROUTINE W REFLEX MICROSCOPIC
Bilirubin, UA: NEGATIVE
Glucose, UA: NEGATIVE
Ketones, UA: NEGATIVE
Nitrite, UA: NEGATIVE
Protein,UA: NEGATIVE
RBC, UA: NEGATIVE
Specific Gravity, UA: 1.007 (ref 1.005–1.030)
Urobilinogen, Ur: 0.2 mg/dL (ref 0.2–1.0)
pH, UA: 7.5 (ref 5.0–7.5)

## 2021-02-21 LAB — ABO AND RH: Rh Factor: POSITIVE

## 2021-02-21 LAB — GC/CHLAMYDIA PROBE AMP
Chlamydia trachomatis, NAA: NEGATIVE
Neisseria Gonorrhoeae by PCR: NEGATIVE

## 2021-02-21 LAB — HIV ANTIBODY (ROUTINE TESTING W REFLEX): HIV Screen 4th Generation wRfx: NONREACTIVE

## 2021-02-21 LAB — DRUG PROFILE, UR, 9 DRUGS (LABCORP)
Amphetamines, Urine: NEGATIVE ng/mL
Barbiturate Quant, Ur: NEGATIVE ng/mL
Benzodiazepine Quant, Ur: NEGATIVE ng/mL
Cannabinoid Quant, Ur: NEGATIVE ng/mL
Cocaine (Metab.): NEGATIVE ng/mL
Methadone Screen, Urine: NEGATIVE ng/mL
Opiate Quant, Ur: NEGATIVE ng/mL
PCP Quant, Ur: NEGATIVE ng/mL
Propoxyphene: NEGATIVE ng/mL

## 2021-02-21 LAB — NICOTINE SCREEN, URINE: Cotinine Ql Scrn, Ur: NEGATIVE ng/mL

## 2021-02-21 LAB — MICROSCOPIC EXAMINATION: Casts: NONE SEEN /lpf

## 2021-02-21 LAB — SPECIMEN STATUS REPORT

## 2021-02-21 LAB — VIRAL HEPATITIS HBV, HCV
HCV Ab: <0.1 s/co ratio (ref 0.0–0.9)
Hep B Core Total Ab: NEGATIVE
Hep B Surface Ab, Qual: NONREACTIVE
Hepatitis B Surface Ag: NEGATIVE

## 2021-02-21 LAB — TOXOPLASMA ANTIBODIES- IGG AND  IGM
Toxoplasma Antibody- IgM: <3 AU/mL (ref 0.0–7.9)
Toxoplasma IgG Ratio: <3 IU/mL (ref 0.0–7.1)

## 2021-02-21 LAB — VARICELLA ZOSTER ANTIBODY, IGG: Varicella zoster IgG: 1676 index (ref >165)

## 2021-02-21 LAB — HCV INTERPRETATION

## 2021-02-21 LAB — ANTIBODY SCREEN: Antibody Screen: NEGATIVE

## 2021-02-21 LAB — RUBELLA SCREEN: Rubella Antibodies, IGG: 2.5 index (ref >0.99)

## 2021-02-21 LAB — RPR: RPR Ser Ql: NONREACTIVE

## 2021-02-22 ENCOUNTER — Telehealth: Payer: Self-pay

## 2021-02-22 LAB — URINE CULTURE, OB REFLEX

## 2021-02-22 LAB — CULTURE, OB URINE

## 2021-02-22 NOTE — Telephone Encounter (Signed)
Call transferred from front desk-   Pt questioned why she had to pay for her b/w at her last visit. Advised pt that was between her insurance and [place] pt aware that [facility] will do an estimate after insurance and asked the pt for a payment at the time of the draw. Gave pt the number to [facility] for her to reach out.   Pt also asked about when we will be getting a u/s  Tech. She states that having her u/s at [facility] are more costly for her. I Aplogized to the patient and let her know we are actively looking for a tech. Pt states she understands it is not our fault but is very frustrating for her. Informed pt that I understand and we are doing our best to get a tech hired. Pt voiced understanding.

## 2021-03-20 ENCOUNTER — Other Ambulatory Visit: Payer: Self-pay | Admitting: Surgical

## 2021-03-20 MED ORDER — DOXYLAMINE-PYRIDOXINE 10-10 MG PO TBEC: 10.0000 mg | DELAYED_RELEASE_TABLET | Freq: Every day | ORAL | 1 refills | Status: DC

## 2021-03-23 ENCOUNTER — Other Ambulatory Visit: Payer: Self-pay

## 2021-03-23 ENCOUNTER — Ambulatory Visit: Payer: BC Managed Care – PPO | Admitting: Certified Nurse Midwife

## 2021-03-23 VITALS — BP 124/79 | HR 93 | Wt 141.5 lb

## 2021-03-23 DIAGNOSIS — Z3689 Encounter for other specified antenatal screening: Secondary | ICD-10-CM

## 2021-03-23 DIAGNOSIS — Z3A17 17 weeks gestation of pregnancy: Secondary | ICD-10-CM

## 2021-03-23 DIAGNOSIS — Z3402 Encounter for supervision of normal first pregnancy, second trimester: Secondary | ICD-10-CM

## 2021-03-23 DIAGNOSIS — N9489 Other specified conditions associated with female genital organs and menstrual cycle: Secondary | ICD-10-CM

## 2021-03-23 LAB — POCT URINALYSIS DIPSTICK OB
Bilirubin, UA: NEGATIVE
Blood, UA: NEGATIVE
Glucose, UA: NEGATIVE
Ketones, UA: NEGATIVE
Leukocytes, UA: NEGATIVE
Nitrite, UA: NEGATIVE
POC,PROTEIN,UA: NEGATIVE
Spec Grav, UA: 1.01 (ref 1.010–1.025)
Urobilinogen, UA: 0.2 E.U./dL
pH, UA: 7.5 (ref 5.0–8.0)

## 2021-03-23 NOTE — Progress Notes (Signed)
ROB: she has been having some mild abdominal cramping and pain when she urinates.

## 2021-03-23 NOTE — Progress Notes (Signed)
ROB-Reports mild abdominal cramping when urinating. Discussed home treatment measures including use of abdominal support. MFM referral placed for anatomy scan, see chart. MRI ordered due possible left adnexal cyst, see chart. Anticipatory guidance regarding course of prenatal care. Reviewed red flag symptoms and when to call. RTC x 3 weeks for ROB with ANNIE or sooner if needed.

## 2021-03-23 NOTE — Patient Instructions (Signed)
Round Ligament Pain  The round ligament is a cord of muscle and tissue that helps support the uterus. It can become a source of pain during pregnancy if it becomes stretched or twisted as the baby grows. The pain usually begins in the second trimester (13-28 weeks) of pregnancy, and it can come and go until the baby is delivered. It is not a serious problem, and it does not cause harm to the baby. Round ligament pain is usually a short, sharp, and pinching pain, but it can also be a dull, lingering, and aching pain. The pain is felt in the lower side of the abdomen or in the groin. It usually starts deep in the groin and moves up to the outside of the hip area. The pain may occur when you:  Suddenly change position, such as quickly going from a sitting to standing position.  Roll over in bed.  Cough or sneeze.  Do physical activity. Follow these instructions at home:  Watch your condition for any changes.  When the pain starts, relax. Then try any of these methods to help with the pain: ? Sitting down. ? Flexing your knees up to your abdomen. ? Lying on your side with one pillow under your abdomen and another pillow between your legs. ? Sitting in a warm bath for 15-20 minutes or until the pain goes away.  Take over-the-counter and prescription medicines only as told by your health care provider.  Move slowly when you sit down or stand up.  Avoid long walks if they cause pain.  Stop or reduce your physical activities if they cause pain.  Keep all follow-up visits as told by your health care provider. This is important.   Contact a health care provider if:  Your pain does not go away with treatment.  You feel pain in your back that you did not have before.  Your medicine is not helping. Get help right away if:  You have a fever or chills.  You develop uterine contractions.  You have vaginal bleeding.  You have nausea or vomiting.  You have diarrhea.  You have pain  when you urinate. Summary  Round ligament pain is felt in the lower abdomen or groin. It is usually a short, sharp, and pinching pain. It can also be a dull, lingering, and aching pain.  This pain usually begins in the second trimester (13-28 weeks). It occurs because the uterus is stretching with the growing baby, and it is not harmful to the baby.  You may notice the pain when you suddenly change position, when you cough or sneeze, or during physical activity.  Relaxing, flexing your knees to your abdomen, lying on one side, or taking a warm bath may help to get rid of the pain.  Get help from your health care provider if the pain does not go away or if you have vaginal bleeding, nausea, vomiting, diarrhea, or painful urination. This information is not intended to replace advice given to you by your health care provider. Make sure you discuss any questions you have with your health care provider. Document Revised: 04/15/2018 Document Reviewed: 04/15/2018 Elsevier Patient Education  [place].   Common Medications Safe in Pregnancy  Acne:      Constipation:  Benzoyl Peroxide     Colace  Clindamycin      Dulcolax Suppository  Topica Erythromycin     Fibercon  Salicylic Acid      Metamucil  Miralax AVOID:        Senakot   Accutane    Cough:  Retin-A       Cough Drops  Tetracycline      Phenergan w/ Codeine if Rx  Minocycline      Robitussin (Plain & DM)  Antibiotics:     Crabs/Lice:  Ceclor       RID  Cephalosporins    AVOID:  E-Mycins      Kwell  Keflex  Macrobid/Macrodantin   Diarrhea:  Penicillin      Kao-Pectate  Zithromax      Imodium AD         PUSH FLUIDS AVOID:       Cipro     Fever:  Tetracycline      Tylenol (Regular or Extra  Minocycline       Strength)  Levaquin      Extra Strength-Do not          Exceed 8 tabs/24 hrs Caffeine:        <259m/day (equiv. To 1 cup of coffee or  approx. 3 12 oz  sodas)         Gas: Cold/Hayfever:       Gas-X  Benadryl      Mylicon  Claritin       Phazyme  **Claritin-D        Chlor-Trimeton    Headaches:  Dimetapp      ASA-Free Excedrin  Drixoral-Non-Drowsy     Cold Compress  Mucinex (Guaifenasin)     Tylenol (Regular or Extra  Sudafed/Sudafed-12 Hour     Strength)  **Sudafed PE Pseudoephedrine   Tylenol Cold & Sinus     Vicks Vapor Rub  Zyrtec  **AVOID if Problems With Blood Pressure         Heartburn: Avoid lying down for at least 1 hour after meals  Aciphex      Maalox     Rash:  Milk of Magnesia     Benadryl    Mylanta       1% Hydrocortisone Cream  Pepcid  Pepcid Complete   Sleep Aids:  Prevacid      Ambien   Prilosec       Benadryl  Rolaids       Chamomile Tea  Tums (Limit 4/day)     Unisom         Tylenol PM         Warm milk-add vanilla or  Hemorrhoids:       Sugar for taste  Anusol/Anusol H.C.  (RX: Analapram 2.5%)  Sugar Substitutes:  Hydrocortisone OTC     Ok in moderation  Preparation H      Tucks        Vaseline lotion applied to tissue with wiping    Herpes:     Throat:  Acyclovir      Oragel  Famvir  Valtrex     Vaccines:         Flu Shot Leg Cramps:       *Gardasil  Benadryl      Hepatitis A         Hepatitis B Nasal Spray:       Pneumovax  Saline Nasal Spray     Polio Booster         Tetanus Nausea:       Tuberculosis test or PPD  Vitamin B6 25 mg TID   AVOID:    Dramamine      *  Gardasil  Emetrol       Live Poliovirus  Ginger Root 250 mg QID    MMR (measles, mumps &  High Complex Carbs @ Bedtime    rebella)  Sea Bands-Accupressure    Varicella (Chickenpox)  Unisom 1/2 tab TID     *No known complications           If received before Pain:         Known pregnancy;   Darvocet       Resume series after  Lortab        Delivery  Percocet    Yeast:   Tramadol      Femstat  Tylenol 3      Gyne-lotrimin  Ultram       Monistat  Vicodin           MISC:         All Sunscreens           Hair  Coloring/highlights          Insect Repellant's          (Including DEET)         Mystic Tans   Second Trimester of Pregnancy  The second trimester of pregnancy is from week 13 through week 27. This is also called months 4 through 6 of pregnancy. This is often the time when you feel your best. During the second trimester:  Morning sickness is less or has stopped.  You may have more energy.  You may feel hungry more often. At this time, your unborn baby (fetus) is growing very fast. At the end of the sixth month, the unborn baby may be up to 12 inches long and weigh about 1 pounds. You will likely start to feel the baby move between 16 and 20 weeks of pregnancy. Body changes during your second trimester Your body continues to go through many changes during this time. The changes vary and generally return to normal after the baby is born. Physical changes  You will gain more weight.  You may start to get stretch marks on your hips, belly (abdomen), and breasts.  Your breasts will grow and may hurt.  Dark spots or blotches may develop on your face.  A dark line from your belly button to the pubic area (linea nigra) may appear.  You may have changes in your hair. Health changes  You may have headaches.  You may have heartburn.  You may have trouble pooping (constipation).  You may have hemorrhoids or swollen, bulging veins (varicose veins).  Your gums may bleed.  You may pee (urinate) more often.  You may have back pain. Follow these instructions at home: Medicines  Take over-the-counter and prescription medicines only as told by your doctor. Some medicines are not safe during pregnancy.  Take a prenatal vitamin that contains at least 600 micrograms (mcg) of folic acid. Eating and drinking  Eat healthy meals that include: ? Fresh fruits and vegetables. ? Whole grains. ? Good sources of protein, such as meat, eggs, or tofu. ? Low-fat dairy products.  Avoid raw  meat and unpasteurized juice, milk, and cheese.  You may need to take these actions to prevent or treat trouble pooping: ? Drink enough fluids to keep your pee (urine) pale yellow. ? Eat foods that are high in fiber. These include beans, whole grains, and fresh fruits and vegetables. ? Limit foods that are high in fat and sugar. These include fried or sweet foods. Activity  Exercise only as told by your doctor. Most people can do their usual exercise during pregnancy. Try to exercise for 30 minutes at least 5 days a week.  Stop exercising if you have pain or cramps in your belly or lower back.  Do not exercise if it is too hot or too humid, or if you are in a place of great height (high altitude).  Avoid heavy lifting.  If you choose to, you may have sex unless your doctor tells you not to. Relieving pain and discomfort  Wear a good support bra if your breasts are sore.  Take warm water baths (sitz baths) to soothe pain or discomfort caused by hemorrhoids. Use hemorrhoid cream if your doctor approves.  Rest with your legs raised (elevated) if you have leg cramps or low back pain.  If you develop bulging veins in your legs: ? Wear support hose as told by your doctor. ? Raise your feet for 15 minutes, 3-4 times a day. ? Limit salt in your food. Safety  Wear your seat belt at all times when you are in a car.  Talk with your doctor if someone is hurting you or yelling at you a lot. Lifestyle  Do not use hot tubs, steam rooms, or saunas.  Do not douche. Do not use tampons or scented sanitary pads.  Avoid cat litter boxes and soil used by cats. These carry germs that can harm your baby and can cause a loss of your baby by miscarriage or stillbirth.  Do not use herbal medicines, illegal drugs, or medicines that are not approved by your doctor. Do not drink alcohol.  Do not smoke or use any products that contain nicotine or tobacco. If you need help quitting, ask your  doctor. General instructions  Keep all follow-up visits. This is important.  Ask your doctor about local prenatal classes.  Ask your doctor about the right foods to eat or for help finding a counselor. Where to find more information  American Pregnancy Association: americanpregnancy.org  [employer]: www.acog.org  Office on [employer]: KeywordPortfolios.com.br Contact a doctor if:  You have a headache that does not go away when you take medicine.  You have changes in how you see, or you see spots in front of your eyes.  You have mild cramps, pressure, or pain in your lower belly.  You continue to feel like you may vomit (nauseous), you vomit, or you have watery poop (diarrhea).  You have bad-smelling fluid coming from your vagina.  You have pain when you pee or your pee smells bad.  You have very bad swelling of your face, hands, ankles, feet, or legs.  You have a fever. Get help right away if:  You are leaking fluid from your vagina.  You have spotting or bleeding from your vagina.  You have very bad belly cramping or pain.  You have trouble breathing.  You have chest pain.  You faint.  You have not felt your baby move for the time period told by your doctor.  You have new or increased pain, swelling, or redness in an arm or leg. Summary  The second trimester of pregnancy is from week 13 through week 27 (months 4 through 6).  Eat healthy meals.  Exercise as told by your doctor. Most people can do their usual exercise during pregnancy.  Do not use herbal medicines, illegal drugs, or medicines that are not approved by your doctor. Do not drink alcohol.  Call your doctor if you get sick or if you notice anything unusual about your pregnancy. This information is not intended to replace advice given to you by your health care provider. Make sure you discuss any questions you have with your health care  provider. Document Revised: 04/05/2020 Document Reviewed: 02/10/2020 Elsevier Patient Education  2021 [employer].

## 2021-03-27 ENCOUNTER — Other Ambulatory Visit: Payer: Self-pay

## 2021-03-27 DIAGNOSIS — N9489 Other specified conditions associated with female genital organs and menstrual cycle: Secondary | ICD-10-CM

## 2021-03-28 ENCOUNTER — Other Ambulatory Visit: Payer: Self-pay

## 2021-03-28 DIAGNOSIS — N9489 Other specified conditions associated with female genital organs and menstrual cycle: Secondary | ICD-10-CM

## 2021-03-28 DIAGNOSIS — Z3A17 17 weeks gestation of pregnancy: Secondary | ICD-10-CM

## 2021-04-10 ENCOUNTER — Ambulatory Visit: Payer: BC Managed Care – PPO

## 2021-04-17 ENCOUNTER — Other Ambulatory Visit: Payer: Self-pay

## 2021-04-17 ENCOUNTER — Ambulatory Visit: Payer: BC Managed Care – PPO | Attending: Obstetrics and Gynecology

## 2021-04-17 DIAGNOSIS — O3482 Maternal care for other abnormalities of pelvic organs, second trimester: Secondary | ICD-10-CM | POA: Diagnosis not present

## 2021-04-17 DIAGNOSIS — Z3A20 20 weeks gestation of pregnancy: Secondary | ICD-10-CM | POA: Diagnosis not present

## 2021-04-17 DIAGNOSIS — Z3402 Encounter for supervision of normal first pregnancy, second trimester: Secondary | ICD-10-CM

## 2021-04-17 DIAGNOSIS — N83209 Unspecified ovarian cyst, unspecified side: Secondary | ICD-10-CM | POA: Diagnosis not present

## 2021-04-17 DIAGNOSIS — Z363 Encounter for antenatal screening for malformations: Secondary | ICD-10-CM | POA: Diagnosis present

## 2021-04-17 DIAGNOSIS — Z3A17 17 weeks gestation of pregnancy: Secondary | ICD-10-CM

## 2021-04-17 DIAGNOSIS — O3480 Maternal care for other abnormalities of pelvic organs, unspecified trimester: Secondary | ICD-10-CM | POA: Diagnosis not present

## 2021-04-17 DIAGNOSIS — N9489 Other specified conditions associated with female genital organs and menstrual cycle: Secondary | ICD-10-CM

## 2021-04-19 ENCOUNTER — Ambulatory Visit: Payer: BC Managed Care – PPO

## 2021-04-23 ENCOUNTER — Encounter: Payer: BC Managed Care – PPO | Admitting: Certified Nurse Midwife

## 2021-04-24 ENCOUNTER — Other Ambulatory Visit: Payer: Self-pay

## 2021-04-24 ENCOUNTER — Encounter: Payer: Self-pay | Admitting: Obstetrics and Gynecology

## 2021-04-24 ENCOUNTER — Ambulatory Visit (INDEPENDENT_AMBULATORY_CARE_PROVIDER_SITE_OTHER): Payer: BC Managed Care – PPO | Admitting: Obstetrics and Gynecology

## 2021-04-24 VITALS — BP 126/83 | HR 83 | Wt 153.6 lb

## 2021-04-24 DIAGNOSIS — Z3402 Encounter for supervision of normal first pregnancy, second trimester: Secondary | ICD-10-CM

## 2021-04-24 DIAGNOSIS — Z3A21 21 weeks gestation of pregnancy: Secondary | ICD-10-CM

## 2021-04-24 DIAGNOSIS — R1900 Intra-abdominal and pelvic swelling, mass and lump, unspecified site: Secondary | ICD-10-CM

## 2021-04-24 DIAGNOSIS — O26899 Other specified pregnancy related conditions, unspecified trimester: Secondary | ICD-10-CM

## 2021-04-24 LAB — POCT URINALYSIS DIPSTICK OB
Bilirubin, UA: NEGATIVE
Blood, UA: NEGATIVE
Glucose, UA: NEGATIVE
Ketones, UA: NEGATIVE
Leukocytes, UA: NEGATIVE
Nitrite, UA: NEGATIVE
POC,PROTEIN,UA: NEGATIVE
Spec Grav, UA: 1.01 (ref 1.010–1.025)
Urobilinogen, UA: 0.2 U/dL
pH, UA: 6 (ref 5.0–8.0)

## 2021-04-24 NOTE — Patient Instructions (Signed)

## 2021-04-24 NOTE — Progress Notes (Signed)
OB pt present to est care with a new provider. Pt stated that since JML was leaving she would like to see who else was in the office.

## 2021-04-24 NOTE — Progress Notes (Signed)
ROB: Patient presents from midwifery service for consultation. 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 She has been seen by MFM twice now, and notes that one recommended MRI follow up, however subsequent visit with a different provider it was noted that the MRI was not necessary. Patient notes confusion on how she should proceed. Discussed risks and benefits of performing MRI. After discussion, patient will likely proceed with MRI as scheduled (05/21/21). Otherwise normal anatomy scan noted.  Patient also desires to meet other providers in the practice. Advised that she could return and meet Dr. Amalia Hailey next visit. Advised that after visit, she can decide if she desires to remain in midwifery care or transition completely to MD side. RTC in 4 weeks.

## 2021-05-02 ENCOUNTER — Other Ambulatory Visit: Payer: Self-pay

## 2021-05-02 MED ORDER — MEDELA DOUBLE BREAST PUMP MISC: 1.0000 | Freq: Every day | 0 refills | Status: DC

## 2021-05-03 ENCOUNTER — Telehealth: Payer: Self-pay | Admitting: Obstetrics and Gynecology

## 2021-05-03 ENCOUNTER — Other Ambulatory Visit: Payer: Self-pay

## 2021-05-03 NOTE — Telephone Encounter (Signed)
Pt advise on how to send in a rx without a pharmacy name.  Thanks [employer]

## 2021-05-03 NOTE — Telephone Encounter (Signed)
Error

## 2021-05-03 NOTE — Telephone Encounter (Signed)
Patient called and stated that pharmacy did not receive RX for breast pump.  She stated that it needs to be faxed to [facility] fax # is [phone].  Code needed on RX is Z39.1 double breast pump

## 2021-05-03 NOTE — Telephone Encounter (Signed)
error 

## 2021-05-04 ENCOUNTER — Other Ambulatory Visit: Payer: Self-pay

## 2021-05-04 MED ORDER — MEDELA DOUBLE BREAST PUMP MISC: 1.0000 | Freq: Every day | 0 refills | Status: DC

## 2021-05-04 NOTE — Telephone Encounter (Signed)
[facility] with [facility] that [facility] is [facility] Not in our system.   Printed rx and faxed it with confirmation. Pt to follow up with [facility] to confirm they have the rx.   Pt aware to call me if any questions/concerns.

## 2021-05-08 ENCOUNTER — Other Ambulatory Visit: Payer: Self-pay | Admitting: Certified Nurse Midwife

## 2021-05-16 ENCOUNTER — Other Ambulatory Visit: Payer: BC Managed Care – PPO

## 2021-05-21 ENCOUNTER — Other Ambulatory Visit: Payer: Self-pay

## 2021-05-21 ENCOUNTER — Ambulatory Visit
Admission: RE | Admit: 2021-05-21 | Discharge: 2021-05-21 | Disposition: A | Discharge status: Home / Self Care | Payer: BC Managed Care – PPO | Source: Ambulatory Visit | Attending: Certified Nurse Midwife | Admitting: Certified Nurse Midwife

## 2021-05-21 DIAGNOSIS — Z3A17 17 weeks gestation of pregnancy: Secondary | ICD-10-CM | POA: Diagnosis present

## 2021-05-21 DIAGNOSIS — N9489 Other specified conditions associated with female genital organs and menstrual cycle: Secondary | ICD-10-CM | POA: Diagnosis not present

## 2021-05-21 NOTE — Progress Notes (Signed)
Please see scan. Thanks, JML

## 2021-05-24 ENCOUNTER — Encounter: Payer: Self-pay | Admitting: Obstetrics and Gynecology

## 2021-05-24 ENCOUNTER — Ambulatory Visit (INDEPENDENT_AMBULATORY_CARE_PROVIDER_SITE_OTHER): Payer: BC Managed Care – PPO | Admitting: Obstetrics and Gynecology

## 2021-05-24 ENCOUNTER — Other Ambulatory Visit: Payer: Self-pay

## 2021-05-24 VITALS — BP 120/74 | HR 88 | Wt 161.9 lb

## 2021-05-24 DIAGNOSIS — Z3A25 25 weeks gestation of pregnancy: Secondary | ICD-10-CM

## 2021-05-24 DIAGNOSIS — Z3402 Encounter for supervision of normal first pregnancy, second trimester: Secondary | ICD-10-CM

## 2021-05-24 LAB — POCT URINALYSIS DIPSTICK OB
Bilirubin, UA: NEGATIVE
Blood, UA: NEGATIVE
Glucose, UA: NEGATIVE
Ketones, UA: NEGATIVE
Leukocytes, UA: NEGATIVE
Nitrite, UA: NEGATIVE
POC,PROTEIN,UA: NEGATIVE
Spec Grav, UA: 1.01 (ref 1.010–1.025)
Urobilinogen, UA: 0.2 E.U./dL
pH, UA: 7 (ref 5.0–8.0)

## 2021-05-24 NOTE — Progress Notes (Signed)
Please inform patient, has transitioned to MD care. Thanks, JML

## 2021-05-24 NOTE — Patient Instructions (Signed)
Second Trimester of Pregnancy  The second trimester of pregnancy is from week 13 through week 27. This is also called months 4 through 6 of pregnancy. This is often the time when you feelyour best. During the second trimester: Morning sickness is less or has stopped. You may have more energy. You may feel hungry more often. At this time, your unborn baby (fetus) is growing very fast. At the end of the sixth month, the unborn baby may be up to 12 inches long and weigh about 1 pounds. You will likely start to feelthe baby move between 16 and 20 weeks of pregnancy. Body changes during your second trimester Your body continues to go through many changes during this time. The changesvary and generally return to normal after the baby is born. Physical changes You will gain more weight. You may start to get stretch marks on your hips, belly (abdomen), and breasts. Your breasts will grow and may hurt. Dark spots or blotches may develop on your face. A dark line from your belly button to the pubic area (linea nigra) may appear. You may have changes in your hair. Health changes You may have headaches. You may have heartburn. You may have trouble pooping (constipation). You may have hemorrhoids or swollen, bulging veins (varicose veins). Your gums may bleed. You may pee (urinate) more often. You may have back pain. Follow these instructions at home: Medicines Take over-the-counter and prescription medicines only as told by your doctor. Some medicines are not safe during pregnancy. Take a prenatal vitamin that contains at least 600 micrograms (mcg) of folic acid. Eating and drinking Eat healthy meals that include: Fresh fruits and vegetables. Whole grains. Good sources of protein, such as meat, eggs, or tofu. Low-fat dairy products. Avoid raw meat and unpasteurized juice, milk, and cheese. You may need to take these actions to prevent or treat trouble pooping: Drink enough fluids to  keep your pee (urine) pale yellow. Eat foods that are high in fiber. These include beans, whole grains, and fresh fruits and vegetables. Limit foods that are high in fat and sugar. These include fried or sweet foods. Activity Exercise only as told by your doctor. Most people can do their usual exercise during pregnancy. Try to exercise for 30 minutes at least 5 days a week. Stop exercising if you have pain or cramps in your belly or lower back. Do not exercise if it is too hot or too humid, or if you are in a place of great height (high altitude). Avoid heavy lifting. If you choose to, you may have sex unless your doctor tells you not to. Relieving pain and discomfort Wear a good support bra if your breasts are sore. Take warm water baths (sitz baths) to soothe pain or discomfort caused by hemorrhoids. Use hemorrhoid cream if your doctor approves. Rest with your legs raised (elevated) if you have leg cramps or low back pain. If you develop bulging veins in your legs: Wear support hose as told by your doctor. Raise your feet for 15 minutes, 3-4 times a day. Limit salt in your food. Safety Wear your seat belt at all times when you are in a car. Talk with your doctor if someone is hurting you or yelling at you a lot. Lifestyle Do not use hot tubs, steam rooms, or saunas. Do not douche. Do not use tampons or scented sanitary pads. Avoid cat litter boxes and soil used by cats. These carry germs that can harm your baby and can  cause a loss of your baby by miscarriage or stillbirth. Do not use herbal medicines, illegal drugs, or medicines that are not approved by your doctor. Do not drink alcohol. Do not smoke or use any products that contain nicotine or tobacco. If you need help quitting, ask your doctor. General instructions Keep all follow-up visits. This is important. Ask your doctor about local prenatal classes. Ask your doctor about the right foods to eat or for help finding a  counselor. Where to find more information American Pregnancy Association: americanpregnancy.org [employer]: www.acog.org Office on [employer]: KeywordPortfolios.com.br Contact a doctor if: You have a headache that does not go away when you take medicine. You have changes in how you see, or you see spots in front of your eyes. You have mild cramps, pressure, or pain in your lower belly. You continue to feel like you may vomit (nauseous), you vomit, or you have watery poop (diarrhea). You have bad-smelling fluid coming from your vagina. You have pain when you pee or your pee smells bad. You have very bad swelling of your face, hands, ankles, feet, or legs. You have a fever. Get help right away if: You are leaking fluid from your vagina. You have spotting or bleeding from your vagina. You have very bad belly cramping or pain. You have trouble breathing. You have chest pain. You faint. You have not felt your baby move for the time period told by your doctor. You have new or increased pain, swelling, or redness in an arm or leg. Summary The second trimester of pregnancy is from week 13 through week 27 (months 4 through 6). Eat healthy meals. Exercise as told by your doctor. Most people can do their usual exercise during pregnancy. Do not use herbal medicines, illegal drugs, or medicines that are not approved by your doctor. Do not drink alcohol. Call your doctor if you get sick or if you notice anything unusual about your pregnancy. This information is not intended to replace advice given to you by your health care provider. Make sure you discuss any questions you have with your healthcare provider. Document Revised: 04/05/2020 Document Reviewed: 02/10/2020 Elsevier Patient Education  [place].    Rosen's Emergency Medicine: Concepts and Clinical Practice (9th ed., pp. 2296- 2312). Elsevier.">  Braxton Hicks  Contractions  Contractions of the uterus can occur throughout pregnancy, but they are not always a sign that you are in labor. You may have practice contractions called Braxton Hicks contractions. These false labor contractions are sometimesconfused with true labor. What are [place] contractions? Braxton Hicks contractions are tightening movements that occur in the muscles of the uterus before labor. Unlike true labor contractions, these contractions do not result in opening (dilation) and thinning of the lowest part of the uterus (cervix). Toward the end of pregnancy (32-34 weeks), Braxton Hicks contractions can happen more often and may become stronger. These contractions are sometimesdifficult to tell apart from true labor because they can be very uncomfortable. How to tell the difference between true labor and false labor True labor Contractions last 30-70 seconds. Contractions become very regular. Discomfort is usually felt in the top of the uterus, and it spreads to the lower abdomen and low back. Contractions do not go away with walking. Contractions usually become stronger and more frequent. The cervix dilates and gets thinner. False labor Contractions are usually shorter, weaker, and farther apart than true labor contractions. Contractions are usually irregular. Contractions are often felt in  the front of the lower abdomen and in the groin. Contractions may go away when you walk around or change positions while lying down. The cervix usually does not dilate or become thin. Sometimes, the only way to tell if you are in true labor is for your healthcare provider to look for changes in your cervix. Your health care provider will do a physical exam and may monitor your contractions. If you are in true labor, your health care provider will send youhome with instructions about when to return to the hospital. You may continue to have Braxton Hicks contractions until you go into  truelabor. Follow these instructions at home:  Take over-the-counter and prescription medicines only as told by your health care provider. If Braxton Hicks contractions are making you uncomfortable: Change your position from lying down or resting to walking, or change from walking to resting. Sit and rest in a tub of warm water. Drink enough fluid to keep your urine pale yellow. Dehydration may cause these contractions. Do slow and deep breathing several times an hour. Keep all follow-up visits. This is important. Contact a health care provider if: You have a fever. You have continuous pain in your abdomen. Your contractions become stronger, more regular, and closer together. You pass blood-tinged mucus. Get help right away if: You have fluid leaking or gushing from your vagina. You have bright red blood coming from your vagina. Your baby is not moving inside you as much as it used to. Summary You may have practice contractions called Braxton Hicks contractions. These false labor contractions are sometimes confused with true labor. Braxton Hicks contractions are usually shorter, weaker, farther apart, and less regular than true labor contractions. True labor contractions usually become stronger, more regular, and more frequent. Manage discomfort from [facility] contractions by changing position, resting in a warm bath, practicing deep breathing, and drinking plenty of water. Keep all follow-up visits. Contact your health care provider if your contractions become stronger, more regular, and closer together. This information is not intended to replace advice given to you by your health care provider. Make sure you discuss any questions you have with your healthcare provider. Document Revised: 09/04/2020 Document Reviewed: 09/04/2020 Elsevier Patient Education  2022 [employer].

## 2021-05-24 NOTE — Progress Notes (Signed)
ROB: Discussed MRI findings.  Likely follow-up postpartum.  All questions answered.  We have discussed her exercise routine and discussed some limitations.  She and her husband have decided to remain on the MD side.  1 hour GCT next visit.

## 2021-05-29 NOTE — Telephone Encounter (Signed)
Please advise. Thanks Neamiah Sciarra 

## 2021-06-07 ENCOUNTER — Other Ambulatory Visit: Payer: Self-pay | Admitting: Obstetrics and Gynecology

## 2021-06-07 DIAGNOSIS — O3483 Maternal care for other abnormalities of pelvic organs, third trimester: Secondary | ICD-10-CM

## 2021-06-07 DIAGNOSIS — N83209 Unspecified ovarian cyst, unspecified side: Secondary | ICD-10-CM

## 2021-06-07 DIAGNOSIS — Z3A28 28 weeks gestation of pregnancy: Secondary | ICD-10-CM

## 2021-06-12 ENCOUNTER — Other Ambulatory Visit: Payer: Self-pay

## 2021-06-12 ENCOUNTER — Ambulatory Visit: Payer: BC Managed Care – PPO | Attending: Obstetrics and Gynecology

## 2021-06-12 DIAGNOSIS — N83202 Unspecified ovarian cyst, left side: Secondary | ICD-10-CM | POA: Diagnosis not present

## 2021-06-12 DIAGNOSIS — Z3A28 28 weeks gestation of pregnancy: Secondary | ICD-10-CM | POA: Diagnosis not present

## 2021-06-12 DIAGNOSIS — O3483 Maternal care for other abnormalities of pelvic organs, third trimester: Secondary | ICD-10-CM

## 2021-06-12 DIAGNOSIS — N83209 Unspecified ovarian cyst, unspecified side: Secondary | ICD-10-CM

## 2021-06-13 ENCOUNTER — Other Ambulatory Visit: Payer: BC Managed Care – PPO

## 2021-06-13 ENCOUNTER — Encounter: Payer: Self-pay | Admitting: Obstetrics and Gynecology

## 2021-06-13 ENCOUNTER — Other Ambulatory Visit: Payer: Self-pay | Admitting: Surgical

## 2021-06-13 ENCOUNTER — Ambulatory Visit (INDEPENDENT_AMBULATORY_CARE_PROVIDER_SITE_OTHER): Payer: BC Managed Care – PPO | Admitting: Obstetrics and Gynecology

## 2021-06-13 VITALS — BP 134/83 | HR 113 | Wt 164.0 lb

## 2021-06-13 DIAGNOSIS — Z23 Encounter for immunization: Secondary | ICD-10-CM

## 2021-06-13 DIAGNOSIS — Z3A28 28 weeks gestation of pregnancy: Secondary | ICD-10-CM

## 2021-06-13 DIAGNOSIS — N9489 Other specified conditions associated with female genital organs and menstrual cycle: Secondary | ICD-10-CM

## 2021-06-13 DIAGNOSIS — Z3402 Encounter for supervision of normal first pregnancy, second trimester: Secondary | ICD-10-CM

## 2021-06-13 DIAGNOSIS — Z13220 Encounter for screening for lipoid disorders: Secondary | ICD-10-CM

## 2021-06-13 DIAGNOSIS — Z113 Encounter for screening for infections with a predominantly sexual mode of transmission: Secondary | ICD-10-CM

## 2021-06-13 LAB — POCT URINALYSIS DIPSTICK OB
Bilirubin, UA: NEGATIVE
Blood, UA: NEGATIVE
Glucose, UA: NEGATIVE
Ketones, UA: NEGATIVE
Leukocytes, UA: NEGATIVE
Nitrite, UA: NEGATIVE
Spec Grav, UA: 1.015 (ref 1.010–1.025)
Urobilinogen, UA: 0.2 E.U./dL
pH, UA: 6.5 (ref 5.0–8.0)

## 2021-06-13 NOTE — Patient Instructions (Signed)
[email or web address]">  Third Trimester of Pregnancy  The third trimester of pregnancy is from week 28 through week 40. This is months 7 through 9. The third trimester is a time when the unborn baby (fetus) is growing rapidly. At the end of the ninth month, the fetus is about 20inches long and weighs 6-10 pounds. Body changes during your third trimester During the third trimester, your body will continue to go through many changes.The changes vary and generally return to normal after your baby is born. Physical changes Your weight will continue to increase. You can expect to gain 25-35 pounds (11-16 kg) by the end of the pregnancy if you begin pregnancy at a normal weight. If you are underweight, you can expect to gain 28-40 lb (about 13-18 kg), and if you are overweight, you can expect to gain 15-25 lb (about 7-11 kg). You may begin to get stretch marks on your hips, abdomen, and breasts. Your breasts will continue to grow and may hurt. A yellow fluid (colostrum) may leak from your breasts. This is the first milk you are producing for your baby. You may have changes in your hair. These can include thickening of your hair, rapid growth, and changes in texture. Some people also have hair loss during or after pregnancy, or hair that feels dry or thin. Your belly button may stick out. You may notice more swelling in your hands, face, or ankles. Health changes You may have heartburn. You may have constipation. You may develop hemorrhoids. You may develop swollen, bulging veins (varicose veins) in your legs. You may have increased body aches in the pelvis, back, or thighs. This is due to weight gain and increased hormones that are relaxing your joints. You may have increased tingling or numbness in your hands, arms, and legs. The skin on your abdomen may also feel numb. You may feel short of breath because of your expanding uterus. Other  changes You may urinate more often because the fetus is moving lower into your pelvis and pressing on your bladder. You may have more problems sleeping. This may be caused by the size of your abdomen, an increased need to urinate, and an increase in your body's metabolism. You may notice the fetus "dropping," or moving lower in your abdomen (lightening). You may have increased vaginal discharge. You may notice that you have pain around your pelvic bone as your uterus distends. Follow these instructions at home: Medicines Follow your health care provider's instructions regarding medicine use. Specific medicines may be either safe or unsafe to take during pregnancy. Do not take any medicines unless approved by your health care provider. Take a prenatal vitamin that contains at least 600 micrograms (mcg) of folic acid. Eating and drinking Eat a healthy diet that includes fresh fruits and vegetables, whole grains, good sources of protein such as meat, eggs, or tofu, and low-fat dairy products. Avoid raw meat and unpasteurized juice, milk, and cheese. These carry germs that can harm you and your baby. Eat 4 or 5 small meals rather than 3 large meals a day. You may need to take these actions to prevent or treat constipation: Drink enough fluid to keep your urine pale yellow. Eat foods that are high in fiber, such as beans, whole grains, and fresh fruits and vegetables. Limit foods that are high in fat and processed sugars, such as fried or sweet foods. Activity Exercise only as directed by your health care provider. Most people can continue their usual exercise routine during pregnancy. Try  to exercise for 30 minutes at least 5 days a week. Stop exercising if you experience contractions in the uterus. Stop exercising if you develop pain or cramping in the lower abdomen or lower back. Avoid heavy lifting. Do not exercise if it is very hot or humid or if you are at a high altitude. If you choose to,  you may continue to have sex unless your health care provider tells you not to. Relieving pain and discomfort Take frequent breaks and rest with your legs raised (elevated) if you have leg cramps or low back pain. Take warm sitz baths to soothe any pain or discomfort caused by hemorrhoids. Use hemorrhoid cream if your health care provider approves. Wear a supportive bra to prevent discomfort from breast tenderness. If you develop varicose veins: Wear support hose as told by your health care provider. Elevate your feet for 15 minutes, 3-4 times a day. Limit salt in your diet. Safety Talk to your health care provider before traveling far distances. Do not use hot tubs, steam rooms, or saunas. Wear your seat belt at all times when driving or riding in a car. Talk with your health care provider if someone is verbally or physically abusive to you. Preparing for birth To prepare for the arrival of your baby: Take prenatal classes to understand, practice, and ask questions about labor and delivery. Visit the hospital and tour the maternity area. Purchase a rear-facing car seat and make sure you know how to install it in your car. Prepare the baby's room or sleeping area. Make sure to remove all pillows and stuffed animals from the baby's crib to prevent suffocation. General instructions Avoid cat litter boxes and soil used by cats. These carry germs that can cause birth defects in the baby. If you have a cat, ask someone to clean the litter box for you. Do not douche or use tampons. Do not use scented sanitary pads. Do not use any products that contain nicotine or tobacco, such as cigarettes, e-cigarettes, and chewing tobacco. If you need help quitting, ask your health care provider. Do not use any herbal remedies, illegal drugs, or medicines that were not prescribed to you. Chemicals in these products can harm your baby. Do not drink alcohol. You will have more frequent prenatal exams during the  third trimester. During a routine prenatal visit, your health care provider will do a physical exam, perform tests, and discuss your overall health. Keep all follow-up visits. This is important. Where to find more information American Pregnancy Association: americanpregnancy.[place] and Gynecologists: PoolDevices.com.pt Office on [employer]: KeywordPortfolios.com.br Contact a health care provider if you have: A fever. Mild pelvic cramps, pelvic pressure, or nagging pain in your abdominal area or lower back. Vomiting or diarrhea. Bad-smelling vaginal discharge or foul-smelling urine. Pain when you urinate. A headache that does not go away when you take medicine. Visual changes or see spots in front of your eyes. Get help right away if: Your water breaks. You have regular contractions less than 5 minutes apart. You have spotting or bleeding from your vagina. You have severe abdominal pain. You have difficulty breathing. You have chest pain. You have fainting spells. You have not felt your baby move for the time period told by your health care provider. You have new or increased pain, swelling, or redness in an arm or leg. Summary The third trimester of pregnancy is from week 28 through week 40 (months 7 through 9). You may have more problems  sleeping. This can be caused by the size of your abdomen, an increased need to urinate, and an increase in your body's metabolism. You will have more frequent prenatal exams during the third trimester. Keep all follow-up visits. This is important. This information is not intended to replace advice given to you by your health care provider. Make sure you discuss any questions you have with your healthcare provider. Document Revised: 04/05/2020 Document Reviewed: 02/10/2020 Elsevier Patient Education  2022 [employer].

## 2021-06-13 NOTE — Progress Notes (Signed)
ROB: She is doing well, no new concerns. She had her TDAP and signed BTC form today.

## 2021-06-13 NOTE — Progress Notes (Signed)
ROB: Patient with no major complaints. Reviewed ultrasound findings from MFM visit yesterday. Normal growth (58%ile), adnexal mass remains stable in size, 10 cm.  Discussed recommendations for management. For 28 week labs today.  Plans to plans to breastfeed, conisdering Combination OCPs for contraception. For Tdap today, signed blood consent.

## 2021-06-14 LAB — CBC
Hematocrit: 39 % (ref 34.0–46.6)
Hemoglobin: 12.6 g/dL (ref 11.1–15.9)
MCH: 27.9 pg (ref 26.6–33.0)
MCHC: 32.3 g/dL (ref 31.5–35.7)
MCV: 87 fL (ref 79–97)
Platelets: 271 10*3/uL (ref 150–450)
RBC: 4.51 x10E6/uL (ref 3.77–5.28)
RDW: 12.6 % (ref 11.7–15.4)
WBC: 11.4 10*3/uL — ABNORMAL HIGH (ref 3.4–10.8)

## 2021-06-14 LAB — RPR: RPR Ser Ql: NONREACTIVE

## 2021-06-14 LAB — GLUCOSE, 1 HOUR GESTATIONAL: Gestational Diabetes Screen: 102 mg/dL (ref 65–139)

## 2021-06-28 ENCOUNTER — Ambulatory Visit (INDEPENDENT_AMBULATORY_CARE_PROVIDER_SITE_OTHER): Payer: BC Managed Care – PPO | Admitting: Obstetrics and Gynecology

## 2021-06-28 ENCOUNTER — Other Ambulatory Visit: Payer: Self-pay

## 2021-06-28 VITALS — BP 127/83 | HR 99 | Wt 171.9 lb

## 2021-06-28 DIAGNOSIS — Z3403 Encounter for supervision of normal first pregnancy, third trimester: Secondary | ICD-10-CM

## 2021-06-28 DIAGNOSIS — Z3A30 30 weeks gestation of pregnancy: Secondary | ICD-10-CM

## 2021-06-28 LAB — POCT URINALYSIS DIPSTICK OB
Bilirubin, UA: NEGATIVE
Blood, UA: NEGATIVE
Glucose, UA: NEGATIVE
Ketones, UA: NEGATIVE
Leukocytes, UA: NEGATIVE
Nitrite, UA: NEGATIVE
POC,PROTEIN,UA: NEGATIVE
Spec Grav, UA: <=1.005 — AB (ref 1.010–1.025)
Urobilinogen, UA: 0.2 E.U./dL
pH, UA: 7 (ref 5.0–8.0)

## 2021-06-28 NOTE — Progress Notes (Signed)
ROB: Doing well.  No complaints.  Reports baby very active.  Discussed management of abdominal cyst.

## 2021-07-11 ENCOUNTER — Ambulatory Visit (INDEPENDENT_AMBULATORY_CARE_PROVIDER_SITE_OTHER): Payer: BC Managed Care – PPO | Admitting: Obstetrics and Gynecology

## 2021-07-11 ENCOUNTER — Encounter: Payer: Self-pay | Admitting: Obstetrics and Gynecology

## 2021-07-11 ENCOUNTER — Other Ambulatory Visit: Payer: Self-pay

## 2021-07-11 VITALS — BP 123/78 | HR 91 | Wt 173.4 lb

## 2021-07-11 DIAGNOSIS — N9489 Other specified conditions associated with female genital organs and menstrual cycle: Secondary | ICD-10-CM

## 2021-07-11 DIAGNOSIS — Z3403 Encounter for supervision of normal first pregnancy, third trimester: Secondary | ICD-10-CM

## 2021-07-11 DIAGNOSIS — Z3A32 32 weeks gestation of pregnancy: Secondary | ICD-10-CM

## 2021-07-11 LAB — POCT URINALYSIS DIPSTICK OB
Bilirubin, UA: NEGATIVE
Blood, UA: NEGATIVE
Glucose, UA: NEGATIVE
Ketones, UA: NEGATIVE
Leukocytes, UA: NEGATIVE
Nitrite, UA: NEGATIVE
POC,PROTEIN,UA: NEGATIVE
Spec Grav, UA: <=1.005 — AB (ref 1.010–1.025)
Urobilinogen, UA: 0.2 E.U./dL
pH, UA: 7 (ref 5.0–8.0)

## 2021-07-11 NOTE — Progress Notes (Signed)
ROB: Notes increase in discharge during the day, no itching/burning/odor.  Worsens with exercise but is now limiting this.  Next MFM scan is due 9/27 to f/u growth. Has selected Pediatrician, [facility]. Reviewed 3rd trimester expectations. Discussed birth plans, patient unsure, given handout. RTC in 2 weeks.

## 2021-07-11 NOTE — Progress Notes (Signed)
OB-Pt present for routine prenatal care. Pt c/o leaking watery not enough to wear a pad, no odor and light cramping after work outs. Pt stated that she is no longer going to do her regular workout sessions.

## 2021-07-11 NOTE — Patient Instructions (Addendum)
Breastfeeding and Breast Care It is normal to have some problems when you start to breastfeed your new baby. But there are things that you can do to take care of yourself and help prevent problems. This includes keeping your breasts healthy and making sure that your baby's mouth attaches (latches) properly to your nipple for feedings. Work with your doctor or breastfeeding specialist to find what works best for you. How does self-care benefit me? If you keep your breasts healthy and you let your baby attach to your nipples in the right way, you will avoid these problems: Cracked or sore nipples. Breasts becoming overfilled with milk. Plugged milk ducts. Low milk supply. Breast swelling or infection. How does self-care benefit my baby? By preventing problems with your breasts, you will ensure that your baby will feed well and will gain the right amount of weight. What actions can I take to care for myself during breastfeeding? Best ways to breastfeed Always make sure that your baby latches properly to breastfeed. Make sure that your baby is in a proper position. Try different breastfeeding positions to find one that works best for you and your baby. Breastfeed when you feel like you need to make your breasts less full or when your baby shows signs of hunger. This is called "breastfeeding on demand." Do not delay feedings. Try to relax when it is time to feed your baby. This helps your body release milk from your breast. To help increase milk flow, do these things before feeding: Remove a small amount of milk from your breast. Use a pump or squeeze with your hand. Apply warm, moist heat to your breast. Do this in the shower or use hand towels soaked with warm water. Massage your breasts. Do this when you are breastfeeding as well. Caring for your breasts   To help your breasts stay healthy and keep them from getting too dry: Avoid using soap on your nipples. Let your nipples air-dry for 3-4  minutes after each feeding. Do not use things like a hair dryer to dry your breasts. This can make the skin dry and will cause irritation and pain. Use only cotton bra pads to soak up breast milk that leaks. Change the pads if they become soaked with milk. If you use bra pads that can be thrown away, change them often. Put some lanolin on your nipples after breastfeeding. Pure lanolin does not need to be washed off your nipple before you feed your baby again. Pure lanolin is not harmful to your baby. Rub some breast milk into your nipples: Use your hand to squeeze out a few drops of breast milk. Gently massage the milk into your nipples. Let your nipples air-dry. Wear a supportive nursing bra. Avoid wearing: Tight clothing. Underwire bras or bras that put pressure on your breasts. Use ice to help relieve pain or swelling of your breasts: Put ice in a plastic bag. Place a towel between your skin and the bag. Leave the ice on for 20 minutes, 2-3 times a day. Follow these instructions at home: Drink enough fluid to keep your pee (urine) pale yellow. Get plenty of rest. Sleep when your baby sleeps. Talk to your doctor or breastfeeding specialist before taking any herbal supplements. Eat a balanced diet. This includes fruits, vegetables, whole grains, lean proteins, and dairy or dairy alternatives Contact a health care provider if: You have nipple pain. You have cracking or soreness in your nipples that lasts longer than 1 week. Your breasts are  overfilled with milk, and this lasts longer than 48 hours. You have a fever. You have pus-like fluid coming from your nipple. You have redness, a rash, swelling, itching, or burning on your breast. Your baby does not gain weight. Your baby loses weight. Your baby is not feeding regularly or is very sleepy and lacks energy. Summary There are things that you can do to take care of yourself and help prevent many common breastfeeding problems. Always  make sure that your baby's mouth attaches (latches) to your nipple properly to breastfeed. Keep your nipples from getting too dry, drink plenty of fluid, and get plenty of rest. Feed on demand. Do not delay feedings. This information is not intended to replace advice given to you by your health care provider. Make sure you discuss any questions you have with your health care provider. Document Revised: 04/18/2020 Document Reviewed: 04/18/2020 Elsevier Patient Education  [place].

## 2021-07-24 ENCOUNTER — Encounter: Payer: BC Managed Care – PPO | Admitting: Certified Nurse Midwife

## 2021-07-24 ENCOUNTER — Telehealth: Payer: Self-pay

## 2021-07-24 ENCOUNTER — Encounter: Payer: BC Managed Care – PPO | Admitting: Obstetrics and Gynecology

## 2021-07-24 DIAGNOSIS — Z3A34 34 weeks gestation of pregnancy: Secondary | ICD-10-CM

## 2021-07-24 DIAGNOSIS — Z3403 Encounter for supervision of normal first pregnancy, third trimester: Secondary | ICD-10-CM

## 2021-07-24 NOTE — Telephone Encounter (Signed)
Spoke to pt and informed her that DJE had to leave for an emergency visit at the hospital and she would have to reschedule her appointment. Pt stated that she did not want to wait until 2 weeks to be seen by [facility] Pt was scheduled to see DJE at 7:30am on 07/24/2021.

## 2021-07-31 NOTE — Patient Instructions (Signed)

## 2021-08-01 ENCOUNTER — Other Ambulatory Visit: Payer: Self-pay

## 2021-08-01 ENCOUNTER — Encounter: Payer: Self-pay | Admitting: Obstetrics and Gynecology

## 2021-08-01 ENCOUNTER — Ambulatory Visit (INDEPENDENT_AMBULATORY_CARE_PROVIDER_SITE_OTHER): Payer: BC Managed Care – PPO | Admitting: Obstetrics and Gynecology

## 2021-08-01 VITALS — BP 131/82 | HR 93 | Wt 180.1 lb

## 2021-08-01 DIAGNOSIS — Z3403 Encounter for supervision of normal first pregnancy, third trimester: Secondary | ICD-10-CM

## 2021-08-01 DIAGNOSIS — Z3A35 35 weeks gestation of pregnancy: Secondary | ICD-10-CM

## 2021-08-01 LAB — POCT URINALYSIS DIPSTICK OB
Bilirubin, UA: NEGATIVE
Blood, UA: NEGATIVE
Glucose, UA: NEGATIVE
Ketones, UA: NEGATIVE
Nitrite, UA: NEGATIVE
POC,PROTEIN,UA: NEGATIVE
Spec Grav, UA: 1.01 (ref 1.010–1.025)
Urobilinogen, UA: 0.2 E.U./dL
pH, UA: 7 (ref 5.0–8.0)

## 2021-08-01 MED ORDER — DOXYLAMINE-PYRIDOXINE 10-10 MG PO TBEC: 10.0000 mg | DELAYED_RELEASE_TABLET | Freq: Every day | ORAL | 0 refills | Status: DC

## 2021-08-01 NOTE — Progress Notes (Signed)
ROB: Patient complains of occasional, lasting 30 seconds, left side/flank pain.  This pain is not disabling.  Denies significant contractions.  Reports daily fetal movement.  GBS-GC/CT performed today.

## 2021-08-01 NOTE — Progress Notes (Signed)
OB-Pt present for routine prenatal care and 36 week cultures. Pt c/o lower abd pain/pressure along with sharp pains in the left side.   Pt declined flu vaccine.

## 2021-08-01 NOTE — Addendum Note (Signed)
Addended by: Edwyna Shell on: 08/01/2021 03:20 PM   Modules accepted: Orders

## 2021-08-02 ENCOUNTER — Other Ambulatory Visit: Payer: Self-pay

## 2021-08-02 DIAGNOSIS — O3483 Maternal care for other abnormalities of pelvic organs, third trimester: Secondary | ICD-10-CM

## 2021-08-02 DIAGNOSIS — N83209 Unspecified ovarian cyst, unspecified side: Secondary | ICD-10-CM

## 2021-08-02 DIAGNOSIS — N9489 Other specified conditions associated with female genital organs and menstrual cycle: Secondary | ICD-10-CM

## 2021-08-03 LAB — STREP GP B NAA: Strep Gp B NAA: POSITIVE — AB

## 2021-08-05 LAB — GC/CHLAMYDIA PROBE AMP
Chlamydia trachomatis, NAA: NEGATIVE
Neisseria Gonorrhoeae by PCR: NEGATIVE

## 2021-08-07 ENCOUNTER — Ambulatory Visit: Payer: BC Managed Care – PPO | Attending: Maternal & Fetal Medicine

## 2021-08-07 ENCOUNTER — Other Ambulatory Visit: Payer: Self-pay

## 2021-08-07 DIAGNOSIS — O3483 Maternal care for other abnormalities of pelvic organs, third trimester: Secondary | ICD-10-CM

## 2021-08-07 DIAGNOSIS — Z3A36 36 weeks gestation of pregnancy: Secondary | ICD-10-CM | POA: Insufficient documentation

## 2021-08-07 DIAGNOSIS — N83209 Unspecified ovarian cyst, unspecified side: Secondary | ICD-10-CM | POA: Insufficient documentation

## 2021-08-07 DIAGNOSIS — N83202 Unspecified ovarian cyst, left side: Secondary | ICD-10-CM

## 2021-08-08 ENCOUNTER — Encounter: Payer: Self-pay | Admitting: Obstetrics and Gynecology

## 2021-08-08 ENCOUNTER — Ambulatory Visit (INDEPENDENT_AMBULATORY_CARE_PROVIDER_SITE_OTHER): Payer: BC Managed Care – PPO | Admitting: Obstetrics and Gynecology

## 2021-08-08 ENCOUNTER — Other Ambulatory Visit: Payer: Self-pay

## 2021-08-08 VITALS — BP 117/80 | HR 89 | Wt 183.6 lb

## 2021-08-08 DIAGNOSIS — Z3403 Encounter for supervision of normal first pregnancy, third trimester: Secondary | ICD-10-CM

## 2021-08-08 DIAGNOSIS — Z3A36 36 weeks gestation of pregnancy: Secondary | ICD-10-CM

## 2021-08-08 DIAGNOSIS — O99820 Streptococcus B carrier state complicating pregnancy: Secondary | ICD-10-CM | POA: Insufficient documentation

## 2021-08-08 DIAGNOSIS — N9489 Other specified conditions associated with female genital organs and menstrual cycle: Secondary | ICD-10-CM

## 2021-08-08 LAB — POCT URINALYSIS DIPSTICK OB
Bilirubin, UA: NEGATIVE
Blood, UA: NEGATIVE
Glucose, UA: NEGATIVE
Ketones, UA: NEGATIVE
Nitrite, UA: NEGATIVE
POC,PROTEIN,UA: NEGATIVE
Spec Grav, UA: 1.01 (ref 1.010–1.025)
Urobilinogen, UA: 0.2 E.U./dL
pH, UA: 7 (ref 5.0–8.0)

## 2021-08-08 NOTE — Progress Notes (Signed)
ROB: She is doing well, no concern today.

## 2021-08-08 NOTE — Progress Notes (Signed)
ROB: Patient doing well, no complaints. Reviewed GBS status, will need antibiotics in labor. Discussed labor precautions.Reviewed recent MFM ultrasound from yesterday, fetal growth 61%ile, BPP 8/8, and adnexal cyst 10.5 cm (minimal change). RTC in 1 week.

## 2021-08-15 ENCOUNTER — Encounter: Payer: Self-pay | Admitting: Obstetrics and Gynecology

## 2021-08-15 ENCOUNTER — Ambulatory Visit (INDEPENDENT_AMBULATORY_CARE_PROVIDER_SITE_OTHER): Payer: BC Managed Care – PPO | Admitting: Obstetrics and Gynecology

## 2021-08-15 ENCOUNTER — Other Ambulatory Visit: Payer: Self-pay

## 2021-08-15 VITALS — BP 137/84 | HR 101 | Wt 183.7 lb

## 2021-08-15 DIAGNOSIS — Z3403 Encounter for supervision of normal first pregnancy, third trimester: Secondary | ICD-10-CM

## 2021-08-15 DIAGNOSIS — R109 Unspecified abdominal pain: Secondary | ICD-10-CM

## 2021-08-15 DIAGNOSIS — Z3A37 37 weeks gestation of pregnancy: Secondary | ICD-10-CM

## 2021-08-15 NOTE — Progress Notes (Signed)
ROB: She continues to have some left flank pain and a little urinary urgency. Urine Culture collected.

## 2021-08-15 NOTE — Progress Notes (Signed)
ROB: Doing well.  Occasional left-sided/left flank pain.  Denies contractions.  Signs and symptoms of labor discussed.  Patient feeling anxiety regarding future delivery- arranged tour of labor and delivery.

## 2021-08-18 LAB — URINE CULTURE

## 2021-08-21 ENCOUNTER — Telehealth: Payer: Self-pay

## 2021-08-21 NOTE — Telephone Encounter (Signed)
Call transferred from the front desk.   OB 38 4/7  Pt states she started to get a h/a at work. She tool tylenol and checked her bp- 140/90. 138/84 at last visit.   She went home to rest. Checked it again - 137/91.  No dizziness or blurred vision.   Slight swelling in hands and feet- only there has been no change for several weeks.   Pt advised to take a bp log tonight and in the a.m.  Push fluids. Elevated legs above her heart. She will call me in the morning with an update.   Pt advised if she has a h/a not relieved with tylenol, dizziness or blurred vision she will need to go to the ED.   Pt voiced understanding.

## 2021-08-22 ENCOUNTER — Encounter: Payer: BC Managed Care – PPO | Admitting: Obstetrics and Gynecology

## 2021-08-24 ENCOUNTER — Encounter: Payer: Self-pay | Admitting: Obstetrics and Gynecology

## 2021-08-24 ENCOUNTER — Other Ambulatory Visit: Payer: Self-pay

## 2021-08-24 ENCOUNTER — Ambulatory Visit (INDEPENDENT_AMBULATORY_CARE_PROVIDER_SITE_OTHER): Payer: BC Managed Care – PPO | Admitting: Obstetrics and Gynecology

## 2021-08-24 VITALS — BP 144/94 | HR 100 | Wt 182.4 lb

## 2021-08-24 DIAGNOSIS — O169 Unspecified maternal hypertension, unspecified trimester: Secondary | ICD-10-CM

## 2021-08-24 DIAGNOSIS — Z3403 Encounter for supervision of normal first pregnancy, third trimester: Secondary | ICD-10-CM

## 2021-08-24 DIAGNOSIS — Z3A39 39 weeks gestation of pregnancy: Secondary | ICD-10-CM

## 2021-08-24 LAB — POCT URINALYSIS DIPSTICK OB
Bilirubin, UA: NEGATIVE
Glucose, UA: NEGATIVE
Ketones, UA: NEGATIVE
Nitrite, UA: NEGATIVE
POC,PROTEIN,UA: NEGATIVE
Spec Grav, UA: 1.01 (ref 1.010–1.025)
Urobilinogen, UA: 0.2 E.U./dL
pH, UA: 6.5 (ref 5.0–8.0)

## 2021-08-24 MED ORDER — DOXYLAMINE-PYRIDOXINE 10-10 MG PO TBEC: 10.0000 mg | DELAYED_RELEASE_TABLET | Freq: Every day | ORAL | 0 refills | Status: DC

## 2021-08-24 NOTE — Progress Notes (Signed)
ROB: She has concerns that her diastolic bp has been in the range of 92 and she has increased swelling. Pain in her vagina when she walks.

## 2021-08-24 NOTE — Patient Instructions (Addendum)
COVID 19 Instructions for Scheduled Procedure (Inductions/C-sections and GYN surgeries)   Thank you for choosing [facility] for your services.  You have been scheduled for a procedure called _______Induction of Labor____________.    Your procedure is scheduled on _______Tuesday, October 18, 2022_________.  You are required to have COVID-19 testing performed 1-2 days prior to your scheduled procedure date.  Testing is performed between 8 AM and 12 PM Monday through Friday.  Please present for testing on __Monday, October 17, 2022___________ during this hour. Testing is performed in the [place] (this is next to the [employer]).    Upon your scheduled procedure date, you will need to arrive at the [place] entrance. (There is a statue at the front of this entrance.)   Please arrive on time if you are scheduled for an induction of labor.    If you are scheduled for a Cesarean delivery or for Gyn Surgery, arrive 2 hours prior to your procedure time.  If you are an Obstetric patient and your arrival time falls between 11 PM and 6 AM call L&D [phone]) when you arrive.  A staff member will meet you at the [place] entrance.  At this time, patients are allowed 1 support person to accompany them. Face masks are required for you and your support person. Your support person is now allowed to be there with you during the entire time of your admission.   Please contact the office if you have any questions regarding this information.  The [facility] office number is [phone].     Thank you,    Your [facility] Providers        Labor Induction Labor induction is when steps are taken to cause a pregnant woman to begin the labor process. Most women go into labor on their own between 37 weeks and 42 weeks of pregnancy. When this does not happen, or when there is a medical need for labor to begin, steps may be taken to induce, or bring on, labor. Labor  induction causes a pregnant woman's uterus to contract. It also causes the cervix to soften (ripen), open (dilate), and thin out. Usually, labor is not induced before 39 weeks of pregnancy unless there is a medical reason to do so. When is labor induction considered? Labor induction may be right for you if: Your pregnancy lasts longer than 41 to 42 weeks. Your placenta is separating from your uterus (placental abruption). You have a rupture of membranes and your labor does not begin. You have health problems, like diabetes or high blood pressure (preeclampsia) during your pregnancy. Your baby has stopped growing or does not have enough amniotic fluid. Before labor induction begins, your health care provider will consider the following factors: Your medical condition and the baby's condition. How many weeks you have been pregnant. How mature the baby's lungs are. The condition of your cervix. The position of the baby. The size of your birth canal. Tell a health care provider about: Any allergies you have. All medicines you are taking, including vitamins, herbs, eye drops, creams, and over-the-counter medicines. Any problems you or your family members have had with anesthetic medicines. Any surgeries you have had. Any blood disorders you have. Any medical conditions you have. What are the risks? Generally, this is a safe procedure. However, problems may occur, including: Failed induction. Changes in fetal heart rate, such as being too high, too low, or irregular (erratic). Infection in the mother or  the baby. Increased risk of having a cesarean delivery. Breaking off (abruption) of the placenta from the uterus. This is rare. Rupture of the uterus. This is very rare. Your baby could fail to get enough blood flow or oxygen. This can be life-threatening. When induction is needed for medical reasons, the benefits generally outweigh the risks. What happens during the procedure? During the  procedure, your health care provider will use one of these methods to induce labor: Stripping the membranes. In this method, the amniotic sac tissue is gently separated from the cervix. This causes the following to happen: Your cervix stretches, which in turn causes the release of prostaglandins. Prostaglandins induce labor and cause the uterus to contract. This procedure is often done in an office visit. You will be sent home to wait for contractions to begin. Prostaglandin medicine. This medicine starts contractions and causes the cervix to dilate and ripen. This can be taken by mouth (orally) or by being inserted into the vagina (suppository). Inserting a small, thin tube (catheter) with a balloon into the vagina and then expanding the balloon with water to dilate the cervix. Breaking the water. In this method, a small instrument is used to make a small hole in the amniotic sac. This eventually causes the amniotic sac to break. Contractions should begin within a few hours. Medicine to trigger or strengthen contractions. This medicine is given through an IV that is inserted into a vein in your arm. This procedure may vary among health care providers and hospitals. Where to find more information March of [employer]: www.marchofdimes.org [employer] of Obstetricians and Gynecologists: www.acog.org Summary Labor induction causes a pregnant woman's uterus to contract. It also causes the cervix to soften (ripen), open (dilate), and thin out. Labor is usually not induced before 39 weeks of pregnancy unless there is a medical reason to do so. When induction is needed for medical reasons, the benefits generally outweigh the risks. Talk with your health care provider about which methods of labor induction are right for you. This information is not intended to replace advice given to you by your health care provider. Make sure you discuss any questions you have with your health care provider. Document  Revised: 08/10/2020 Document Reviewed: 08/10/2020 Elsevier Patient Education  [place].

## 2021-08-24 NOTE — Progress Notes (Signed)
ROB: Had a headache last week, took her BP at home, and was elevated. Also noticed an elevated at work today.  BP today elevated. Has also noted some swelling. Discussed that patient may be attempting to develop gestational HTN at term. No proteinuria today. She will return for BP check on Monday but will likely need IOL.  Scheduled for 08/28/2021 at noon. Discussed need for COVID testing prior to admission. Discussed visitation policy in hospital.

## 2021-08-25 NOTE — Addendum Note (Signed)
Addended by: Augusto Gamble on: 08/25/2021 07:02 PM   Modules accepted: Orders, SmartSet

## 2021-08-27 ENCOUNTER — Ambulatory Visit: Payer: BC Managed Care – PPO | Admitting: Obstetrics and Gynecology

## 2021-08-27 ENCOUNTER — Other Ambulatory Visit
Admission: RE | Admit: 2021-08-27 | Discharge: 2021-08-27 | Disposition: A | Discharge status: Home / Self Care | Payer: BC Managed Care – PPO | Source: Ambulatory Visit | Attending: Obstetrics and Gynecology | Admitting: Obstetrics and Gynecology

## 2021-08-27 ENCOUNTER — Other Ambulatory Visit: Payer: Self-pay

## 2021-08-27 VITALS — BP 117/83 | HR 83

## 2021-08-27 DIAGNOSIS — Z0130 Encounter for examination of blood pressure without abnormal findings: Secondary | ICD-10-CM

## 2021-08-27 DIAGNOSIS — Z01812 Encounter for preprocedural laboratory examination: Secondary | ICD-10-CM | POA: Insufficient documentation

## 2021-08-27 DIAGNOSIS — Z20822 Contact with and (suspected) exposure to covid-19: Secondary | ICD-10-CM | POA: Insufficient documentation

## 2021-08-27 LAB — SARS CORONAVIRUS 2 (TAT 6-24 HRS): SARS Coronavirus 2: NEGATIVE

## 2021-08-27 NOTE — Progress Notes (Unsigned)
Sara Richardson is [redacted]w[redacted]d. She is schedule to be induced tomorrow at noon. Patient is here for a blood pressure check. Patient denies chest pain, palpitations, shortness of breath or visual disturbances. At previous visit blood pressure was 144 94 with a heart rate of 100. Today during nurse visit first check blood pressure was 117/83  with heart rate of 83.  She complains of heart flutters that come and go. She has a hx of SVT. She has been having headaches that comes and goes. She has been trying to lay down and rest mostly.

## 2021-08-28 ENCOUNTER — Inpatient Hospital Stay: Payer: BC Managed Care – PPO | Admitting: Anesthesiology

## 2021-08-28 ENCOUNTER — Other Ambulatory Visit: Payer: Self-pay

## 2021-08-28 ENCOUNTER — Encounter: Payer: Self-pay | Admitting: Obstetrics and Gynecology

## 2021-08-28 ENCOUNTER — Inpatient Hospital Stay
Admission: RE | Admit: 2021-08-28 | Discharge: 2021-08-30 | DRG: 807 | Disposition: A | Discharge status: Home / Self Care | Payer: BC Managed Care – PPO | Attending: Obstetrics and Gynecology | Admitting: Obstetrics and Gynecology

## 2021-08-28 DIAGNOSIS — O139 Gestational [pregnancy-induced] hypertension without significant proteinuria, unspecified trimester: Secondary | ICD-10-CM | POA: Diagnosis present

## 2021-08-28 DIAGNOSIS — O134 Gestational [pregnancy-induced] hypertension without significant proteinuria, complicating childbirth: Principal | ICD-10-CM | POA: Diagnosis present

## 2021-08-28 DIAGNOSIS — O99892 Other specified diseases and conditions complicating childbirth: Secondary | ICD-10-CM | POA: Diagnosis present

## 2021-08-28 DIAGNOSIS — O99824 Streptococcus B carrier state complicating childbirth: Secondary | ICD-10-CM | POA: Diagnosis present

## 2021-08-28 DIAGNOSIS — O326XX0 Maternal care for compound presentation, not applicable or unspecified: Secondary | ICD-10-CM | POA: Diagnosis present

## 2021-08-28 DIAGNOSIS — O3483 Maternal care for other abnormalities of pelvic organs, third trimester: Secondary | ICD-10-CM | POA: Diagnosis not present

## 2021-08-28 DIAGNOSIS — Z8759 Personal history of other complications of pregnancy, childbirth and the puerperium: Secondary | ICD-10-CM | POA: Diagnosis present

## 2021-08-28 DIAGNOSIS — O133 Gestational [pregnancy-induced] hypertension without significant proteinuria, third trimester: Secondary | ICD-10-CM

## 2021-08-28 DIAGNOSIS — Z20822 Contact with and (suspected) exposure to covid-19: Secondary | ICD-10-CM | POA: Diagnosis present

## 2021-08-28 DIAGNOSIS — N838 Other noninflammatory disorders of ovary, fallopian tube and broad ligament: Secondary | ICD-10-CM | POA: Diagnosis not present

## 2021-08-28 DIAGNOSIS — Z3A39 39 weeks gestation of pregnancy: Secondary | ICD-10-CM | POA: Diagnosis not present

## 2021-08-28 DIAGNOSIS — B951 Streptococcus, group B, as the cause of diseases classified elsewhere: Secondary | ICD-10-CM | POA: Diagnosis not present

## 2021-08-28 DIAGNOSIS — R1909 Other intra-abdominal and pelvic swelling, mass and lump: Secondary | ICD-10-CM | POA: Diagnosis present

## 2021-08-28 DIAGNOSIS — O98813 Other maternal infectious and parasitic diseases complicating pregnancy, third trimester: Secondary | ICD-10-CM | POA: Diagnosis not present

## 2021-08-28 LAB — CBC
HCT: 35.4 % — ABNORMAL LOW (ref 36.0–46.0)
Hemoglobin: 12.1 g/dL (ref 12.0–15.0)
MCH: 28.3 pg (ref 26.0–34.0)
MCHC: 34.2 g/dL (ref 30.0–36.0)
MCV: 82.7 fL (ref 80.0–100.0)
Platelets: 181 10*3/uL (ref 150–400)
RBC: 4.28 MIL/uL (ref 3.87–5.11)
RDW: 14.6 % (ref 11.5–15.5)
WBC: 8.9 10*3/uL (ref 4.0–10.5)
nRBC: 0 % (ref 0.0–0.2)

## 2021-08-28 LAB — TYPE AND SCREEN
ABO/RH(D): O POS
Antibody Screen: NEGATIVE

## 2021-08-28 LAB — ABO/RH: ABO/RH(D): O POS

## 2021-08-28 MED ORDER — OXYTOCIN-SODIUM CHLORIDE 30-0.9 UT/500ML-% IV SOLN
2.5000 [IU]/h | INTRAVENOUS | Status: DC
  Filled 2021-08-28: qty 1000

## 2021-08-28 MED ORDER — LACTATED RINGERS IV SOLN: 500.0000 mL | INTRAVENOUS | Status: DC | PRN

## 2021-08-28 MED ORDER — MISOPROSTOL 25 MCG QUARTER TABLET
50.0000 ug | ORAL_TABLET | ORAL | Status: DC | PRN
  Administered 2021-08-28: 50 ug via VAGINAL
  Filled 2021-08-28: qty 2
  Filled 2021-08-28 (×2): qty 1

## 2021-08-28 MED ORDER — TERBUTALINE SULFATE 1 MG/ML IJ SOLN: 0.2500 mg | Freq: Once | INTRAMUSCULAR | Status: DC | PRN

## 2021-08-28 MED ORDER — PHENYLEPHRINE 40 MCG/ML (10ML) SYRINGE FOR IV PUSH (FOR BLOOD PRESSURE SUPPORT)
80.0000 ug | PREFILLED_SYRINGE | INTRAVENOUS | Status: DC | PRN
  Filled 2021-08-28: qty 10

## 2021-08-28 MED ORDER — LACTATED RINGERS IV SOLN
500.0000 mL | Freq: Once | INTRAVENOUS | Status: AC
  Administered 2021-08-28: 500 mL via INTRAVENOUS

## 2021-08-28 MED ORDER — LIDOCAINE HCL (PF) 1 % IJ SOLN
INTRAMUSCULAR | Status: DC | PRN
  Administered 2021-08-28: 1 mL via SUBCUTANEOUS

## 2021-08-28 MED ORDER — SOD CITRATE-CITRIC ACID 500-334 MG/5ML PO SOLN: 30.0000 mL | ORAL | Status: DC | PRN

## 2021-08-28 MED ORDER — EPHEDRINE 5 MG/ML INJ
10.0000 mg | INTRAVENOUS | Status: DC | PRN
  Filled 2021-08-28: qty 2

## 2021-08-28 MED ORDER — FENTANYL-BUPIVACAINE-NACL 0.5-0.125-0.9 MG/250ML-% EP SOLN
EPIDURAL | Status: AC
  Filled 2021-08-28: qty 250

## 2021-08-28 MED ORDER — OXYCODONE-ACETAMINOPHEN 5-325 MG PO TABS
2.0000 | ORAL_TABLET | ORAL | Status: DC | PRN
Start: 2021-08-28 — End: 2021-08-29

## 2021-08-28 MED ORDER — OXYTOCIN BOLUS FROM INFUSION
333.0000 mL | Freq: Once | INTRAVENOUS | Status: AC
  Administered 2021-08-29: 333 mL via INTRAVENOUS

## 2021-08-28 MED ORDER — FENTANYL-BUPIVACAINE-NACL 0.5-0.125-0.9 MG/250ML-% EP SOLN
12.0000 mL/h | EPIDURAL | Status: DC | PRN
Start: 2021-08-28 — End: 2021-08-29
  Administered 2021-08-28: 12 mL/h via EPIDURAL

## 2021-08-28 MED ORDER — AMMONIA AROMATIC IN INHA
RESPIRATORY_TRACT | Status: AC
  Filled 2021-08-28: qty 10

## 2021-08-28 MED ORDER — DIPHENHYDRAMINE HCL 50 MG/ML IJ SOLN
12.5000 mg | INTRAMUSCULAR | Status: DC | PRN
Start: 2021-08-28 — End: 2021-08-29

## 2021-08-28 MED ORDER — BUTORPHANOL TARTRATE 1 MG/ML IJ SOLN: 1.0000 mg | INTRAMUSCULAR | Status: DC | PRN

## 2021-08-28 MED ORDER — ACETAMINOPHEN 325 MG PO TABS
650.0000 mg | ORAL_TABLET | ORAL | Status: DC | PRN
  Administered 2021-08-28: 650 mg via ORAL
  Filled 2021-08-28: qty 2

## 2021-08-28 MED ORDER — LIDOCAINE HCL (PF) 1 % IJ SOLN
INTRAMUSCULAR | Status: AC
  Administered 2021-08-29: 30 mL via SUBCUTANEOUS
  Filled 2021-08-28: qty 30

## 2021-08-28 MED ORDER — LIDOCAINE-EPINEPHRINE (PF) 1.5 %-1:200000 IJ SOLN
INTRAMUSCULAR | Status: DC | PRN
  Administered 2021-08-28: 3 mL via EPIDURAL

## 2021-08-28 MED ORDER — OXYCODONE-ACETAMINOPHEN 5-325 MG PO TABS
1.0000 | ORAL_TABLET | ORAL | Status: DC | PRN
Start: 2021-08-28 — End: 2021-08-29

## 2021-08-28 MED ORDER — LACTATED RINGERS IV SOLN
125.0000 mL/h | INTRAVENOUS | Status: DC
  Administered 2021-08-28 – 2021-08-29 (×2): 125 mL/h via INTRAVENOUS

## 2021-08-28 MED ORDER — LACTATED RINGERS IV BOLUS
1000.0000 mL | Freq: Once | INTRAVENOUS | Status: AC
  Administered 2021-08-28: 1000 mL via INTRAVENOUS

## 2021-08-28 MED ORDER — LIDOCAINE HCL (PF) 1 % IJ SOLN: 30.0000 mL | INTRAMUSCULAR | Status: AC | PRN

## 2021-08-28 MED ORDER — ONDANSETRON HCL 4 MG/2ML IJ SOLN: 4.0000 mg | Freq: Four times a day (QID) | INTRAMUSCULAR | Status: DC | PRN

## 2021-08-28 MED ORDER — SODIUM CHLORIDE 0.9 % IV SOLN
2.0000 g | Freq: Once | INTRAVENOUS | Status: AC
  Administered 2021-08-28: 2 g via INTRAVENOUS
  Filled 2021-08-28: qty 2000

## 2021-08-28 MED ORDER — SODIUM CHLORIDE 0.9 % IV SOLN
INTRAVENOUS | Status: DC | PRN
  Administered 2021-08-28 (×2): 5 mL via EPIDURAL

## 2021-08-28 MED ORDER — SODIUM CHLORIDE 0.9 % IV SOLN
1.0000 g | INTRAVENOUS | Status: DC
  Administered 2021-08-29 (×3): 1 g via INTRAVENOUS
  Filled 2021-08-28 (×8): qty 1000

## 2021-08-28 MED ORDER — OXYTOCIN 10 UNIT/ML IJ SOLN
INTRAMUSCULAR | Status: AC
  Filled 2021-08-28: qty 2

## 2021-08-28 MED ORDER — MISOPROSTOL 200 MCG PO TABS
ORAL_TABLET | ORAL | Status: AC
  Administered 2021-08-28: 50 ug via VAGINAL
  Filled 2021-08-28: qty 4

## 2021-08-28 NOTE — H&P (Signed)
History and Physical   HPI  Sara Richardson is a 30 y.o. G1P0000 at [redacted]w[redacted]d Estimated Date of Delivery: 08/31/21 who is being admitted for induction of labor for gestational hypertension.  Preg complicated by adnexal mass.   OB History  OB History  Gravida Para Term Preterm AB Living  1 0 0 0 0 0  SAB IAB Ectopic Multiple Live Births  0 0 0 0 0    # Outcome Date GA Lbr Len/2nd Weight Sex Delivery Anes PTL Lv  1 Current             PROBLEM LIST  Pregnancy complications or risks: Patient Active Problem List   Diagnosis Date Noted   Gestational hypertension 08/28/2021   Group B streptococcal carriage complicating pregnancy 97/12/6376   Adnexal mass 03/23/2021   Bee sting 02/20/2020   Lymphadenopathy 02/20/2020   Healthcare maintenance 12/11/2017   Back pain 12/11/2017   SVT (supraventricular tachycardia) ([place]) 12/01/2016   Anxiety 12/01/2016   Menstrual cramps 12/01/2016   Hx of prior ablation treatment 06/26/2013    Prenatal labs and studies: ABO, Rh: --/--/O POS Performed at [facility], [address] [place], [place] [address]  [phone] Antibody: NEG (10/18 1132) Rubella: 2.50 (04/12 1451) RPR: Non Reactive (08/03 1020)  HBsAg: Negative (04/12 1451)  HIV: Non Reactive (04/12 1451)  JOI:NOMVEHMC/-- (09/21 1339)   Past Medical History:  Diagnosis Date   Anxiety    SVT (supraventricular tachycardia) ([place])      Past Surgical History:  Procedure Laterality Date   ABLATION     NO PAST SURGERIES       Medications    Current Discharge Medication List     CONTINUE these medications which have NOT CHANGED   Details  Doxylamine-Pyridoxine 10-10 MG TBEC Take 10 mg by mouth daily. Qty: 30 tablet, Refills: 0    prenatal vitamin w/FE, FA (NATACHEW) 29-1 MG CHEW chewable tablet Chew 1 tablet by mouth daily at 12 noon.    Misc. Devices (MEDELA DOUBLE BREAST PUMP) MISC 1 Device by Other route daily. Qty: 1 each, Refills: 0   Comments:  Z39.1 Associated Diagnoses: Lactating mother         Allergies  Sulfamethoxazole-trimethoprim  Review of Systems  Pertinent items noted in HPI and remainder of comprehensive ROS otherwise negative.  Physical Exam  BP (!) 142/88 (BP Location: Left Arm)   Pulse 100   Temp 97.8 F (36.6 C) (Oral)   Resp 18   Ht 5\' 2"  (1.575 m)   Wt 83 kg   LMP 11/24/2020 (Exact Date)   BMI 33.47 kg/m   Lungs:  CTA B Cardio: RRR without M/R/G Abd: Soft, gravid, NT Presentation: cephalic EXT: No C/C/ 1+ Edema DTRs: 2+ B CERVIX: Dilation: 1 Effacement (%): 50 Cervical Position: Posterior, Middle Station: -3 Presentation: Vertex Exam by:: Jeannie Fend, MD  See Prenatal records for more detailed PE.   FHR:  Variability: Good {> 6 bpm)    Currently high fetal heart rate  Toco: Uterine Contractions: rare irreg  Test Results  Results for orders placed or performed during the hospital encounter of 08/28/21 (from the past 24 hour(s))  CBC     Status: Abnormal   Collection Time: 08/28/21 11:32 AM  Result Value Ref Range   WBC 8.9 4.0 - 10.5 K/uL   RBC 4.28 3.87 - 5.11 MIL/uL   Hemoglobin 12.1 12.0 - 15.0 g/dL   HCT 35.4 (L) 36.0 - 46.0 %  MCV 82.7 80.0 - 100.0 fL   MCH 28.3 26.0 - 34.0 pg   MCHC 34.2 30.0 - 36.0 g/dL   RDW 14.6 11.5 - 15.5 %   Platelets 181 150 - 400 K/uL   nRBC 0.0 0.0 - 0.2 %  Type and screen     Status: None   Collection Time: 08/28/21 11:32 AM  Result Value Ref Range   ABO/RH(D) O POS    Antibody Screen NEG    Sample Expiration      08/31/2021,2359 Performed at [facility], [address] [place], [place] [address]   ABO/Rh     Status: None   Collection Time: 08/28/21  1:50 PM  Result Value Ref Range   ABO/RH(D)      O POS Performed at [facility], [address] [place], [place] [address]    Group B Strep positive  Assessment   G1P0000 at [redacted]w[redacted]d Estimated Date of Delivery: 08/31/21  The fetus is reassuring.    Patient Active Problem List   Diagnosis Date Noted   Gestational hypertension 08/28/2021   Group B streptococcal carriage complicating pregnancy 40/34/7425   Adnexal mass 03/23/2021   Bee sting 02/20/2020   Lymphadenopathy 02/20/2020   Healthcare maintenance 12/11/2017   Back pain 12/11/2017   SVT (supraventricular tachycardia) ([place]) 12/01/2016   Anxiety 12/01/2016   Menstrual cramps 12/01/2016   Hx of prior ablation treatment 06/26/2013    Plan  1. Admit to L&D :   2. EFM: -- Category 1 3. Stadol or Epidural if desired.   4. Admission labs  5. Follow heart rate.  Membranes intact.  No maternal fever 6.  Fetal vertex low - expect rapid progress once cervix softens and contractions improve. 7.  ABX for GBS  Discussed management of all above with pt and family.   Finis Bud, M.D. 08/28/2021 4:21 PM

## 2021-08-28 NOTE — Anesthesia Procedure Notes (Signed)
Epidural Patient location during procedure: OB Start time: 08/28/2021 10:38 PM End time: 08/28/2021 10:43 PM  Staffing Anesthesiologist: Anylah Scheib, Precious Haws, MD Performed: anesthesiologist   Preanesthetic Checklist Completed: patient identified, IV checked, site marked, risks and benefits discussed, surgical consent, monitors and equipment checked, pre-op evaluation and timeout performed  Epidural Patient position: sitting Prep: ChloraPrep Patient monitoring: heart rate, continuous pulse ox and blood pressure Approach: midline Location: L3-L4 Injection technique: LOR saline  Needle:  Needle type: Tuohy  Needle gauge: 17 G Needle length: 9 cm and 9 Needle insertion depth: 7 cm Catheter type: closed end flexible Catheter size: 19 Gauge Catheter at skin depth: 12 cm Test dose: negative and 1.5% lidocaine with Epi 1:200 K  Assessment Sensory level: T10 Events: blood not aspirated, injection not painful, no injection resistance, no paresthesia and negative IV test  Additional Notes 1 attempt Pt. Evaluated and documentation done after procedure finished. Patient identified. Risks/Benefits/Options discussed with patient including but not limited to bleeding, infection, nerve damage, paralysis, failed block, incomplete pain control, headache, blood pressure changes, nausea, vomiting, reactions to medication both or allergic, itching and postpartum back pain. Confirmed with bedside nurse the patient's most recent platelet count. Confirmed with patient that they are not currently taking any anticoagulation, have any bleeding history or any family history of bleeding disorders. Patient expressed understanding and wished to proceed. All questions were answered. Sterile technique was used throughout the entire procedure. Please see nursing notes for vital signs. Test dose was given through epidural catheter and negative prior to continuing to dose epidural or start infusion. Warning signs of  high block given to the patient including shortness of breath, tingling/numbness in hands, complete motor block, or any concerning symptoms with instructions to call for help. Patient was given instructions on fall risk and not to get out of bed. All questions and concerns addressed with instructions to call with any issues or inadequate analgesia.    Patient tolerated the insertion well without immediate complications.Reason for block:procedure for pain

## 2021-08-28 NOTE — Anesthesia Preprocedure Evaluation (Signed)
Anesthesia Evaluation  Patient identified by MRN, date of birth, ID band Patient awake    Reviewed: Allergy & Precautions, NPO status , Patient's Chart, lab work & pertinent test results  History of Anesthesia Complications Negative for: history of anesthetic complications  Airway Mallampati: III  TM Distance: >3 FB Neck ROM: full    Dental  (+) Chipped   Pulmonary neg pulmonary ROS,    Pulmonary exam normal        Cardiovascular Exercise Tolerance: Good hypertension, + dysrhythmias Supra Ventricular Tachycardia      Neuro/Psych    GI/Hepatic negative GI ROS,   Endo/Other    Renal/GU   negative genitourinary   Musculoskeletal   Abdominal   Peds  Hematology negative hematology ROS (+)   Anesthesia Other Findings Past Medical History: No date: Anxiety No date: SVT (supraventricular tachycardia) ([facility])  Past Surgical History: No date: ABLATION No date: NO PAST SURGERIES  BMI    Body Mass Index: 33.47 kg/m      Reproductive/Obstetrics (+) Pregnancy                             Anesthesia Physical Anesthesia Plan  ASA: 3  Anesthesia Plan: Epidural   Post-op Pain Management:    Induction:   PONV Risk Score and Plan:   Airway Management Planned: Natural Airway  Additional Equipment:   Intra-op Plan:   Post-operative Plan:   Informed Consent: I have reviewed the patients History and Physical, chart, labs and discussed the procedure including the risks, benefits and alternatives for the proposed anesthesia with the patient or authorized representative who has indicated his/her understanding and acceptance.     Dental Advisory Given  Plan Discussed with: Anesthesiologist  Anesthesia Plan Comments: (Patient reports no bleeding problems and no anticoagulant use.   Patient consented for risks of anesthesia including but not limited to:  - adverse reactions to  medications - risk of bleeding, infection and or nerve damage from epidural that could lead to paralysis - risk of headache or failed epidural - nerve damage due to positioning - that if epidural is used for C-section that there is a chance of epidural failure requiring spinal placement or conversion to GA - Damage to heart, brain, lungs, other parts of body or loss of life  Patient voiced understanding.)        Anesthesia Quick Evaluation

## 2021-08-29 ENCOUNTER — Encounter: Payer: Self-pay | Admitting: Obstetrics and Gynecology

## 2021-08-29 DIAGNOSIS — B951 Streptococcus, group B, as the cause of diseases classified elsewhere: Secondary | ICD-10-CM

## 2021-08-29 DIAGNOSIS — N838 Other noninflammatory disorders of ovary, fallopian tube and broad ligament: Secondary | ICD-10-CM

## 2021-08-29 DIAGNOSIS — O133 Gestational [pregnancy-induced] hypertension without significant proteinuria, third trimester: Secondary | ICD-10-CM

## 2021-08-29 DIAGNOSIS — O98813 Other maternal infectious and parasitic diseases complicating pregnancy, third trimester: Secondary | ICD-10-CM

## 2021-08-29 DIAGNOSIS — Z3A39 39 weeks gestation of pregnancy: Secondary | ICD-10-CM

## 2021-08-29 DIAGNOSIS — O3483 Maternal care for other abnormalities of pelvic organs, third trimester: Secondary | ICD-10-CM

## 2021-08-29 LAB — RPR: RPR Ser Ql: NONREACTIVE

## 2021-08-29 MED ORDER — TERBUTALINE SULFATE 1 MG/ML IJ SOLN: 0.2500 mg | Freq: Once | INTRAMUSCULAR | Status: DC | PRN

## 2021-08-29 MED ORDER — OXYTOCIN-SODIUM CHLORIDE 30-0.9 UT/500ML-% IV SOLN: 2.5000 [IU]/h | INTRAVENOUS | Status: DC | PRN

## 2021-08-29 MED ORDER — OXYCODONE-ACETAMINOPHEN 5-325 MG PO TABS: 1.0000 | ORAL_TABLET | ORAL | Status: DC | PRN

## 2021-08-29 MED ORDER — SIMETHICONE 80 MG PO CHEW: 80.0000 mg | CHEWABLE_TABLET | ORAL | Status: DC | PRN

## 2021-08-29 MED ORDER — DOCUSATE SODIUM 100 MG PO CAPS
100.0000 mg | ORAL_CAPSULE | Freq: Two times a day (BID) | ORAL | Status: DC
  Administered 2021-08-29 – 2021-08-30 (×2): 100 mg via ORAL
  Filled 2021-08-29 (×2): qty 1

## 2021-08-29 MED ORDER — BENZOCAINE-MENTHOL 20-0.5 % EX AERO
1.0000 "application " | INHALATION_SPRAY | CUTANEOUS | Status: DC | PRN
  Filled 2021-08-29: qty 56

## 2021-08-29 MED ORDER — ACETAMINOPHEN 325 MG PO TABS: 650.0000 mg | ORAL_TABLET | ORAL | Status: DC | PRN

## 2021-08-29 MED ORDER — ZOLPIDEM TARTRATE 5 MG PO TABS: 5.0000 mg | ORAL_TABLET | Freq: Every evening | ORAL | Status: DC | PRN

## 2021-08-29 MED ORDER — OXYTOCIN-SODIUM CHLORIDE 30-0.9 UT/500ML-% IV SOLN
1.0000 m[IU]/min | INTRAVENOUS | Status: DC
  Administered 2021-08-29: 2 m[IU]/min via INTRAVENOUS

## 2021-08-29 MED ORDER — DIPHENHYDRAMINE HCL 25 MG PO CAPS: 25.0000 mg | ORAL_CAPSULE | Freq: Four times a day (QID) | ORAL | Status: DC | PRN

## 2021-08-29 MED ORDER — IBUPROFEN 600 MG PO TABS
600.0000 mg | ORAL_TABLET | Freq: Four times a day (QID) | ORAL | Status: DC
  Administered 2021-08-29 – 2021-08-30 (×4): 600 mg via ORAL
  Filled 2021-08-29 (×4): qty 1

## 2021-08-29 MED ORDER — PRENATAL MULTIVITAMIN CH
1.0000 | ORAL_TABLET | Freq: Every day | ORAL | Status: DC
  Administered 2021-08-30: 1 via ORAL
  Filled 2021-08-29: qty 1

## 2021-08-29 MED ORDER — TETANUS-DIPHTH-ACELL PERTUSSIS 5-2.5-18.5 LF-MCG/0.5 IM SUSY
0.5000 mL | PREFILLED_SYRINGE | Freq: Once | INTRAMUSCULAR | Status: DC
  Filled 2021-08-29: qty 0.5

## 2021-08-29 NOTE — Progress Notes (Signed)
LABOR NOTE   Sara Richardson 30 y.o.GP@ at [redacted]w[redacted]d  SUBJECTIVE:  Comfortable with epidural.  All questions answered. Analgesia: Epidural  OBJECTIVE:  BP 128/85 (BP Location: Left Arm)   Pulse 79   Temp 98.4 F (36.9 C) (Oral)   Resp 16   Ht 5\' 2"  (1.575 m)   Wt 83 kg   LMP 11/24/2020 (Exact Date)   SpO2 100%   BMI 33.47 kg/m  Total I/O In: -  Out: 725 [Urine:725]  She has shown cervical change. CERVIX:  (at 4AM SVE:   Dilation: 6.5 Effacement (%): 70 Station: -1 Exam by:: A Water engineer CONTRACTIONS: q2-5 min - now starting to space out and palpate mild-moderate FHR: Fetal heart tracing reviewed.   Category 1  Labs: Lab Results  Component Value Date   WBC 8.9 08/28/2021   HGB 12.1 08/28/2021   HCT 35.4 (L) 08/28/2021   MCV 82.7 08/28/2021   PLT 181 08/28/2021    ASSESSMENT: 1) Labor curve reviewed.       Progress: Active phase labor.     Membranes: ruptured, clear fluid  SROM 8PM     Contractions inadequate  Active Problems:   Gestational hypertension   PLAN: Begin low dose Pitocin Expect vaginal delivery  Finis Bud, M.D. 08/29/2021 7:42 AM

## 2021-08-30 MED ORDER — WITCH HAZEL-GLYCERIN EX PADS
MEDICATED_PAD | CUTANEOUS | Status: DC | PRN
  Filled 2021-08-30: qty 100

## 2021-08-30 MED ORDER — IBUPROFEN 600 MG PO TABS: 600.0000 mg | ORAL_TABLET | Freq: Four times a day (QID) | ORAL | 0 refills | Status: DC

## 2021-08-30 MED ORDER — DIBUCAINE (PERIANAL) 1 % EX OINT
TOPICAL_OINTMENT | CUTANEOUS | Status: DC | PRN
  Filled 2021-08-30: qty 28

## 2021-08-30 NOTE — Discharge Summary (Signed)
Patient Name: Sara Richardson DOB: 07-26-1991 MRN: [id number]                            Discharge Summary  Date of Admission: 08/28/2021 Date of Discharge: 08/30/2021 Delivering Provider: Harlin Heys   Admitting Diagnosis: Gestational hypertension [O13.9] at [redacted]w[redacted]d Secondary diagnosis:  Active Problems:   Gestational hypertension    Complex adnexal mass  Mode of Delivery: normal spontaneous vaginal delivery              Discharge diagnosis: Term Pregnancy Delivered and Gestational Hypertension      Intrapartum Procedures: epidural and laceration labial  repair   Post partum procedures:                       Discharge Day SOAP Note:  Progress Note - Vaginal Delivery  Sara Richardson is a 30 y.o. G1P1001 now PP day 1 s/p Vaginal, Spontaneous . Delivery was uncomplicated  Subjective  The patient has the following complaints: has no unusual complaints  Pain is controlled with current medications.   Patient is urinating without difficulty.  She is ambulating well.     Objective  Vital signs: BP 129/71 (BP Location: Right Arm)   Pulse 90   Temp (!) 97.5 F (36.4 C) (Oral)   Resp 18   Ht 5\' 2"  (1.575 m)   Wt 83 kg   LMP 11/24/2020 (Exact Date)   SpO2 98%   Breastfeeding Unknown   BMI 33.47 kg/m   Physical Exam: Gen: NAD Fundus Fundal Tone: Firm  Lochia Amount: Scant        Data Review Labs: Lab Results  Component Value Date   WBC 8.9 08/28/2021   HGB 12.1 08/28/2021   HCT 35.4 (L) 08/28/2021   MCV 82.7 08/28/2021   PLT 181 08/28/2021   CBC Latest Ref Rng & Units 08/28/2021 06/13/2021 05/17/2019  WBC 4.0 - 10.5 K/uL 8.9 11.4(H) 19.4(H)  Hemoglobin 12.0 - 15.0 g/dL 12.1 12.6 14.6  Hematocrit 36.0 - 46.0 % 35.4(L) 39.0 44.2  Platelets 150 - 400 K/uL 181 271 321   O POS Performed at [facility], [address] [place], [place] [address]   Flavia Shipper Score: Flavia Shipper Postnatal Depression Scale Screening Tool 08/29/2021  I have  been able to laugh and see the funny side of things. (No Data)    Assessment/Plan  Active Problems:   Gestational hypertension    Plan for discharge today.  Discharge Instructions: Per After Visit Summary. Activity: Advance as tolerated. Pelvic rest for 6 weeks.  Also refer to After Visit Summary Diet: Regular Medications: Allergies as of 08/30/2021       Reactions   Sulfamethoxazole-trimethoprim Hives, Itching        Medication List     STOP taking these medications    Doxylamine-Pyridoxine 10-10 MG Tbec   Medela Double Breast Pump Misc       TAKE these medications    ibuprofen 600 MG tablet Commonly known as: ADVIL Take 1 tablet (600 mg total) by mouth every 6 (six) hours.   prenatal vitamin w/FE, FA 29-1 MG Chew chewable tablet Chew 1 tablet by mouth daily at 12 noon.       Outpatient follow up:   Follow-up Information     Harlin Heys, MD Follow up in 2 week(s).   Specialties: Obstetrics and Gynecology, Radiology Contact information: [address]  [address] [place] [address] [phone]                Postpartum contraception: Will discuss at first office visit post-partum  Discharged Condition: good  Discharged to: home  Newborn Data: Disposition:home with mother  Apgars: APGAR (1 MIN): 8   APGAR (5 MINS): 9   APGAR (10 MINS):    Baby Feeding: Breast    Finis Bud, M.D. 08/30/2021 8:12 AM

## 2021-08-30 NOTE — Lactation Note (Signed)
This note was copied from a baby's chart. Lactation Consultation Note  Patient Name: Sara Richardson Date: 08/30/2021   Age:29 hours  Lactation follow-up prior to discharge.  LC discussed personal use DEBP based on parents questions: use, set-up, cleaning, and sizing of flanges. Signs given that flange is too small, when to move up, and use of coconut oil for lubrication. Discouraged use of pacifiers: masking hunger cues, different sucking pattern, different sensation in the mouth- can impact the establishment of breastfeeding.  Guidance given for anticipated breast changes, cluster feeding, and ongoing output expectations. Information for outpatient lactation services provided; encouraged to call with questions and for ongoing BF support.   [name] 08/30/2021, 3:35 PM

## 2021-08-30 NOTE — Progress Notes (Signed)
Patient discharged home with infant. Discharge instructions, prescriptions and follow up appointment given to and reviewed with patient. Patient verbalized understanding. Pt wheeled out with infant by auxiliary.  

## 2021-08-30 NOTE — Lactation Note (Signed)
This note was copied from a baby's chart. Lactation Consultation Note  Patient Name: Sara Richardson BHALP'F Date: 08/30/2021 Reason for consult: Initial assessment;Primapara;Term Age:29 hours  Initial lactation visit. Mom is P1, SVD 19 hours ago. Feedings and output documented since delivery.  RN and parents report period overnight baby wasn't interested in feeding and spitty; formula was asked for, but after receiving education parents decided to hold off.  LC provided reassurance that baby is doing well at the breast, pointed out output, baby's latch/transfer. Baby active at the breast in football hold while mom is standing, pointed out good latch, deep latch, and position.   Mom desires a transition to more pumping/bottle feeding than at the breast. Education given that baby at the breast for first 3-4 weeks helps to promote the onset of and establishment of a plentiful milk supply for baby. Briefly discussed a transitional plan for pumping with use of her personal use Medela pump.  Encouraged continue feedings on demands, onset of possible cluster feeding around 24 hours, and importance of feeding with early and each time baby cues.   Whiteboard updated with LC name/number, encouraged to call with questions today and for ongoing BF support.  Maternal Data Does the patient have breastfeeding experience prior to this delivery?: No  Feeding Mother's Current Feeding Choice: Breast Milk  LATCH Score Latch: Grasps breast easily, tongue down, lips flanged, rhythmical sucking.  Audible Swallowing: A few with stimulation  Type of Nipple: Everted at rest and after stimulation  Comfort (Breast/Nipple): Soft / non-tender  Hold (Positioning): No assistance needed to correctly position infant at breast.  LATCH Score: 9   Lactation Tools Discussed/Used    Interventions Interventions: Breast feeding basics reviewed;Education  Discharge Pump: Personal (Medela)  Consult  Status Consult Status: Follow-up Date: 08/30/21 Follow-up type: In-patient    [name] 08/30/2021, 10:20 AM

## 2021-08-30 NOTE — Anesthesia Postprocedure Evaluation (Signed)
Anesthesia Post Note  Patient: Niara L Townsel  Procedure(s) Performed: AN AD HOC LABOR EPIDURAL  Patient location during evaluation: Mother Baby Anesthesia Type: Epidural Level of consciousness: awake and alert Pain management: pain level controlled Vital Signs Assessment: post-procedure vital signs reviewed and stable Respiratory status: spontaneous breathing, nonlabored ventilation and respiratory function stable Cardiovascular status: stable Postop Assessment: no headache, no backache, patient able to bend at knees and able to ambulate Anesthetic complications: no   No notable events documented.   Last Vitals:  Vitals:   08/29/21 2334 08/30/21 0340  BP: 105/74 129/71  Pulse: 84 90  Resp: 20 18  Temp: 36.5 C (!) 36.4 C  SpO2: 98% 98%    Last Pain:  Vitals:   08/30/21 0340  TempSrc: Oral  PainSc:                  Precious Haws Annagrace Carr

## 2021-09-06 ENCOUNTER — Other Ambulatory Visit: Payer: Self-pay

## 2021-09-06 ENCOUNTER — Telehealth: Payer: Self-pay | Admitting: Obstetrics and Gynecology

## 2021-09-06 ENCOUNTER — Ambulatory Visit (INDEPENDENT_AMBULATORY_CARE_PROVIDER_SITE_OTHER): Payer: BC Managed Care – PPO | Admitting: Obstetrics and Gynecology

## 2021-09-06 DIAGNOSIS — R350 Frequency of micturition: Secondary | ICD-10-CM

## 2021-09-06 LAB — POCT URINALYSIS DIPSTICK OB
Bilirubin, UA: NEGATIVE
Glucose, UA: NEGATIVE
Ketones, UA: NEGATIVE
Nitrite, UA: NEGATIVE
POC,PROTEIN,UA: NEGATIVE
Spec Grav, UA: 1.01 (ref 1.010–1.025)
Urobilinogen, UA: 0.2 E.U./dL
pH, UA: 6.5 (ref 5.0–8.0)

## 2021-09-06 NOTE — Progress Notes (Signed)
Patient came in today and wanted to leave a urine sample she is having dysuria. She denies urinary frequency or urgency. Urine dip was positive for large blood and moderate leukocytes. Urine culture sent out.

## 2021-09-06 NOTE — Telephone Encounter (Signed)
Pt believes she has a possible UTI- burning when she urinates, pt is asking if she can do a uine drop off. Please Advise.

## 2021-09-08 ENCOUNTER — Telehealth: Payer: BC Managed Care – PPO | Admitting: Emergency Medicine

## 2021-09-08 DIAGNOSIS — R21 Rash and other nonspecific skin eruption: Secondary | ICD-10-CM

## 2021-09-08 NOTE — Progress Notes (Signed)
I am concerned that your rash may be a complication following child birth.  Please seek in person evaluation at [facility] hospital. If you do not have reliable transportation, please call 911.    Based on what you shared with me, I feel your condition warrants further evaluation as soon as possible at an Emergency department.    NOTE: There will be NO CHARGE for this eVisit   If you are having a true medical emergency please call 911.      Emergency [facility]  Get Driving Directions  [phone]  [address]  [place], [place] [address]  Open 24/7/365      [facility] Emergency Department at [facility]  [address]  [place], [place] [address]  Open 24/7/365    Emergency [facility]  Get Driving Directions  [phone]  [address], [place] [address]  Open 24/7/365      [facility] at [facility]  Get Driving Directions  [phone]  [address]  [place], [place] [address]  Open 24/7/365    [facility]  Emergency [place]  Get Driving Directions  [phone]  [place], [place] [address]  Open 24/7/365    [place]  Get Driving Directions  [address]  [place], [place] [address]  Open 24/7/365    [facility]  Emergency [facility]  Get Driving Directions  [phone]  [address]  [place], [place] [address]  Open 24/7/365

## 2021-09-10 ENCOUNTER — Telehealth: Payer: Self-pay

## 2021-09-10 NOTE — Telephone Encounter (Signed)
Please encourage to take Benadryl (she can use for 1-2 doses if breastfeeding, can use for longer if not breastfeeding).  Advise on ensuring she is not coming into contact with anything (use bland/fragrance free detergents and soaps). Can also use Aveeno (oatmeal lotion) for rash.

## 2021-09-10 NOTE — Telephone Encounter (Signed)
Patient called with concerns about a rash that is spotty, itchy and red. Onset of rash was the week after she delivered and it started on her lower abdomen, then she noticed it was on her feet and ankles. On Saturday she had spots on her breast. She has been treating symptoms with Hydrocortisone cream and Claritin with no improvement. She denies new medications, detergents and lotions. Please advise.

## 2021-09-11 LAB — URINE CULTURE

## 2021-09-18 ENCOUNTER — Encounter: Payer: Self-pay | Admitting: Obstetrics and Gynecology

## 2021-09-18 ENCOUNTER — Ambulatory Visit (INDEPENDENT_AMBULATORY_CARE_PROVIDER_SITE_OTHER): Payer: BC Managed Care – PPO | Admitting: Obstetrics and Gynecology

## 2021-09-18 ENCOUNTER — Other Ambulatory Visit: Payer: Self-pay

## 2021-09-18 DIAGNOSIS — R399 Unspecified symptoms and signs involving the genitourinary system: Secondary | ICD-10-CM

## 2021-09-18 DIAGNOSIS — N9489 Other specified conditions associated with female genital organs and menstrual cycle: Secondary | ICD-10-CM

## 2021-09-18 LAB — POCT URINALYSIS DIPSTICK
Bilirubin, UA: NEGATIVE
Glucose, UA: NEGATIVE
Ketones, UA: NEGATIVE
Nitrite, UA: NEGATIVE
Protein, UA: NEGATIVE
Spec Grav, UA: 1.01 (ref 1.010–1.025)
Urobilinogen, UA: 0.2 E.U./dL
pH, UA: 7 (ref 5.0–8.0)

## 2021-09-18 MED ORDER — NITROFURANTOIN MONOHYD MACRO 100 MG PO CAPS: 100.0000 mg | ORAL_CAPSULE | Freq: Two times a day (BID) | ORAL | 1 refills | Status: DC

## 2021-09-18 NOTE — Progress Notes (Signed)
Pt present for 2 week postpartum visit. Pt stated having pain and burning when she first starts to urinate. UA completed and documented. EPDS=5.

## 2021-09-18 NOTE — Progress Notes (Signed)
HPI:      Sara Richardson is a 30 y.o. G1P1001 who LMP was No LMP recorded.  Subjective:   She presents today she is approximately 2 weeks postpartum.  She reports she is doing well.  She is pumping and feeding exclusively breastmilk but is not breast-feeding very often. She does complain of some burning with urination and believes she may have a UTI. She is not having any symptoms from her previously diagnosed adnexal mass.    Hx: The following portions of the patient's history were reviewed and updated as appropriate:             She  has a past medical history of Anxiety and SVT (supraventricular tachycardia) ([place]). She does not have any pertinent problems on file. She  has a past surgical history that includes No past surgeries and Ablation. Her family history includes Breast cancer in her maternal grandmother; Heart Problems in her father; Hypertension in her mother; Prostate cancer in her maternal grandfather. She  reports that she has never smoked. She has never used smokeless tobacco. She reports that she does not currently use alcohol. She reports that she does not use drugs. She has a current medication list which includes the following prescription(s): ibuprofen, nitrofurantoin (macrocrystal-monohydrate), and prenatal vitamin w/fe, fa. She is allergic to sulfamethoxazole-trimethoprim.       Review of Systems:  Review of Systems  Constitutional: Denied constitutional symptoms, night sweats, recent illness, fatigue, fever, insomnia and weight loss.  Eyes: Denied eye symptoms, eye pain, photophobia, vision change and visual disturbance.  Ears/Nose/Throat/Neck: Denied ear, nose, throat or neck symptoms, hearing loss, nasal discharge, sinus congestion and sore throat.  Cardiovascular: Denied cardiovascular symptoms, arrhythmia, chest pain/pressure, edema, exercise intolerance, orthopnea and palpitations.  Respiratory: Denied pulmonary symptoms, asthma, pleuritic pain, productive  sputum, cough, dyspnea and wheezing.  Gastrointestinal: Denied, gastro-esophageal reflux, melena, nausea and vomiting.  Genitourinary: See HPI for additional information.  Musculoskeletal: Denied musculoskeletal symptoms, stiffness, swelling, muscle weakness and myalgia.  Dermatologic: Denied dermatology symptoms, rash and scar.  Neurologic: Denied neurology symptoms, dizziness, headache, neck pain and syncope.  Psychiatric: Denied psychiatric symptoms, anxiety and depression.  Endocrine: Denied endocrine symptoms including hot flashes and night sweats.   Meds:   Current Outpatient Medications on File Prior to Visit  Medication Sig Dispense Refill   ibuprofen (ADVIL) 600 MG tablet Take 1 tablet (600 mg total) by mouth every 6 (six) hours. 30 tablet 0   prenatal vitamin w/FE, FA (NATACHEW) 29-1 MG CHEW chewable tablet Chew 1 tablet by mouth daily at 12 noon.     No current facility-administered medications on file prior to visit.      Objective:     Vitals:   09/18/21 1419  BP: 122/84  Pulse: (!) 103   Filed Weights   09/18/21 1419  Weight: 163 lb 9.6 oz (74.2 kg)              UA may be consistent with UTI.          Assessment:    G1P1001 Patient Active Problem List   Diagnosis Date Noted   Gestational hypertension 08/28/2021   Group B streptococcal carriage complicating pregnancy 42/35/3614   Adnexal mass 03/23/2021   Bee sting 02/20/2020   Lymphadenopathy 02/20/2020   Healthcare maintenance 12/11/2017   Back pain 12/11/2017   SVT (supraventricular tachycardia) ([place]) 12/01/2016   Anxiety 12/01/2016   Menstrual cramps 12/01/2016   Hx of prior ablation treatment 06/26/2013  1. Postpartum care following vaginal delivery   2. UTI symptoms   3. Adnexal mass        Plan:            1.  We will treat with Macrobid because of symptoms and possible UTI.  2.  Recommend 6-week follow-up for adnexal mass with pelvic ultrasound.  3.  Follow-up in 4 weeks for  postpartum visit. Orders Orders Placed This Encounter  Procedures   US PELVIS TRANSVAGINAL NON-OB (TV ONLY)   US PELVIS (TRANSABDOMINAL ONLY)   POCT urinalysis dipstick     Meds ordered this encounter  Medications   nitrofurantoin, macrocrystal-monohydrate, (MACROBID) 100 MG capsule    Sig: Take 1 capsule (100 mg total) by mouth 2 (two) times daily.    Dispense:  14 capsule    Refill:  1      F/U  Return in about 4 weeks (around 10/16/2021).  Finis Bud, M.D. 09/18/2021 2:42 PM

## 2021-10-06 IMAGING — MR MR PELVIS W/O CM
13 of 15 series · 41 of 48 positions shown · non-contrast
Comparison: Sonogram from January 18, 2021 and report from Ob
sonogram from April 14, 2021 as well as selected images from this MFM
evaluation.

CLINICAL DATA: Suspected adnexal mass in a 29-year-old pregnant
female.

EXAM:
MRI PELVIS WITHOUT CONTRAST
TECHNIQUE: Multiplanar multisequence MR imaging of the pelvis was performed. No
intravenous contrast was administered. Study was not performed for
fetal evaluation.

[Series 3: T2 · coronal · 5.0mm · 1.56mm/px · 1 of 40 slices shown (1 of 5)]
[im 1/40]
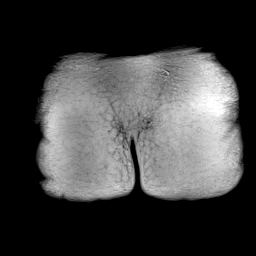

[Series 4: T2 · axial · 5.0mm · 0.84mm/px · z∈[-101,+223]mm · 2 of 55 slices shown (2 of 5)]
[im 1/55]
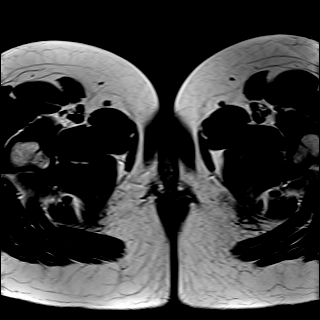
[im 55/55]
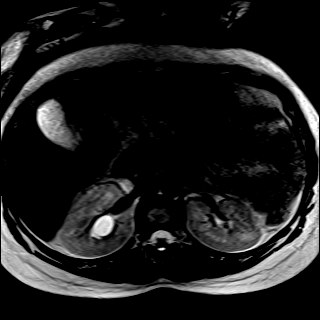

[Series 5: T2 · axial · 5.0mm · 0.84mm/px · z∈[-101,+223]mm · 3 of 55 slices shown (3 of 5)]
[im 1/55]
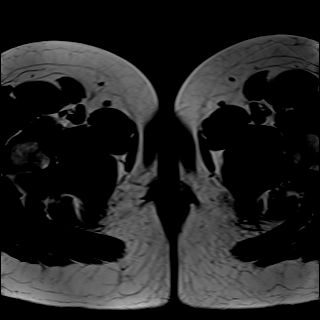
[im 28/55]
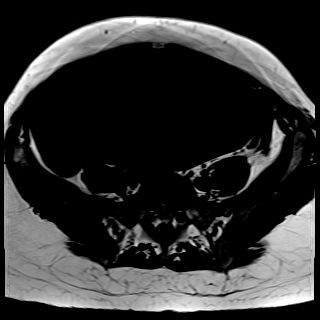
[im 55/55]
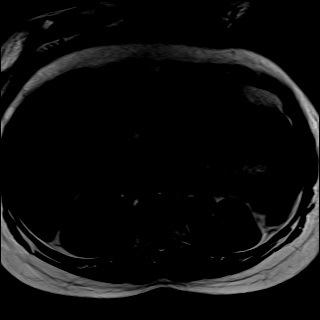

[Series 6: T2 · axial · 5.0mm · 0.84mm/px · z∈[-101,+223]mm · 3 of 55 slices shown (4 of 5)]
[im 1/55]
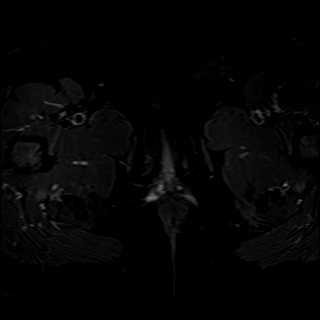
[im 28/55]
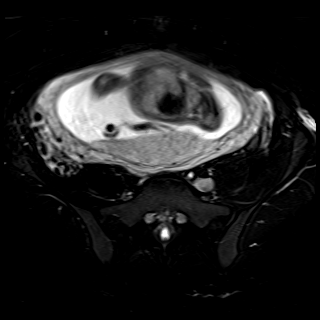
[im 55/55]
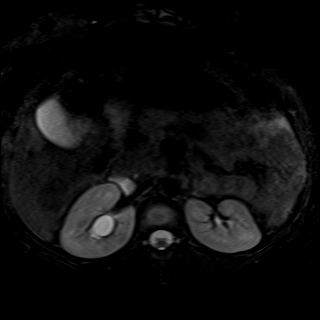

[Series 7: T2 fat-sat · axial · 4.0mm · 0.88mm/px · z∈[-74,+196]mm · 3 of 55 slices shown (1 of 3)]
[im 1/55]
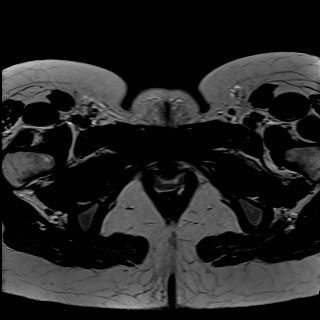
[im 28/55]
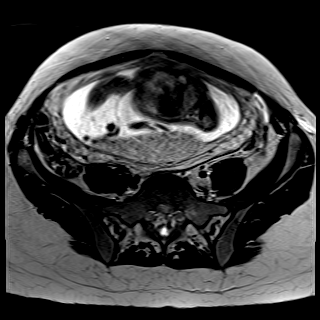
[im 55/55]
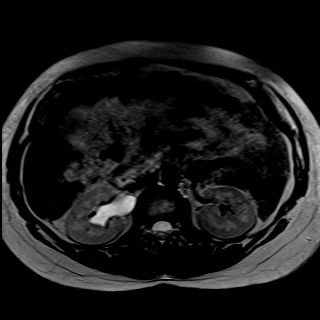

[Series 8: T2 fat-sat · axial · 4.0mm · 0.88mm/px · z∈[-74,+196]mm · 3 of 55 slices shown (2 of 3)]
[im 1/55]
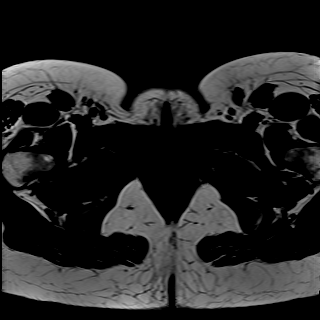
[im 28/55]
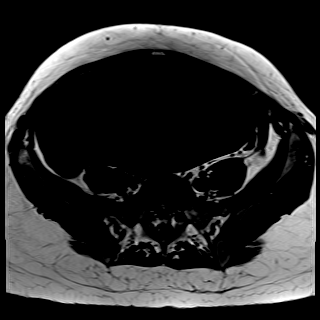
[im 55/55]
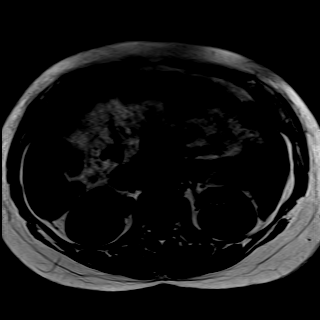

[Series 9: T2 fat-sat · axial · 4.0mm · 0.88mm/px · z∈[-74,+196]mm · 3 of 55 slices shown (3 of 3)]
[im 1/55]
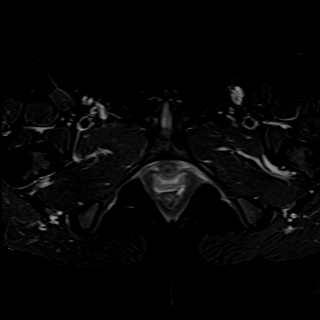
[im 28/55]
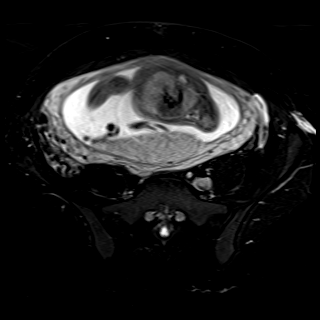
[im 55/55]
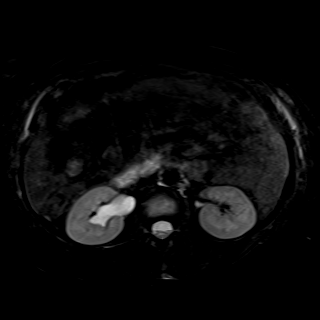

[Series 10: ax in&(date) · axial · 5.0mm · 0.55mm/px · z∈[-101,+223]mm · 3 of 55 slices shown (1 of 2)]
[im 1/55]
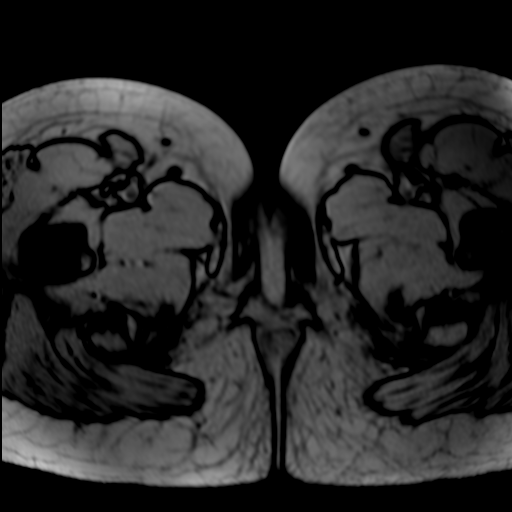
[im 28/55]
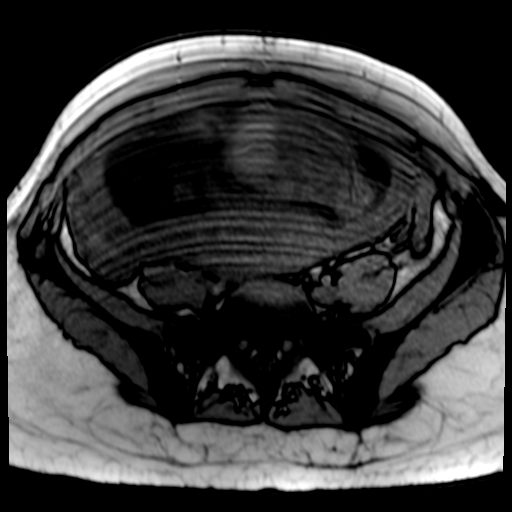
[im 55/55]
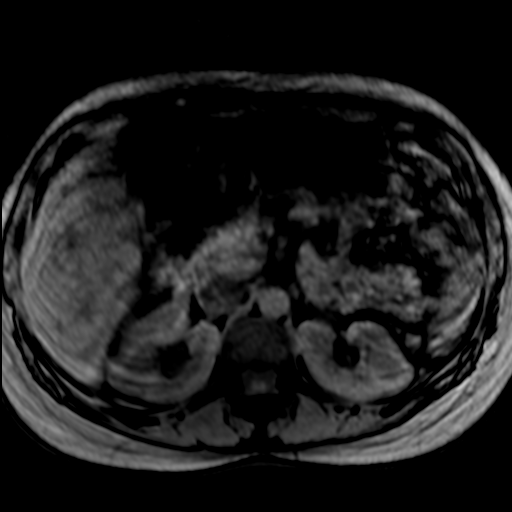

[Series 10: ax in&(date) · axial · 5.0mm · 0.55mm/px · z∈[-101,+223]mm · 3 of 55 slices shown (2 of 2)]
[im 1/55]
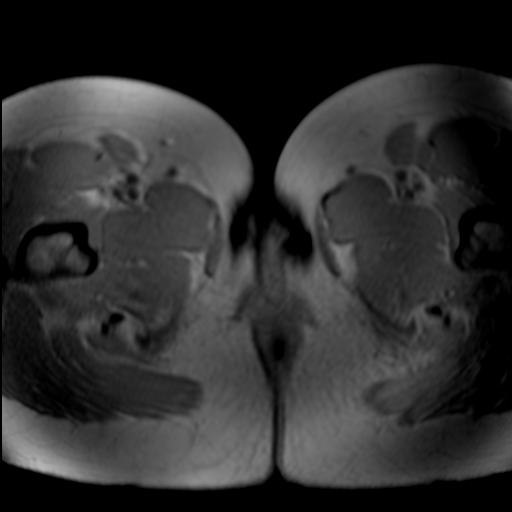
[im 28/55]
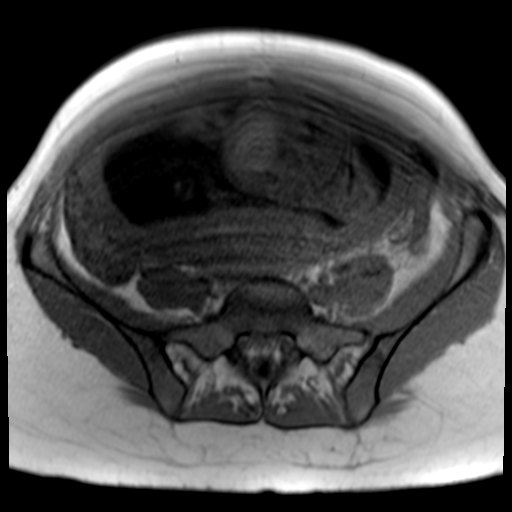
[im 55/55]
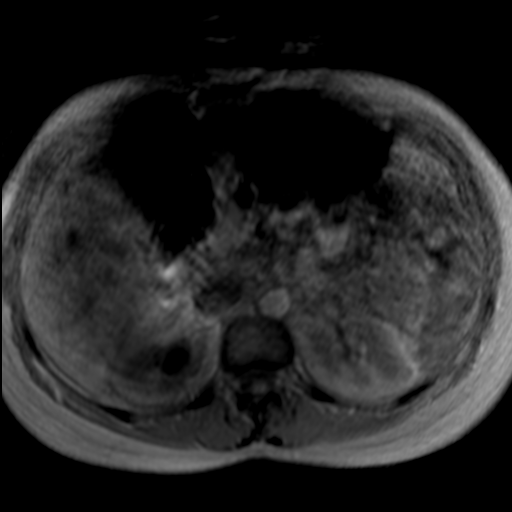

[Series 11: DWI · axial · 4.0mm · 1.14mm/px · z∈[-71,+194]mm · 8 of 162 slices shown]
[im 1/162]
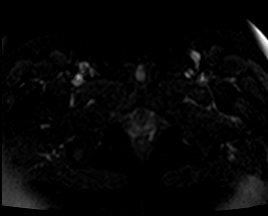
[im 24/162]
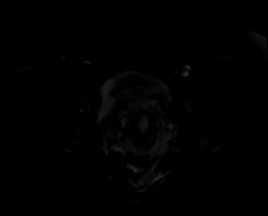
[im 47/162]
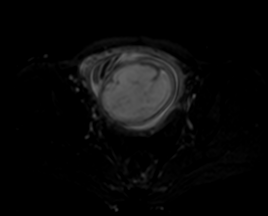
[im 70/162]
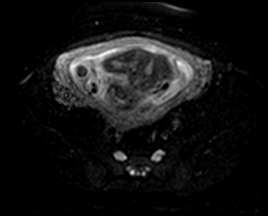
[im 93/162]
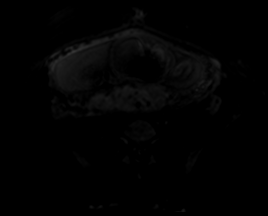
[im 116/162]
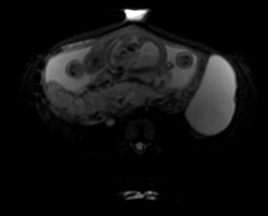
[im 139/162]
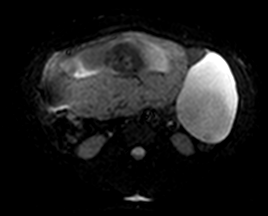
[im 162/162]
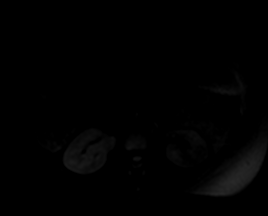

[Series 12: ax dwi_adc · axial · 4.0mm · 1.14mm/px · z∈[-71,+194]mm · 3 of 54 slices shown]
[im 1/54]
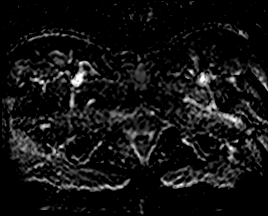
[im 27/54]
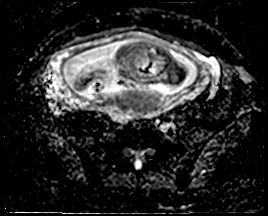
[im 54/54]
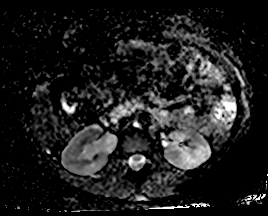

[Series 13: T2 · coronal · 5.0mm · 1.02mm/px · 2 of 30 slices shown (5 of 5)]
[im 1/30]
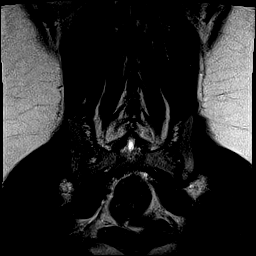
[im 30/30]
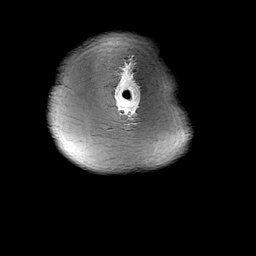

[Series 14: T1 fat-sat · axial · 3.0mm · 0.88mm/px · z∈[-81,+131]mm · 4 of 96 slices shown]
[im 1/96]
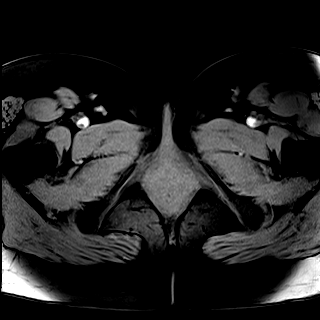
[im 24/96]
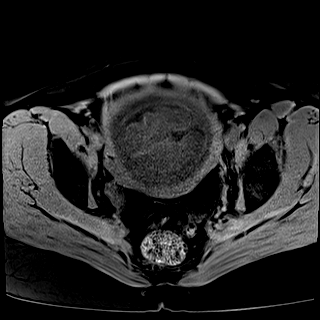
[im 48/96]
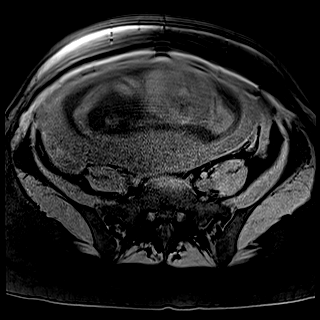
[im 72/96]
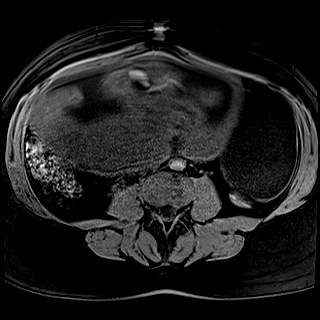

[41 of 48 positions shown; findings below may reference images not displayed]

FINDINGS: Urinary Tract: Mild RIGHT hydronephrosis and ureteral dilation in
the setting of gravid uterus with compression. No perinephric
stranding. No LEFT-sided ureteral or collecting system dilation.

Bowel: Visualized bowel displaced by gravid uterus. No acute
findings to the extent that bowel is evaluated

Vascular/Lymphatic: Vascular structures in the abdomen with normal
caliber. No adenopathy in the lower abdomen or in the pelvis.

Reproductive: 10 x 7 x 10 cm LEFT adnexal cystic lesion with thin
wall. No visible mural nodularity. Suspect a LEFT ovary is
immediately inferior to this area on image 24 of series 7 with
otherwise grossly unremarkable appearance accounting for limitations
of the study with large parametrial vessels. RIGHT ovary amidst
engorged parametrial vessels on image 26 of series 6.

Fetus in vertex position with posterior placenta with nuchal cord

Other:  Trace fluid in the LEFT pelvis.

Musculoskeletal: No suspicious bone lesions identified.
IMPRESSION: 1. 10 x 7 x 10 cm LEFT adnexal cystic lesion that arises from or
immediately adjacent to the LEFT ovary. Potentially large para
ovarian or ovarian cyst though persistent since Saturday January, 2021. Given
persistence and enlarged size low-grade cystic neoplasm is
considered though there are no signs of internal nodularity or
septation. Continued follow-up and particularly follow-up following
delivery may be helpful. No gross change it accounting for
comparison across modalities. Gyn consultation is suggested if not
yet performed.
2. Mild RIGHT hydroureteronephrosis in the setting of pregnancy
likely due to gravid uterus and compression. Correlate with any
RIGHT-sided symptoms with follow-up sonogram as warranted.
3.  LEFT-sided ureteral or collecting system dilation.
4. Fetus in vertex position with posterior placenta with potential
nuchal cord. Study not performed for fetal evaluation.

## 2021-10-15 ENCOUNTER — Other Ambulatory Visit: Payer: Self-pay

## 2021-10-15 ENCOUNTER — Encounter: Payer: Self-pay | Admitting: Obstetrics and Gynecology

## 2021-10-15 ENCOUNTER — Ambulatory Visit (INDEPENDENT_AMBULATORY_CARE_PROVIDER_SITE_OTHER): Payer: BC Managed Care – PPO | Admitting: Obstetrics and Gynecology

## 2021-10-15 DIAGNOSIS — L299 Pruritus, unspecified: Secondary | ICD-10-CM

## 2021-10-15 NOTE — Progress Notes (Signed)
HPI:      [name] is a 30 y.o. G1P1001 who LMP was No LMP recorded.  Subjective:   She presents today approximately 6 weeks postpartum.  She reports she is doing well.  She is pumping and is beginning to supplement as well.  She is using condoms for birth control and does not want to consider other forms at this time. She has a known pelvic mass which is complex.  She is not experiencing any problems from it. She does complain of occasional deep pelvic pain but it seems to come and go and seems to be more in the vagina than the abdomen. Over the last week, she is also experiencing some whole body itching that seems to be associated with the punctate rash.  She says that Claritin gives her some relief but it is not lasting all day for her.  She reports that she has considered all changes in lotions shampoos soaps laundry detergents etc. can think of nothing that is changed.    Hx: The following portions of the patient's history were reviewed and updated as appropriate:             She  has a past medical history of Anxiety and SVT (supraventricular tachycardia) ([place]). She does not have any pertinent problems on file. She  has a past surgical history that includes No past surgeries and Ablation. Her family history includes Breast cancer in her maternal grandmother; Heart Problems in her father; Hypertension in her mother; Prostate cancer in her maternal grandfather. She  reports that she has never smoked. She has never used smokeless tobacco. She reports that she does not currently use alcohol. She reports that she does not use drugs. She has a current medication list which includes the following prescription(s): prenatal vitamin w/fe, fa, ibuprofen, and nitrofurantoin (macrocrystal-monohydrate). She is allergic to sulfamethoxazole-trimethoprim.       Review of Systems:  Review of Systems  Constitutional: Denied constitutional symptoms, night sweats, recent illness, fatigue, fever,  insomnia and weight loss.  Eyes: Denied eye symptoms, eye pain, photophobia, vision change and visual disturbance.  Ears/Nose/Throat/Neck: Denied ear, nose, throat or neck symptoms, hearing loss, nasal discharge, sinus congestion and sore throat.  Cardiovascular: Denied cardiovascular symptoms, arrhythmia, chest pain/pressure, edema, exercise intolerance, orthopnea and palpitations.  Respiratory: Denied pulmonary symptoms, asthma, pleuritic pain, productive sputum, cough, dyspnea and wheezing.  Gastrointestinal: Denied, gastro-esophageal reflux, melena, nausea and vomiting.  Genitourinary: Denied genitourinary symptoms including symptomatic vaginal discharge, pelvic relaxation issues, and urinary complaints.  Musculoskeletal: Denied musculoskeletal symptoms, stiffness, swelling, muscle weakness and myalgia.  Dermatologic: Denied dermatology symptoms, rash and scar.  Neurologic: Denied neurology symptoms, dizziness, headache, neck pain and syncope.  Psychiatric: Denied psychiatric symptoms, anxiety and depression.  Endocrine: Denied endocrine symptoms including hot flashes and night sweats.   Meds:   Current Outpatient Medications on File Prior to Visit  Medication Sig Dispense Refill   prenatal vitamin w/FE, FA (NATACHEW) 29-1 MG CHEW chewable tablet Chew 1 tablet by mouth daily at 12 noon.     ibuprofen (ADVIL) 600 MG tablet Take 1 tablet (600 mg total) by mouth every 6 (six) hours. (Patient not taking: Reported on 10/15/2021) 30 tablet 0   nitrofurantoin, macrocrystal-monohydrate, (MACROBID) 100 MG capsule Take 1 capsule (100 mg total) by mouth 2 (two) times daily. (Patient not taking: Reported on 10/15/2021) 14 capsule 1   No current facility-administered medications on file prior to visit.      Objective:  Vitals:   10/15/21 0947  BP: 124/83   Filed Weights   10/15/21 0947  Weight: 159 lb (72.1 kg)              Pelvic examination   Pelvic:   Vulva: Normal appearance.  No  lesions.  No abnormal scarring.    Vagina: No lesions or abnormalities noted.  Support: Normal pelvic support.  Urethra No masses tenderness or scarring.  Meatus Normal size without lesions or prolapse.  Cervix: Normal ectropion.  No lesions.  Anus: Normal exam.  No lesions.  Perineum: Normal exam.  No lesions.  Healed well.          Bimanual   Uterus: Normal size.  Non-tender.  Mobile.  AV.  Adnexae: No masses.  Non-tender to palpation.  Cul-de-sac: Negative for abnormality.             Assessment:    G1P1001 Patient Active Problem List   Diagnosis Date Noted   Gestational hypertension 08/28/2021   Group B streptococcal carriage complicating pregnancy 58/83/2549   Adnexal mass 03/23/2021   Bee sting 02/20/2020   Lymphadenopathy 02/20/2020   Healthcare maintenance 12/11/2017   Back pain 12/11/2017   SVT (supraventricular tachycardia) ([place]) 12/01/2016   Anxiety 12/01/2016   Menstrual cramps 12/01/2016   Hx of prior ablation treatment 06/26/2013     1. Postpartum care and examination immediately after delivery   2. Itching     Patient doing well postpartum.     Plan:     1.  May resume normal activities with exception of heavy lifting. 2.  2-week course of Atarax for itching.  If itching continues consider further work-up possibly including bile acid salts. 3.  Ultrasound as scheduled for follow-up of complex pelvic mass. Orders No orders of the defined types were placed in this encounter.   No orders of the defined types were placed in this encounter.     F/U  Return for We will contact her with any abnormal test results.  Finis Bud, M.D. 10/15/2021 10:23 AM

## 2021-10-18 MED ORDER — HYDROXYZINE HCL 25 MG PO TABS: 25.0000 mg | ORAL_TABLET | Freq: Three times a day (TID) | ORAL | 0 refills | Status: DC | PRN

## 2021-10-24 ENCOUNTER — Ambulatory Visit (INDEPENDENT_AMBULATORY_CARE_PROVIDER_SITE_OTHER): Payer: BC Managed Care – PPO

## 2021-10-24 ENCOUNTER — Other Ambulatory Visit: Payer: Self-pay

## 2021-10-24 DIAGNOSIS — N9489 Other specified conditions associated with female genital organs and menstrual cycle: Secondary | ICD-10-CM

## 2021-10-24 NOTE — Progress Notes (Signed)
Crystal please schedule her in the near future for follow-up of her pelvic ultrasound so we can discuss it and decide upon surgery if necessary.

## 2021-10-30 ENCOUNTER — Other Ambulatory Visit: Payer: BC Managed Care – PPO

## 2021-10-30 ENCOUNTER — Ambulatory Visit (INDEPENDENT_AMBULATORY_CARE_PROVIDER_SITE_OTHER): Payer: BC Managed Care – PPO | Admitting: Obstetrics and Gynecology

## 2021-10-30 ENCOUNTER — Encounter: Payer: Self-pay | Admitting: Obstetrics and Gynecology

## 2021-10-30 ENCOUNTER — Other Ambulatory Visit: Payer: Self-pay

## 2021-10-30 VITALS — BP 114/80 | HR 91 | Ht 62.0 in | Wt 160.5 lb

## 2021-10-30 DIAGNOSIS — R1900 Intra-abdominal and pelvic swelling, mass and lump, unspecified site: Secondary | ICD-10-CM

## 2021-10-30 NOTE — Progress Notes (Signed)
Patient presents for pre-op appointment for upcoming cyst removal. Patient states no concerns at this time.

## 2021-10-30 NOTE — Progress Notes (Signed)
HPI:      [name] is a 30 y.o. G1P1001 who LMP was Patient's last menstrual period was 10/16/2021 (exact date).  Subjective:   She presents today initially thought to be for preop.  She was considering having surgery before the new year.   She has a large left ovarian cyst which appears simple.  Based on MRI and ultrasound that has changed very little in size.  Based on appearance it has a low risk of malignancy. [name] believes that she is relatively asymptomatic with this cyst.  It does not affect her life in any dramatic way.    Hx: The following portions of the patient's history were reviewed and updated as appropriate:             She  has a past medical history of Anxiety and SVT (supraventricular tachycardia) ([place]). She does not have any pertinent problems on file. She  has a past surgical history that includes No past surgeries and Ablation. Her family history includes Breast cancer in her maternal grandmother; Heart Problems in her father; Hypertension in her mother; Prostate cancer in her maternal grandfather. She  reports that she has never smoked. She has never used smokeless tobacco. She reports that she does not currently use alcohol. She reports that she does not use drugs. She has a current medication list which includes the following prescription(s): prenatal vitamin w/fe, fa. She is allergic to sulfamethoxazole-trimethoprim.       Review of Systems:  Review of Systems  Constitutional: Denied constitutional symptoms, night sweats, recent illness, fatigue, fever, insomnia and weight loss.  Eyes: Denied eye symptoms, eye pain, photophobia, vision change and visual disturbance.  Ears/Nose/Throat/Neck: Denied ear, nose, throat or neck symptoms, hearing loss, nasal discharge, sinus congestion and sore throat.  Cardiovascular: Denied cardiovascular symptoms, arrhythmia, chest pain/pressure, edema, exercise intolerance, orthopnea and palpitations.  Respiratory: Denied  pulmonary symptoms, asthma, pleuritic pain, productive sputum, cough, dyspnea and wheezing.  Gastrointestinal: Denied, gastro-esophageal reflux, melena, nausea and vomiting.  Genitourinary: See HPI for additional information.  Musculoskeletal: Denied musculoskeletal symptoms, stiffness, swelling, muscle weakness and myalgia.  Dermatologic: Denied dermatology symptoms, rash and scar.  Neurologic: Denied neurology symptoms, dizziness, headache, neck pain and syncope.  Psychiatric: Denied psychiatric symptoms, anxiety and depression.  Endocrine: Denied endocrine symptoms including hot flashes and night sweats.   Meds:   Current Outpatient Medications on File Prior to Visit  Medication Sig Dispense Refill   prenatal vitamin w/FE, FA (NATACHEW) 29-1 MG CHEW chewable tablet Chew 1 tablet by mouth daily at 12 noon.     No current facility-administered medications on file prior to visit.      Objective:     Vitals:   10/30/21 1509  BP: 114/80  Pulse: 91   Filed Weights   10/30/21 1509  Weight: 160 lb 8 oz (72.8 kg)                        Assessment:    G1P1001 Patient Active Problem List   Diagnosis Date Noted   Gestational hypertension 08/28/2021   Group B streptococcal carriage complicating pregnancy 02/77/4128   Adnexal mass 03/23/2021   Bee sting 02/20/2020   Lymphadenopathy 02/20/2020   Healthcare maintenance 12/11/2017   Back pain 12/11/2017   SVT (supraventricular tachycardia) ([place]) 12/01/2016   Anxiety 12/01/2016   Menstrual cramps 12/01/2016   Hx of prior ablation treatment 06/26/2013     1. Pelvic mass  Ultrasound and MRI results reviewed.  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   Plan:            1.  I have discussed the risk benefits of surgery in detail with the patient.  Specific risks of possible oophorectomy at the time of surgery discussed.  The goal will be to perform a cystectomy  and save the remaining portion of the ovary.  After discussing the risks of surgery Sara Richardson could not come to a decision whether she wanted the surgery now or want to wait for the possibility of having the surgery in the future.  We discussed the possibility of getting a ROMA score but also that this would be something that would occur in the future for knowledge and not be effective for decision-making in this calendar year.  After answering all of her questions and having a long discussion as well as giving them a significant time to talk together a decision could not be reached. We will therefore plan to delay her surgery until she is ready -likely sometime in 2023.  Orders No orders of the defined types were placed in this encounter.   No orders of the defined types were placed in this encounter.     F/U  No follow-ups on file. I spent 46 minutes involved in the care of this patient preparing to see the patient by obtaining and reviewing her medical history (including labs, imaging tests and prior procedures), documenting clinical information in the electronic health record (EHR), counseling and coordinating care plans, writing and sending prescriptions, ordering tests or procedures and in direct communicating with the patient and medical staff discussing pertinent items from her history and physical exam.  Finis Bud, M.D. 10/30/2021 4:10 PM

## 2021-11-01 ENCOUNTER — Ambulatory Visit (INDEPENDENT_AMBULATORY_CARE_PROVIDER_SITE_OTHER): Payer: BC Managed Care – PPO | Admitting: Obstetrics and Gynecology

## 2021-11-01 ENCOUNTER — Encounter: Payer: Self-pay | Admitting: Obstetrics and Gynecology

## 2021-11-01 ENCOUNTER — Other Ambulatory Visit: Payer: Self-pay

## 2021-11-01 VITALS — BP 116/79 | HR 81 | Ht 62.0 in | Wt 153.8 lb

## 2021-11-01 DIAGNOSIS — Z01818 Encounter for other preprocedural examination: Secondary | ICD-10-CM

## 2021-11-01 DIAGNOSIS — R1900 Intra-abdominal and pelvic swelling, mass and lump, unspecified site: Secondary | ICD-10-CM

## 2021-11-01 NOTE — H&P (Signed)
PRE-OPERATIVE HISTORY AND PHYSICAL EXAM   Subjective:   HPI:  Sara Richardson is a 30 y.o. G1P1001.  Patient's last menstrual period was 10/16/2021 (exact date).  She presents today for a pre-op discussion and PE.  She has the following symptoms: She is asymptomatic but has a large left ovarian cyst which has persisted for several months.  Review of Systems:   Constitutional: Denied constitutional symptoms, night sweats, recent illness, fatigue, fever, insomnia and weight loss.  Eyes: Denied eye symptoms, eye pain, photophobia, vision change and visual disturbance.  Ears/Nose/Throat/Neck: Denied ear, nose, throat or neck symptoms, hearing loss, nasal discharge, sinus congestion and sore throat.  Cardiovascular: Denied cardiovascular symptoms, arrhythmia, chest pain/pressure, edema, exercise intolerance, orthopnea and palpitations.  Respiratory: Denied pulmonary symptoms, asthma, pleuritic pain, productive sputum, cough, dyspnea and wheezing.  Gastrointestinal: Denied, gastro-esophageal reflux, melena, nausea and vomiting.  Genitourinary: Denied genitourinary symptoms including symptomatic vaginal discharge, pelvic relaxation issues, and urinary complaints.  Musculoskeletal: Denied musculoskeletal symptoms, stiffness, swelling, muscle weakness and myalgia.  Dermatologic: Denied dermatology symptoms, rash and scar.  Neurologic: Denied neurology symptoms, dizziness, headache, neck pain and syncope.  Psychiatric: Denied psychiatric symptoms, anxiety and depression.  Endocrine: Denied endocrine symptoms including hot flashes and night sweats.   OB History  Gravida Para Term Preterm AB Living  1 1 1  0 0 1  SAB IAB Ectopic Multiple Live Births  0 0 0 0 1    # Outcome Date GA Lbr Len/2nd Weight Sex Delivery Anes PTL Lv  1 Term 08/29/21 [redacted]w[redacted]d / 01:56 7 lb 7.6 oz (3.39 kg) M Vag-Spont EPI, Local  LIV    Past Medical History:  Diagnosis Date   Anxiety    SVT (supraventricular  tachycardia) (HCC)     Past Surgical History:  Procedure Laterality Date   ABLATION     NO PAST SURGERIES        SOCIAL HISTORY:  Social History   Tobacco Use  Smoking Status Never  Smokeless Tobacco Never   Social History   Substance and Sexual Activity  Alcohol Use Not Currently    Social History   Substance and Sexual Activity  Drug Use No    Family History  Problem Relation Age of Onset   Breast cancer Maternal Grandmother        lung cancer    Prostate cancer Maternal Grandfather    Hypertension Mother    Heart Problems Father     ALLERGIES:  Sulfamethoxazole-trimethoprim  MEDS:   Current Outpatient Medications on File Prior to Visit  Medication Sig Dispense Refill   prenatal vitamin w/FE, FA (NATACHEW) 29-1 MG CHEW chewable tablet Chew 1 tablet by mouth daily at 12 noon.     No current facility-administered medications on file prior to visit.    No orders of the defined types were placed in this encounter.    Physical examination BP 116/79    Pulse 81    Ht 5\' 2"  (1.575 m)    Wt 153 lb 12.8 oz (69.8 kg)    LMP 10/16/2021 (Exact Date)    Breastfeeding Yes    BMI 28.13 kg/m   General NAD, Conversant  HEENT Atraumatic; Op clear with mmm.  Normo-cephalic. Pupils reactive. Anicteric sclerae  Thyroid/Neck Smooth without nodularity or enlargement. Normal ROM.  Neck Supple.  Skin No rashes, lesions or ulceration. Normal palpated skin turgor. No nodularity.  Breasts: No masses or discharge.  Symmetric.  No axillary adenopathy.  Lungs: Clear  to auscultation.No rales or wheezes. Normal Respiratory effort, no retractions.  Heart: NSR.  No murmurs or rubs appreciated. No periferal edema  Abdomen: Soft.  Non-tender.  No masses.  No HSM. No hernia  Extremities: Moves all appropriately.  Normal ROM for age. No lymphadenopathy.  Neuro: Oriented to PPT.  Normal mood. Normal affect.     Pelvic: Patient had recent vaginal birth and accompanying pelvic exam.      Please see ultrasound report for further delineation of pelvis.    Other pelvic exam deferred to the OR.  Assessment:   G1P1001 Patient Active Problem List   Diagnosis Date Noted   Gestational hypertension 08/28/2021   Group B streptococcal carriage complicating pregnancy 93/23/5573   Adnexal mass 03/23/2021   Bee sting 02/20/2020   Lymphadenopathy 02/20/2020   Healthcare maintenance 12/11/2017   Back pain 12/11/2017   SVT (supraventricular tachycardia) ([place]) 12/01/2016   Anxiety 12/01/2016   Menstrual cramps 12/01/2016   Hx of prior ablation treatment 06/26/2013    1. Preop examination   2. Pelvic mass    Persistent pelvic mass. ?  Serous cystadenoma   Plan:   Orders: No orders of the defined types were placed in this encounter.    1.  Laparoscopic left ovarian cystectomy-possible oophorectomy

## 2021-11-01 NOTE — H&P (View-Only) (Signed)
PRE-OPERATIVE HISTORY AND PHYSICAL EXAM   Subjective:   HPI:  Sara Richardson is a 30 y.o. G1P1001.  Patient's last menstrual period was 10/16/2021 (exact date).  She presents today for a pre-op discussion and PE.  She has the following symptoms: She is asymptomatic but has a large left ovarian cyst which has persisted for several months.  Review of Systems:   Constitutional: Denied constitutional symptoms, night sweats, recent illness, fatigue, fever, insomnia and weight loss.  Eyes: Denied eye symptoms, eye pain, photophobia, vision change and visual disturbance.  Ears/Nose/Throat/Neck: Denied ear, nose, throat or neck symptoms, hearing loss, nasal discharge, sinus congestion and sore throat.  Cardiovascular: Denied cardiovascular symptoms, arrhythmia, chest pain/pressure, edema, exercise intolerance, orthopnea and palpitations.  Respiratory: Denied pulmonary symptoms, asthma, pleuritic pain, productive sputum, cough, dyspnea and wheezing.  Gastrointestinal: Denied, gastro-esophageal reflux, melena, nausea and vomiting.  Genitourinary: Denied genitourinary symptoms including symptomatic vaginal discharge, pelvic relaxation issues, and urinary complaints.  Musculoskeletal: Denied musculoskeletal symptoms, stiffness, swelling, muscle weakness and myalgia.  Dermatologic: Denied dermatology symptoms, rash and scar.  Neurologic: Denied neurology symptoms, dizziness, headache, neck pain and syncope.  Psychiatric: Denied psychiatric symptoms, anxiety and depression.  Endocrine: Denied endocrine symptoms including hot flashes and night sweats.   OB History  Gravida Para Term Preterm AB Living  1 1 1  0 0 1  SAB IAB Ectopic Multiple Live Births  0 0 0 0 1    # Outcome Date GA Lbr Len/2nd Weight Sex Delivery Anes PTL Lv  1 Term 08/29/21 [redacted]w[redacted]d / 01:56 7 lb 7.6 oz (3.39 kg) M Vag-Spont EPI, Local  LIV    Past Medical History:  Diagnosis Date   Anxiety    SVT (supraventricular  tachycardia) (HCC)     Past Surgical History:  Procedure Laterality Date   ABLATION     NO PAST SURGERIES        SOCIAL HISTORY:  Social History   Tobacco Use  Smoking Status Never  Smokeless Tobacco Never   Social History   Substance and Sexual Activity  Alcohol Use Not Currently    Social History   Substance and Sexual Activity  Drug Use No    Family History  Problem Relation Age of Onset   Breast cancer Maternal Grandmother        lung cancer    Prostate cancer Maternal Grandfather    Hypertension Mother    Heart Problems Father     ALLERGIES:  Sulfamethoxazole-trimethoprim  MEDS:   Current Outpatient Medications on File Prior to Visit  Medication Sig Dispense Refill   prenatal vitamin w/FE, FA (NATACHEW) 29-1 MG CHEW chewable tablet Chew 1 tablet by mouth daily at 12 noon.     No current facility-administered medications on file prior to visit.    No orders of the defined types were placed in this encounter.    Physical examination BP 116/79    Pulse 81    Ht 5\' 2"  (1.575 m)    Wt 153 lb 12.8 oz (69.8 kg)    LMP 10/16/2021 (Exact Date)    Breastfeeding Yes    BMI 28.13 kg/m   General NAD, Conversant  HEENT Atraumatic; Op clear with mmm.  Normo-cephalic. Pupils reactive. Anicteric sclerae  Thyroid/Neck Smooth without nodularity or enlargement. Normal ROM.  Neck Supple.  Skin No rashes, lesions or ulceration. Normal palpated skin turgor. No nodularity.  Breasts: No masses or discharge.  Symmetric.  No axillary adenopathy.  Lungs: Clear  to auscultation.No rales or wheezes. Normal Respiratory effort, no retractions.  Heart: NSR.  No murmurs or rubs appreciated. No periferal edema  Abdomen: Soft.  Non-tender.  No masses.  No HSM. No hernia  Extremities: Moves all appropriately.  Normal ROM for age. No lymphadenopathy.  Neuro: Oriented to PPT.  Normal mood. Normal affect.     Pelvic: Patient had recent vaginal birth and accompanying pelvic exam.      Please see ultrasound report for further delineation of pelvis.    Other pelvic exam deferred to the OR.  Assessment:   G1P1001 Patient Active Problem List   Diagnosis Date Noted   Gestational hypertension 08/28/2021   Group B streptococcal carriage complicating pregnancy 56/21/3086   Adnexal mass 03/23/2021   Bee sting 02/20/2020   Lymphadenopathy 02/20/2020   Healthcare maintenance 12/11/2017   Back pain 12/11/2017   SVT (supraventricular tachycardia) ([place]) 12/01/2016   Anxiety 12/01/2016   Menstrual cramps 12/01/2016   Hx of prior ablation treatment 06/26/2013    1. Preop examination   2. Pelvic mass    Persistent pelvic mass. ?  Serous cystadenoma   Plan:   Orders: No orders of the defined types were placed in this encounter.    1.  Laparoscopic left ovarian cystectomy-possible oophorectomy

## 2021-11-01 NOTE — Progress Notes (Signed)
PRE-OPERATIVE HISTORY AND PHYSICAL EXAM  PCP:  Einar Pheasant, MD Subjective:   HPI:  Sara Richardson is a 30 y.o. G1P1001.  Patient's last menstrual period was 10/16/2021 (exact date).  She presents today for a pre-op discussion and PE.  She has the following symptoms: She is asymptomatic but has a large left ovarian cyst which has persisted for several months.  Review of Systems:   Constitutional: Denied constitutional symptoms, night sweats, recent illness, fatigue, fever, insomnia and weight loss.  Eyes: Denied eye symptoms, eye pain, photophobia, vision change and visual disturbance.  Ears/Nose/Throat/Neck: Denied ear, nose, throat or neck symptoms, hearing loss, nasal discharge, sinus congestion and sore throat.  Cardiovascular: Denied cardiovascular symptoms, arrhythmia, chest pain/pressure, edema, exercise intolerance, orthopnea and palpitations.  Respiratory: Denied pulmonary symptoms, asthma, pleuritic pain, productive sputum, cough, dyspnea and wheezing.  Gastrointestinal: Denied, gastro-esophageal reflux, melena, nausea and vomiting.  Genitourinary: Denied genitourinary symptoms including symptomatic vaginal discharge, pelvic relaxation issues, and urinary complaints.  Musculoskeletal: Denied musculoskeletal symptoms, stiffness, swelling, muscle weakness and myalgia.  Dermatologic: Denied dermatology symptoms, rash and scar.  Neurologic: Denied neurology symptoms, dizziness, headache, neck pain and syncope.  Psychiatric: Denied psychiatric symptoms, anxiety and depression.  Endocrine: Denied endocrine symptoms including hot flashes and night sweats.   OB History  Gravida Para Term Preterm AB Living  1 1 1  0 0 1  SAB IAB Ectopic Multiple Live Births  0 0 0 0 1    # Outcome Date GA Lbr Len/2nd Weight Sex Delivery Anes PTL Lv  1 Term 08/29/21 [redacted]w[redacted]d / 01:56 7 lb 7.6 oz (3.39 kg) M Vag-Spont EPI, Local  LIV    Past Medical History:  Diagnosis Date   Anxiety    SVT  (supraventricular tachycardia) (HCC)     Past Surgical History:  Procedure Laterality Date   ABLATION     NO PAST SURGERIES        SOCIAL HISTORY:  Social History   Tobacco Use  Smoking Status Never  Smokeless Tobacco Never   Social History   Substance and Sexual Activity  Alcohol Use Not Currently    Social History   Substance and Sexual Activity  Drug Use No    Family History  Problem Relation Age of Onset   Breast cancer Maternal Grandmother        lung cancer    Prostate cancer Maternal Grandfather    Hypertension Mother    Heart Problems Father     ALLERGIES:  Sulfamethoxazole-trimethoprim  MEDS:   Current Outpatient Medications on File Prior to Visit  Medication Sig Dispense Refill   prenatal vitamin w/FE, FA (NATACHEW) 29-1 MG CHEW chewable tablet Chew 1 tablet by mouth daily at 12 noon.     No current facility-administered medications on file prior to visit.    No orders of the defined types were placed in this encounter.    Physical examination BP 116/79    Pulse 81    Ht 5\' 2"  (1.575 m)    Wt 153 lb 12.8 oz (69.8 kg)    LMP 10/16/2021 (Exact Date)    Breastfeeding Yes    BMI 28.13 kg/m   General NAD, Conversant  HEENT Atraumatic; Op clear with mmm.  Normo-cephalic. Pupils reactive. Anicteric sclerae  Thyroid/Neck Smooth without nodularity or enlargement. Normal ROM.  Neck Supple.  Skin No rashes, lesions or ulceration. Normal palpated skin turgor. No nodularity.  Breasts: No masses or discharge.  Symmetric.  No axillary  adenopathy.  Lungs: Clear to auscultation.No rales or wheezes. Normal Respiratory effort, no retractions.  Heart: NSR.  No murmurs or rubs appreciated. No periferal edema  Abdomen: Soft.  Non-tender.  No masses.  No HSM. No hernia  Extremities: Moves all appropriately.  Normal ROM for age. No lymphadenopathy.  Neuro: Oriented to PPT.  Normal mood. Normal affect.     Pelvic:   Vulva: Normal appearance.  No lesions.  Vagina:  No lesions or abnormalities noted.  Support: Normal pelvic support.  Urethra No masses tenderness or scarring.  Meatus Normal size without lesions or prolapse.  Cervix: Normal ectropion.  No lesions.  Anus: Normal exam.  No lesions.  Perineum: Normal exam.  No lesions.        Bimanual   Uterus: Normal size.  Non-tender.  Mobile.  AV.  Adnexae: No masses.  Non-tender to palpation.  Cul-de-sac: Negative for abnormality.   Assessment:   G1P1001 Patient Active Problem List   Diagnosis Date Noted   Gestational hypertension 08/28/2021   Group B streptococcal carriage complicating pregnancy 16/08/9603   Adnexal mass 03/23/2021   Bee sting 02/20/2020   Lymphadenopathy 02/20/2020   Healthcare maintenance 12/11/2017   Back pain 12/11/2017   SVT (supraventricular tachycardia) ([place]) 12/01/2016   Anxiety 12/01/2016   Menstrual cramps 12/01/2016   Hx of prior ablation treatment 06/26/2013    1. Preop examination   2. Pelvic mass    Persistent pelvic mass. ?  Serous cystadenoma   Plan:   Orders: No orders of the defined types were placed in this encounter.    1.  Laparoscopic left ovarian cystectomy-possible oophorectomy  Pre-op discussions regarding Risks and Benefits of her scheduled surgery.  Pelvic mass The patient and I have reviewed the option of surgery and have discussed the differential diagnosis of a pelvic mass in someone her age.  We have discussed the possibility of oophorectomy, both unilateral and bilateral.  We have also discussed the possibility of accompanying abdominal hysterectomy, should this be deemed necessary at the time of surgery.  Although the chance of malignancy is small, this has also been discussed with the patient and the possibility of lymph node dissection or more extensive debulking surgery has been discussed in detail.  The possibility of another surgeon being called to perform other additional procedures, should they be necessary, has been  discussed.  In addition to the above noted complications of a pelvic mass, we have also discussed the general complications found at any surgical procedure including bleeding, infection, damage to bowel, bladder, ureters or other internal organ, and the risk of anesthesia.  The above risks were specifically discussed, but the patient has been informed her complications may not be limited to them.  I have answered all of the patients questions and I believe she has an informed understanding of the surgery for pelvic mass.  I spent 30 minutes involved in the care of this patient preparing to see the patient by obtaining and reviewing her medical history (including labs, imaging tests and prior procedures), documenting clinical information in the electronic health record (EHR), counseling and coordinating care plans, writing and sending prescriptions, ordering tests or procedures and in direct communicating with the patient and medical staff discussing pertinent items from her history and physical exam.  Finis Bud, M.D. 11/01/2021 10:36 AM

## 2021-11-01 NOTE — Progress Notes (Signed)
Patient presents for pre-op appointment. Patient states she has decided to proceed with the surgery and does not voice concerns.

## 2021-11-02 ENCOUNTER — Encounter
Admission: RE | Admit: 2021-11-02 | Discharge: 2021-11-02 | Disposition: A | Discharge status: Home / Self Care | Payer: BC Managed Care – PPO | Source: Ambulatory Visit | Attending: Obstetrics and Gynecology | Admitting: Obstetrics and Gynecology

## 2021-11-02 ENCOUNTER — Other Ambulatory Visit: Payer: Self-pay

## 2021-11-02 NOTE — Patient Instructions (Signed)
Your procedure is scheduled on:11-09-21 Friday Report to the Registration Desk on the 1st floor of the [facility].Then proceed to the 2nd floor Surgery Desk in the [facility] To find out your arrival time, please call [phone] between 1PM - 3PM on:11-08-21 Thursday  REMEMBER: Instructions that are not followed completely may result in serious medical risk, up to and including death; or upon the discretion of your surgeon and anesthesiologist your surgery may need to be rescheduled.  Do not eat food after midnight the night before surgery.  No gum chewing, lozengers or hard candies.  You may however, drink CLEAR liquids up to 2 hours before you are scheduled to arrive for your surgery. Do not drink anything within 2 hours of your scheduled arrival time.  Clear liquids include: - water  - apple juice without pulp - gatorade (not RED, PURPLE, OR BLUE) - black coffee or tea (Do NOT add milk or creamers to the coffee or tea) Do NOT drink anything that is not on this list.  Do not take any medication the day of surgery  One week prior to surgery: Stop Anti-inflammatories (NSAIDS) such as Advil, Aleve, Ibuprofen, Motrin, Naproxen, Naprosyn and Aspirin based products such as Excedrin, Goodys Powder, BC Powder.You may however, continue to take Tylenol if needed for pain up until the day of surgery.  Stop ANY OVER THE COUNTER supplements/vitamins NOW (11-02-21) until after surgery (prenatal vitamin )  No Alcohol for 24 hours before or after surgery.  No Smoking including e-cigarettes for 24 hours prior to surgery.  No chewable tobacco products for at least 6 hours prior to surgery.  No nicotine patches on the day of surgery.  Do not use any "recreational" drugs for at least a week prior to your surgery.  Please be advised that the combination of cocaine and anesthesia may have negative outcomes, up to and including death. If you test positive for cocaine, your surgery will be  cancelled.  On the morning of surgery brush your teeth with toothpaste and water, you may rinse your mouth with mouthwash if you wish. Do not swallow any toothpaste or mouthwash.  Use CHG Soap as directed on instruction sheet.  Do not wear jewelry, make-up, hairpins, clips or nail polish.  Do not wear lotions, powders, or perfumes.   Do not shave body from the neck down 48 hours prior to surgery just in case you cut yourself which could leave a site for infection.  Also, freshly shaved skin may become irritated if using the CHG soap.  Contact lenses, hearing aids and dentures may not be worn into surgery.  Do not bring valuables to the hospital. [facility] is not responsible for any missing/lost belongings or valuables.   Notify your doctor if there is any change in your medical condition (cold, fever, infection).  Wear comfortable clothing (specific to your surgery type) to the hospital.  After surgery, you can help prevent lung complications by doing breathing exercises.  Take deep breaths and cough every 1-2 hours. Your doctor may order a device called an Incentive Spirometer to help you take deep breaths. When coughing or sneezing, hold a pillow firmly against your incision with both hands. This is called splinting. Doing this helps protect your incision. It also decreases belly discomfort.  If you are being admitted to the hospital overnight, leave your suitcase in the car. After surgery it may be brought to your room.  If you are being discharged the day of surgery, you  will not be allowed to drive home. You will need a responsible adult (18 years or older) to drive you home and stay with you that night.   If you are taking public transportation, you will need to have a responsible adult (18 years or older) with you. Please confirm with your physician that it is acceptable to use public transportation.   Please call the [facility] Dept[facility] at [phone] if you  have any questions about these instructions.  Surgery Visitation Policy:  Patients undergoing a surgery or procedure may have one family member or support person with them as long as that person is not COVID-19 positive or experiencing its symptoms.  That person may remain in the waiting area during the procedure and may rotate out with other people.  Inpatient Visitation:    Visiting hours are 7 a.m. to 8 p.m. Up to two visitors ages 16+ are allowed at one time in a patient room. The visitors may rotate out with other people during the day. Visitors must check out when they leave, or other visitors will not be allowed. One designated support person may remain overnight. The visitor must pass COVID-19 screenings, use hand sanitizer when entering and exiting the patients room and wear a mask at all times, including in the patients room. Patients must also wear a mask when staff or their visitor are in the room. Masking is required regardless of vaccination status.

## 2021-11-07 ENCOUNTER — Encounter
Admission: RE | Admit: 2021-11-07 | Discharge: 2021-11-07 | Disposition: A | Discharge status: Home / Self Care | Payer: BC Managed Care – PPO | Source: Ambulatory Visit | Attending: Obstetrics and Gynecology | Admitting: Obstetrics and Gynecology

## 2021-11-07 ENCOUNTER — Other Ambulatory Visit: Payer: Self-pay

## 2021-11-07 DIAGNOSIS — Z01812 Encounter for preprocedural laboratory examination: Secondary | ICD-10-CM | POA: Insufficient documentation

## 2021-11-07 DIAGNOSIS — D271 Benign neoplasm of left ovary: Secondary | ICD-10-CM | POA: Diagnosis not present

## 2021-11-07 DIAGNOSIS — Z01818 Encounter for other preprocedural examination: Secondary | ICD-10-CM

## 2021-11-07 DIAGNOSIS — N83202 Unspecified ovarian cyst, left side: Secondary | ICD-10-CM | POA: Diagnosis present

## 2021-11-07 LAB — CBC
HCT: 41.8 % (ref 36.0–46.0)
Hemoglobin: 13.4 g/dL (ref 12.0–15.0)
MCH: 25.6 pg — ABNORMAL LOW (ref 26.0–34.0)
MCHC: 32.1 g/dL (ref 30.0–36.0)
MCV: 79.8 fL — ABNORMAL LOW (ref 80.0–100.0)
Platelets: 358 K/uL (ref 150–400)
RBC: 5.24 MIL/uL — ABNORMAL HIGH (ref 3.87–5.11)
RDW: 14 % (ref 11.5–15.5)
WBC: 7.4 K/uL (ref 4.0–10.5)
nRBC: 0 % (ref 0.0–0.2)

## 2021-11-07 LAB — HCG, QUANTITATIVE, PREGNANCY: hCG, Beta Chain, Quant, S: <1 m[IU]/mL (ref <5)

## 2021-11-07 LAB — TYPE AND SCREEN
ABO/RH(D): O POS
Antibody Screen: NEGATIVE
Extend sample reason: UNDETERMINED

## 2021-11-09 ENCOUNTER — Ambulatory Visit
Admission: RE | Admit: 2021-11-09 | Discharge: 2021-11-09 | Disposition: A | Discharge status: Home / Self Care | Payer: BC Managed Care – PPO | Source: Ambulatory Visit | Attending: Obstetrics and Gynecology | Admitting: Obstetrics and Gynecology

## 2021-11-09 ENCOUNTER — Ambulatory Visit: Payer: BC Managed Care – PPO | Admitting: Registered Nurse

## 2021-11-09 ENCOUNTER — Encounter: Payer: Self-pay | Admitting: Obstetrics and Gynecology

## 2021-11-09 ENCOUNTER — Other Ambulatory Visit: Payer: Self-pay

## 2021-11-09 ENCOUNTER — Encounter
Admission: RE | Disposition: A | Discharge status: Home / Self Care | Payer: Self-pay | Source: Ambulatory Visit | Attending: Obstetrics and Gynecology

## 2021-11-09 DIAGNOSIS — D271 Benign neoplasm of left ovary: Secondary | ICD-10-CM | POA: Diagnosis not present

## 2021-11-09 DIAGNOSIS — N83202 Unspecified ovarian cyst, left side: Secondary | ICD-10-CM | POA: Diagnosis not present

## 2021-11-09 HISTORY — PX: LAPAROSCOPIC UNILATERAL SALPINGECTOMY: SHX5934

## 2021-11-09 HISTORY — PX: LAPAROSCOPIC OVARIAN CYSTECTOMY: SHX6248

## 2021-11-09 SURGERY — EXCISION, CYST, OVARY, LAPAROSCOPIC
Anesthesia: General | Site: Abdomen | Laterality: Left

## 2021-11-09 MED ORDER — PROPOFOL 1000 MG/100ML IV EMUL
INTRAVENOUS | Status: AC
  Filled 2021-11-09: qty 100

## 2021-11-09 MED ORDER — POVIDONE-IODINE 10 % EX SWAB: 2.0000 "application " | Freq: Once | CUTANEOUS | Status: DC

## 2021-11-09 MED ORDER — FENTANYL CITRATE (PF) 100 MCG/2ML IJ SOLN
INTRAMUSCULAR | Status: AC
  Administered 2021-11-09: 10:00:00 25 ug via INTRAVENOUS
  Filled 2021-11-09: qty 2

## 2021-11-09 MED ORDER — PROPOFOL 10 MG/ML IV BOLUS
INTRAVENOUS | Status: DC | PRN
  Administered 2021-11-09: 150 mg via INTRAVENOUS

## 2021-11-09 MED ORDER — FAMOTIDINE 20 MG PO TABS
ORAL_TABLET | ORAL | Status: AC
  Administered 2021-11-09: 07:00:00 20 mg via ORAL
  Filled 2021-11-09: qty 1

## 2021-11-09 MED ORDER — KETOROLAC TROMETHAMINE 30 MG/ML IJ SOLN
30.0000 mg | Freq: Once | INTRAMUSCULAR | Status: AC
  Administered 2021-11-09: 11:00:00 30 mg via INTRAVENOUS

## 2021-11-09 MED ORDER — ONDANSETRON HCL 4 MG/2ML IJ SOLN
INTRAMUSCULAR | Status: AC
  Filled 2021-11-09: qty 2

## 2021-11-09 MED ORDER — PROPOFOL 500 MG/50ML IV EMUL
INTRAVENOUS | Status: DC | PRN
  Administered 2021-11-09: 125 ug/kg/min via INTRAVENOUS

## 2021-11-09 MED ORDER — LACTATED RINGERS IV SOLN: INTRAVENOUS | Status: DC

## 2021-11-09 MED ORDER — CHLORHEXIDINE GLUCONATE 0.12 % MT SOLN: 15.0000 mL | Freq: Once | OROMUCOSAL | Status: AC

## 2021-11-09 MED ORDER — FENTANYL CITRATE (PF) 100 MCG/2ML IJ SOLN
INTRAMUSCULAR | Status: AC
  Filled 2021-11-09: qty 2

## 2021-11-09 MED ORDER — ORAL CARE MOUTH RINSE: 15.0000 mL | Freq: Once | OROMUCOSAL | Status: AC

## 2021-11-09 MED ORDER — LIDOCAINE HCL (CARDIAC) PF 100 MG/5ML IV SOSY
PREFILLED_SYRINGE | INTRAVENOUS | Status: DC | PRN
  Administered 2021-11-09: 80 mg via INTRAVENOUS

## 2021-11-09 MED ORDER — FAMOTIDINE 20 MG PO TABS: 20.0000 mg | ORAL_TABLET | Freq: Once | ORAL | Status: AC

## 2021-11-09 MED ORDER — ONDANSETRON HCL 4 MG/2ML IJ SOLN
INTRAMUSCULAR | Status: DC | PRN
  Administered 2021-11-09: 4 mg via INTRAVENOUS

## 2021-11-09 MED ORDER — LIDOCAINE HCL (PF) 2 % IJ SOLN
INTRAMUSCULAR | Status: AC
  Filled 2021-11-09: qty 5

## 2021-11-09 MED ORDER — HYDROCODONE-ACETAMINOPHEN 5-325 MG PO TABS: 1.0000 | ORAL_TABLET | Freq: Four times a day (QID) | ORAL | 0 refills | Status: DC | PRN

## 2021-11-09 MED ORDER — DEXAMETHASONE SODIUM PHOSPHATE 10 MG/ML IJ SOLN
INTRAMUSCULAR | Status: DC | PRN
  Administered 2021-11-09: 10 mg via INTRAVENOUS

## 2021-11-09 MED ORDER — SODIUM CHLORIDE FLUSH 0.9 % IV SOLN
INTRAVENOUS | Status: AC
  Filled 2021-11-09: qty 30

## 2021-11-09 MED ORDER — SUGAMMADEX SODIUM 200 MG/2ML IV SOLN
INTRAVENOUS | Status: DC | PRN
  Administered 2021-11-09: 150 mg via INTRAVENOUS

## 2021-11-09 MED ORDER — ROCURONIUM BROMIDE 100 MG/10ML IV SOLN
INTRAVENOUS | Status: DC | PRN
  Administered 2021-11-09: 40 mg via INTRAVENOUS
  Administered 2021-11-09: 10 mg via INTRAVENOUS
  Administered 2021-11-09: 20 mg via INTRAVENOUS

## 2021-11-09 MED ORDER — ACETAMINOPHEN 10 MG/ML IV SOLN
INTRAVENOUS | Status: AC
  Filled 2021-11-09: qty 100

## 2021-11-09 MED ORDER — OXYCODONE HCL 5 MG PO TABS
ORAL_TABLET | ORAL | Status: AC
  Administered 2021-11-09: 10:00:00 5 mg via ORAL
  Filled 2021-11-09: qty 1

## 2021-11-09 MED ORDER — KETOROLAC TROMETHAMINE 30 MG/ML IJ SOLN
INTRAMUSCULAR | Status: DC | PRN
  Administered 2021-11-09: 30 mg via INTRAVENOUS

## 2021-11-09 MED ORDER — ROCURONIUM BROMIDE 10 MG/ML (PF) SYRINGE
PREFILLED_SYRINGE | INTRAVENOUS | Status: AC
  Filled 2021-11-09: qty 10

## 2021-11-09 MED ORDER — ONDANSETRON 4 MG PO TBDP: 4.0000 mg | ORAL_TABLET | Freq: Four times a day (QID) | ORAL | Status: DC | PRN

## 2021-11-09 MED ORDER — DEXAMETHASONE SODIUM PHOSPHATE 10 MG/ML IJ SOLN
INTRAMUSCULAR | Status: AC
  Filled 2021-11-09: qty 1

## 2021-11-09 MED ORDER — VASOPRESSIN 20 UNIT/ML IV SOLN
INTRAVENOUS | Status: AC
  Filled 2021-11-09: qty 1

## 2021-11-09 MED ORDER — FENTANYL CITRATE (PF) 100 MCG/2ML IJ SOLN
INTRAMUSCULAR | Status: DC | PRN
  Administered 2021-11-09 (×4): 50 ug via INTRAVENOUS

## 2021-11-09 MED ORDER — OXYCODONE HCL 5 MG/5ML PO SOLN: 5.0000 mg | Freq: Once | ORAL | Status: AC | PRN

## 2021-11-09 MED ORDER — CHLORHEXIDINE GLUCONATE 0.12 % MT SOLN
OROMUCOSAL | Status: AC
  Administered 2021-11-09: 07:00:00 15 mL via OROMUCOSAL
  Filled 2021-11-09: qty 15

## 2021-11-09 MED ORDER — MIDAZOLAM HCL 2 MG/2ML IJ SOLN
INTRAMUSCULAR | Status: DC | PRN
Start: 2021-11-09 — End: 2021-11-09
  Administered 2021-11-09 (×2): 1 mg via INTRAVENOUS

## 2021-11-09 MED ORDER — 0.9 % SODIUM CHLORIDE (POUR BTL) OPTIME
TOPICAL | Status: DC | PRN
  Administered 2021-11-09: 08:00:00 500 mL

## 2021-11-09 MED ORDER — ACETAMINOPHEN 10 MG/ML IV SOLN: 1000.0000 mg | Freq: Once | INTRAVENOUS | Status: DC | PRN

## 2021-11-09 MED ORDER — PHENYLEPHRINE HCL-NACL 20-0.9 MG/250ML-% IV SOLN
INTRAVENOUS | Status: AC
  Filled 2021-11-09: qty 250

## 2021-11-09 MED ORDER — OXYCODONE HCL 5 MG PO TABS: 5.0000 mg | ORAL_TABLET | Freq: Once | ORAL | Status: AC | PRN

## 2021-11-09 MED ORDER — KETOROLAC TROMETHAMINE 30 MG/ML IJ SOLN
INTRAMUSCULAR | Status: AC
  Filled 2021-11-09: qty 1

## 2021-11-09 MED ORDER — MIDAZOLAM HCL 2 MG/2ML IJ SOLN
INTRAMUSCULAR | Status: AC
  Filled 2021-11-09: qty 2

## 2021-11-09 MED ORDER — ONDANSETRON HCL 4 MG/2ML IJ SOLN: 4.0000 mg | Freq: Once | INTRAMUSCULAR | Status: DC | PRN

## 2021-11-09 MED ORDER — OXYCODONE-ACETAMINOPHEN 5-325 MG PO TABS: 1.0000 | ORAL_TABLET | ORAL | Status: DC | PRN

## 2021-11-09 MED ORDER — ACETAMINOPHEN 10 MG/ML IV SOLN
INTRAVENOUS | Status: DC | PRN
  Administered 2021-11-09: 1000 mg via INTRAVENOUS

## 2021-11-09 MED ORDER — BUPIVACAINE HCL (PF) 0.5 % IJ SOLN
INTRAMUSCULAR | Status: AC
  Filled 2021-11-09: qty 30

## 2021-11-09 MED ORDER — ONDANSETRON HCL 4 MG/2ML IJ SOLN: 4.0000 mg | Freq: Four times a day (QID) | INTRAMUSCULAR | Status: DC | PRN

## 2021-11-09 MED ORDER — FENTANYL CITRATE (PF) 100 MCG/2ML IJ SOLN
25.0000 ug | INTRAMUSCULAR | Status: DC | PRN
  Administered 2021-11-09: 10:00:00 25 ug via INTRAVENOUS

## 2021-11-09 MED ORDER — DEXTROSE IN LACTATED RINGERS 5 % IV SOLN: INTRAVENOUS | Status: DC

## 2021-11-09 MED ORDER — IBUPROFEN 600 MG PO TABS: 600.0000 mg | ORAL_TABLET | Freq: Four times a day (QID) | ORAL | 0 refills | Status: DC | PRN

## 2021-11-09 SURGICAL SUPPLY — 50 items
ADHESIVE MASTISOL STRL (MISCELLANEOUS) ×4 IMPLANT
BAG URINE DRAIN 2000ML AR STRL (UROLOGICAL SUPPLIES) ×4 IMPLANT
BLADE SURG SZ11 CARB STEEL (BLADE) ×4 IMPLANT
CATH FOLEY 2WAY  5CC 16FR (CATHETERS) ×1
CATH URTH 16FR FL 2W BLN LF (CATHETERS) ×3 IMPLANT
CHLORAPREP W/TINT 26 (MISCELLANEOUS) ×4 IMPLANT
DEFOGGER SCOPE WARMER CLEARIFY (MISCELLANEOUS) ×4 IMPLANT
DERMABOND ADVANCED (GAUZE/BANDAGES/DRESSINGS) ×1
DERMABOND ADVANCED .7 DNX12 (GAUZE/BANDAGES/DRESSINGS) ×1 IMPLANT
ELECT REM PT RETURN 9FT ADLT (ELECTROSURGICAL) ×4
ELECTRODE REM PT RTRN 9FT ADLT (ELECTROSURGICAL) ×3 IMPLANT
GAUZE 4X4 16PLY ~~LOC~~+RFID DBL (SPONGE) ×8 IMPLANT
GLOVE SURG ENC MOIS LTX SZ8 (GLOVE) ×8 IMPLANT
GLOVE SURG POLY ORTHO LF SZ7.5 (GLOVE) ×4 IMPLANT
GOWN STRL REUS W/ TWL LRG LVL3 (GOWN DISPOSABLE) ×3 IMPLANT
GOWN STRL REUS W/ TWL XL LVL3 (GOWN DISPOSABLE) ×6 IMPLANT
GOWN STRL REUS W/TWL LRG LVL3 (GOWN DISPOSABLE) ×1
GOWN STRL REUS W/TWL XL LVL3 (GOWN DISPOSABLE) ×2
GRASPER SUT TROCAR 14GX15 (MISCELLANEOUS) ×2 IMPLANT
HANDLE YANKAUER SUCT BULB TIP (MISCELLANEOUS) ×4 IMPLANT
IRRIGATION STRYKERFLOW (MISCELLANEOUS) IMPLANT
IRRIGATOR STRYKERFLOW (MISCELLANEOUS)
IV LACTATED RINGERS 1000ML (IV SOLUTION) ×2 IMPLANT
KIT PINK PAD W/HEAD ARE REST (MISCELLANEOUS) ×4
KIT PINK PAD W/HEAD ARM REST (MISCELLANEOUS) ×3 IMPLANT
KIT TURNOVER CYSTO (KITS) ×4 IMPLANT
LIGASURE LAP MARYLAND 5MM 37CM (ELECTROSURGICAL) ×2 IMPLANT
MANIFOLD NEPTUNE II (INSTRUMENTS) ×4 IMPLANT
NS IRRIG 500ML POUR BTL (IV SOLUTION) ×2 IMPLANT
PACK BASIN MINOR ARMC (MISCELLANEOUS) ×4 IMPLANT
PACK GYN LAPAROSCOPIC (MISCELLANEOUS) ×4 IMPLANT
PAD OB MATERNITY 4.3X12.25 (PERSONAL CARE ITEMS) ×4 IMPLANT
PAD PREP 24X41 OB/GYN DISP (PERSONAL CARE ITEMS) ×4 IMPLANT
POUCH SPECIMEN RETRIEVAL 10MM (ENDOMECHANICALS) ×2 IMPLANT
RETRACTOR PHONTONGUIDE ADAPT (ADAPTER) ×2 IMPLANT
SCRUB EXIDINE 4% CHG 4OZ (MISCELLANEOUS) ×4 IMPLANT
SLEEVE ENDOPATH XCEL 5M (ENDOMECHANICALS) ×8 IMPLANT
SOLUTION ELECTROLUBE (MISCELLANEOUS) ×2 IMPLANT
SPONGE T-LAP 18X18 ~~LOC~~+RFID (SPONGE) ×2 IMPLANT
STRIP CLOSURE SKIN 1/2X4 (GAUZE/BANDAGES/DRESSINGS) ×4 IMPLANT
SUT VIC AB 0 CT1 27 (SUTURE) ×2
SUT VIC AB 0 CT1 27XCR 8 STRN (SUTURE) ×6 IMPLANT
SUT VIC AB 0 CT1 36 (SUTURE) ×8 IMPLANT
SUT VIC AB 4-0 FS2 27 (SUTURE) ×4 IMPLANT
SYR 10ML LL (SYRINGE) ×4 IMPLANT
TAPE TRANSPORE STRL 2 31045 (GAUZE/BANDAGES/DRESSINGS) ×2 IMPLANT
TROCAR XCEL NON-BLD 11X100MML (ENDOMECHANICALS) ×2 IMPLANT
TROCAR XCEL NON-BLD 5MMX100MML (ENDOMECHANICALS) ×4 IMPLANT
TUBING EVAC SMOKE HEATED PNEUM (TUBING) ×4 IMPLANT
WATER STERILE IRR 500ML POUR (IV SOLUTION) ×4 IMPLANT

## 2021-11-09 NOTE — Anesthesia Preprocedure Evaluation (Signed)
Anesthesia Evaluation  Patient identified by MRN, date of birth, ID band Patient awake    Reviewed: Allergy & Precautions, NPO status , Patient's Chart, lab work & pertinent test results  History of Anesthesia Complications Negative for: history of anesthetic complications  Airway Mallampati: II  TM Distance: >3 FB Neck ROM: Full    Dental no notable dental hx. (+) Teeth Intact   Pulmonary neg pulmonary ROS, neg sleep apnea, neg COPD, Patient abstained from smoking.Not current smoker,    Pulmonary exam normal breath sounds clear to auscultation       Cardiovascular Exercise Tolerance: Good METS(-) hypertension(-) CAD and (-) Past MI negative cardio ROS  (-) dysrhythmias  Rhythm:Regular Rate:Normal - Systolic murmurs    Neuro/Psych PSYCHIATRIC DISORDERS Anxiety negative neurological ROS     GI/Hepatic neg GERD  ,(+)     (-) substance abuse  ,   Endo/Other  neg diabetes  Renal/GU negative Renal ROS     Musculoskeletal   Abdominal   Peds  Hematology   Anesthesia Other Findings Past Medical History: No date: Anxiety No date: SVT (supraventricular tachycardia) (HCC)     Comment:  no problem since last ablation in 2012  Reproductive/Obstetrics                             Anesthesia Physical Anesthesia Plan  ASA: 1  Anesthesia Plan: General   Post-op Pain Management: Ofirmev IV (intra-op), Gabapentin PO (pre-op) and Toradol IV (intra-op)   Induction: Intravenous  PONV Risk Score and Plan: 4 or greater and Ondansetron, Dexamethasone, Midazolam, TIVA and Propofol infusion  Airway Management Planned: Oral ETT  Additional Equipment: None  Intra-op Plan:   Post-operative Plan: Extubation in OR  Informed Consent: I have reviewed the patients History and Physical, chart, labs and discussed the procedure including the risks, benefits and alternatives for the proposed anesthesia with  the patient or authorized representative who has indicated his/her understanding and acceptance.     Dental advisory given  Plan Discussed with: CRNA and Surgeon  Anesthesia Plan Comments: (Discussed risks of anesthesia with patient, including PONV, sore throat, lip/dental/eye damage. Rare risks discussed as well, such as cardiorespiratory and neurological sequelae, and allergic reactions. Discussed the role of CRNA in patient's perioperative care. Patient understands.)        Anesthesia Quick Evaluation

## 2021-11-09 NOTE — Transfer of Care (Signed)
Immediate Anesthesia Transfer of Care Note  Patient: Sara Richardson  Procedure(s) Performed: LAPAROSCOPIC OVARIAN CYSTECTOMY (Left: Abdomen) LAPAROSCOPIC LEFT PARTIAL SALPINGECTOMY (Abdomen)  Patient Location: PACU  Anesthesia Type:General  Level of Consciousness: awake, alert  and drowsy  Airway & Oxygen Therapy: Patient Spontanous Breathing and Patient connected to face mask oxygen  Post-op Assessment: Report given to RN and Post -op Vital signs reviewed and stable  Post vital signs: Reviewed and stable  Last Vitals:  Vitals Value Taken Time  BP 108/66 11/09/21 0932  Temp    Pulse 91 11/09/21 0932  Resp 16 11/09/21 0932  SpO2 100 % 11/09/21 0932  Vitals shown include unvalidated device data.  Last Pain:  Vitals:   11/09/21 0648  TempSrc: Oral  PainSc: 0-No pain         Complications: No notable events documented.

## 2021-11-09 NOTE — OR Nursing (Signed)
Per Dr. Amalia Hailey, secure-chat, follow up in one week

## 2021-11-09 NOTE — Discharge Instructions (Signed)
AMBULATORY SURGERY  DISCHARGE INSTRUCTIONS   The drugs that you were given will stay in your system until tomorrow so for the next 24 hours you should not:  Drive an automobile Make any legal decisions Drink any alcoholic beverage   You may resume regular meals tomorrow.  Today it is better to start with liquids and gradually work up to solid foods.  You may eat anything you prefer, but it is better to start with liquids, then soup and crackers, and gradually work up to solid foods.   Please notify your doctor immediately if you have any unusual bleeding, trouble breathing, redness and pain at the surgery site, drainage, fever, or pain not relieved by medication.       Please contact your physician with any problems or Same Day Surgery at [phone], Monday through Friday 6 am to 4 pm, or [place] [facility] [place] Main number at [phone].  

## 2021-11-09 NOTE — Op Note (Addendum)
° ° °    OPERATIVE NOTE 11/09/2021 9:57 AM  PRE-OPERATIVE DIAGNOSIS:  1) Large Adnexal Cyst  POST-OPERATIVE DIAGNOSIS:  * No Diagnosis Codes entered *  OPERATION:  LAPAROSCOPIC OVARIAN CYSTECTOMY: 75916 (CPT) LAPAROSCOPIC LEFT PARTIAL SALPINGECTOMY: 38466 (CPT)  SURGEON(S): Surgeon(s) and Role:    Harlin Heys, MD - Primary    Rubie Maid, MD   No other capable assistant was available for this surgery which requires an experienced, high level assistant.  She provided exposure, dissection, suctioning, retraction, and general support and assistance during the procedure.   ANESTHESIA: General  ESTIMATED BLOOD LOSS: 5 mL  OPERATIVE FINDINGS: Large simple adnexal cyst ovary vs tube?  SPECIMEN:  ID Type Source Tests Collected by Time Destination  1 : LEFT CYST WALL AND FALLOPIAN TUBE Tissue PATH Other SURGICAL PATHOLOGY Harlin Heys, MD [id number]     COMPLICATIONS: None  DISPOSITION: Stable to recovery room  DESCRIPTION OF PROCEDURE:      The patient was prepped and draped in the dorsolithotomy position and placed under general anesthesia. The bladder was emptied. The cervix was grasped with a multi-toothed tenaculum and a uterine manipulator was placed within the cervical os respecting the position and curvature of the uterus. After changing gloves we proceeded abdominally. A small infraumbilical incision was made and a 5 mm trocar port was placed within the abdominopelvic cavity. The opening pressure was less than 7 mmHg.  Approximately 3 and 1/2 L of carbon dioxide gas was instilled within the abdominal pelvic cavity. The laparoscope was placed and the pelvis and abdomen were carefully inspected. A large simple cystic structure was found in the left adnexa.  The infundibulopelvic ligament was identified.  Normal ovary was identified.  A needle was placed through the left lower quadrant incision and the cystic structure was systematically drained using  suction.  The cystic structure was systematically cut free from the ovary using coagulation current and sharp dissection.  The fallopian tube was initially spared.  After peeling the cystic structure from the fallopian tube the fallopian tube was carefully inspected.  It was noted to be significantly damaged and did not appear as if it was worthy of saving.  The fallopian tube was then coagulated approximately at the midpoint and cut free.  All specimens were placed within an Endo Catch bag and removed through the left lower quadrant during incision. Hemostasis of all areas of the pelvis was noted. The ports were removed under direct visualization.  Hemostasis was noted.  The laparoscope was removed the trocar sleeve was removed and the incision was closed with a deep suture through the fascia of 0 Vicryl followed by subcuticular closure of the skin for all port sites. A long-acting anesthetic was injected.  Dermabond was applied. The uterine manipulator was removed. Hemostasis of the cervix was noted. The patient went to the recovery room in stable condition.  Finis Bud, M.D. 11/09/2021 9:57 AM

## 2021-11-09 NOTE — Anesthesia Procedure Notes (Signed)
Procedure Name: Intubation Date/Time: 11/09/2021 7:45 AM Performed by: Johnna Acosta, CRNA Pre-anesthesia Checklist: Patient identified, Emergency Drugs available, Suction available, Patient being monitored and Timeout performed Patient Re-evaluated:Patient Re-evaluated prior to induction Oxygen Delivery Method: Circle system utilized Preoxygenation: Pre-oxygenation with 100% oxygen Induction Type: IV induction Ventilation: Mask ventilation without difficulty Laryngoscope Size: McGraph and 3 Tube type: Oral Tube size: 6.5 mm Number of attempts: 1 Airway Equipment and Method: Stylet and Video-laryngoscopy Placement Confirmation: ETT inserted through vocal cords under direct vision, positive ETCO2 and breath sounds checked- equal and bilateral Secured at: 19 cm Tube secured with: Tape Dental Injury: Teeth and Oropharynx as per pre-operative assessment

## 2021-11-09 NOTE — Anesthesia Procedure Notes (Signed)
Procedure Name: Intubation Date/Time: 11/09/2021 7:45 AM Performed by: Johnna Acosta, CRNA Pre-anesthesia Checklist: Patient identified, Patient being monitored, Timeout performed, Emergency Drugs available and Suction available Patient Re-evaluated:Patient Re-evaluated prior to induction Oxygen Delivery Method: Circle system utilized Preoxygenation: Pre-oxygenation with 100% oxygen Induction Type: IV induction Ventilation: Mask ventilation without difficulty Laryngoscope Size: 3 and McGraph Grade View: Grade I Tube type: Oral Tube size: 6.5 mm Number of attempts: 1 Airway Equipment and Method: Stylet Placement Confirmation: ETT inserted through vocal cords under direct vision, positive ETCO2 and breath sounds checked- equal and bilateral Secured at: 19 cm Tube secured with: Tape Dental Injury: Teeth and Oropharynx as per pre-operative assessment

## 2021-11-09 NOTE — Interval H&P Note (Signed)
History and Physical Interval Note:  11/09/2021 7:41 AM  Sara Richardson  has presented today for surgery, with the diagnosis of Lareg Adnexal Cyst.  The various methods of treatment have been discussed with the patient and family. After consideration of risks, benefits and other options for treatment, the patient has consented to  Procedure(s): LAPAROSCOPIC OVARIAN CYSTECTOMY (Left) LAPAROSCOPIC OOPHORECTOMY (N/A) as a surgical intervention.  The patient's history has been reviewed, patient examined, no change in status, stable for surgery.  I have reviewed the patient's chart and labs.  Questions were answered to the patient's satisfaction.     Jeannie Fend

## 2021-11-09 NOTE — Anesthesia Postprocedure Evaluation (Signed)
Anesthesia Post Note  Patient: Sara Richardson  Procedure(s) Performed: LAPAROSCOPIC OVARIAN CYSTECTOMY (Left: Abdomen) LAPAROSCOPIC LEFT PARTIAL SALPINGECTOMY (Abdomen)  Patient location during evaluation: PACU Anesthesia Type: General Level of consciousness: awake and alert, awake and oriented Pain management: pain level controlled Vital Signs Assessment: post-procedure vital signs reviewed and stable Respiratory status: spontaneous breathing, nonlabored ventilation and respiratory function stable Cardiovascular status: blood pressure returned to baseline and stable Postop Assessment: no apparent nausea or vomiting Anesthetic complications: no   No notable events documented.   Last Vitals:  Vitals:   11/09/21 1029 11/09/21 1100  BP: 118/71 117/69  Pulse: 91 82  Resp: 18 16  Temp: 36.4 C   SpO2: 100% 100%    Last Pain:  Vitals:   11/09/21 1100  TempSrc:   PainSc: 2                  Phill Mutter

## 2021-11-13 LAB — SURGICAL PATHOLOGY

## 2021-11-15 ENCOUNTER — Ambulatory Visit (INDEPENDENT_AMBULATORY_CARE_PROVIDER_SITE_OTHER): Payer: BC Managed Care – PPO | Admitting: Obstetrics and Gynecology

## 2021-11-15 ENCOUNTER — Encounter: Payer: Self-pay | Admitting: Obstetrics and Gynecology

## 2021-11-15 ENCOUNTER — Other Ambulatory Visit: Payer: Self-pay

## 2021-11-15 VITALS — BP 113/70 | HR 78 | Ht 62.0 in | Wt 151.0 lb

## 2021-11-15 DIAGNOSIS — Z9889 Other specified postprocedural states: Secondary | ICD-10-CM

## 2021-11-15 NOTE — Progress Notes (Signed)
HPI:      [name] is a 31 y.o. G1P1001 who LMP was Patient's last menstrual period was 10/16/2021 (exact date).  Subjective:   She presents today approximately a week from left ovarian cystectomy.  She reports she is doing well now.  She is not having much pain she is eating voiding and having bowel movements without difficulty.    Hx: The following portions of the patient's history were reviewed and updated as appropriate:             She  has a past medical history of Anxiety and SVT (supraventricular tachycardia) ([place]). She does not have any pertinent problems on file. She  has a past surgical history that includes Ablation (2012); Laparoscopic ovarian cystectomy (Left, 11/09/2021); and Laparoscopic unilateral salpingectomy (11/09/2021). Her family history includes Breast cancer in her maternal grandmother; Heart Problems in her father; Hypertension in her mother; Prostate cancer in her maternal grandfather. She  reports that she has never smoked. She has never used smokeless tobacco. She reports that she does not currently use alcohol. She reports that she does not use drugs. She has a current medication list which includes the following prescription(s): ibuprofen, prenatal vitamin w/fe, fa, and hydrocodone-acetaminophen. She is allergic to sulfamethoxazole-trimethoprim.       Review of Systems:  Review of Systems  Constitutional: Denied constitutional symptoms, night sweats, recent illness, fatigue, fever, insomnia and weight loss.  Eyes: Denied eye symptoms, eye pain, photophobia, vision change and visual disturbance.  Ears/Nose/Throat/Neck: Denied ear, nose, throat or neck symptoms, hearing loss, nasal discharge, sinus congestion and sore throat.  Cardiovascular: Denied cardiovascular symptoms, arrhythmia, chest pain/pressure, edema, exercise intolerance, orthopnea and palpitations.  Respiratory: Denied pulmonary symptoms, asthma, pleuritic pain, productive sputum, cough,  dyspnea and wheezing.  Gastrointestinal: Denied, gastro-esophageal reflux, melena, nausea and vomiting.  Genitourinary: Denied genitourinary symptoms including symptomatic vaginal discharge, pelvic relaxation issues, and urinary complaints.  Musculoskeletal: Denied musculoskeletal symptoms, stiffness, swelling, muscle weakness and myalgia.  Dermatologic: Denied dermatology symptoms, rash and scar.  Neurologic: Denied neurology symptoms, dizziness, headache, neck pain and syncope.  Psychiatric: Denied psychiatric symptoms, anxiety and depression.  Endocrine: Denied endocrine symptoms including hot flashes and night sweats.   Meds:   Current Outpatient Medications on File Prior to Visit  Medication Sig Dispense Refill   ibuprofen (ADVIL) 600 MG tablet Take 1 tablet (600 mg total) by mouth every 6 (six) hours as needed. 60 tablet 0   prenatal vitamin w/FE, FA (NATACHEW) 29-1 MG CHEW chewable tablet Chew 1 tablet by mouth daily at 12 noon.     HYDROcodone-acetaminophen (NORCO/VICODIN) 5-325 MG tablet Take 1-2 tablets by mouth every 6 (six) hours as needed for moderate pain. (Patient not taking: Reported on 11/15/2021) 30 tablet 0   No current facility-administered medications on file prior to visit.      Objective:     Vitals:   11/15/21 1151  BP: 113/70  Pulse: 78   Filed Weights   11/15/21 1151  Weight: 151 lb (68.5 kg)               Abdomen: Soft.  Non-tender.  No masses.  No HSM.  Incision/s: Intact.  Healing well.  No erythema.  No drainage.             Assessment:    G1P1001 Patient Active Problem List   Diagnosis Date Noted   Gestational hypertension 08/28/2021   Group B streptococcal carriage complicating pregnancy 50/93/2671   Adnexal mass 03/23/2021  Bee sting 02/20/2020   Lymphadenopathy 02/20/2020   Healthcare maintenance 12/11/2017   Back pain 12/11/2017   SVT (supraventricular tachycardia) ([place]) 12/01/2016   Anxiety 12/01/2016   Menstrual cramps  12/01/2016   Hx of prior ablation treatment 06/26/2013     1. Post-operative state     Pathology reveals serous cystadenoma  Patient with excellent recovery from surgery   Plan:            1.  Surgery reviewed.  Path report reviewed.  2.  She may resume normal activities.  3.  Plan follow-up for annual examination/Pap smear. Orders No orders of the defined types were placed in this encounter.   No orders of the defined types were placed in this encounter.     F/U  Return in about 6 months (around 05/15/2022) for Annual Physical.  Finis Bud, M.D. 11/15/2021 12:07 PM

## 2021-11-22 ENCOUNTER — Telehealth: Payer: Self-pay | Admitting: Obstetrics and Gynecology

## 2021-11-22 ENCOUNTER — Encounter: Payer: Self-pay | Admitting: Obstetrics and Gynecology

## 2021-11-22 NOTE — Telephone Encounter (Signed)
Pt called and stated she think she may have a clogged milk duct. Said L breast is extremely sore and tender. Stated she had a knot on breast about a month ago, but no visible knot and has been hurting since Tuesday 11/20/2021. Stated it is not warm to the touch but hurts really bad. No signs of fever. Please advise.

## 2021-11-23 NOTE — Telephone Encounter (Signed)
Called patient to discuss engorgement protocol. She verbalized understanding. Also discussed symptoms of mastitis and informed her if she was to develop any fever, hotness in her breast or overall achiness to call back for antibiotics. She has been using compresses and will apply the cool compress between feedings.

## 2021-12-13 ENCOUNTER — Other Ambulatory Visit: Payer: Self-pay

## 2021-12-13 ENCOUNTER — Ambulatory Visit (INDEPENDENT_AMBULATORY_CARE_PROVIDER_SITE_OTHER): Payer: BC Managed Care – PPO | Admitting: Internal Medicine

## 2021-12-13 VITALS — BP 106/70 | HR 84 | Temp 97.4°F | Resp 16 | Wt 149.4 lb

## 2021-12-13 DIAGNOSIS — N9489 Other specified conditions associated with female genital organs and menstrual cycle: Secondary | ICD-10-CM

## 2021-12-13 DIAGNOSIS — F419 Anxiety disorder, unspecified: Secondary | ICD-10-CM

## 2021-12-13 DIAGNOSIS — L659 Nonscarring hair loss, unspecified: Secondary | ICD-10-CM | POA: Insufficient documentation

## 2021-12-13 DIAGNOSIS — Z0000 Encounter for general adult medical examination without abnormal findings: Secondary | ICD-10-CM

## 2021-12-13 DIAGNOSIS — I471 Supraventricular tachycardia: Secondary | ICD-10-CM

## 2021-12-13 NOTE — Progress Notes (Signed)
Patient ID: Sara Richardson, female   DOB: May 16, 1991, 31 y.o.   MRN: [id number]   Subjective:    Patient ID: Sara Richardson, female    DOB: [birth year], 31 y.o.   MRN: [id number]  This visit occurred during the SARS-CoV-2 public health emergency.  Safety protocols were in place, including screening questions prior to the visit, additional usage of staff PPE, and extensive cleaning of exam room while observing appropriate contact time as indicated for disinfecting solutions.   Patient here for her physical exam.   Chief Complaint  Patient presents with   Annual Exam   .   HPI Is s/p left ovarian cystectomy (11/09/21) and laparoscopic unilateral salpingectomy 11/09/21.  Is s/p vaginal delivery 08/2021.  She has done well since her surgery.  Has been followed by gyn.  Menstrual period - early December.  Bled approximately 5 days.  Period 1/23 - still bleeding. Discussed f/u with gyn.  No chest pain or sob reported.  No significant abdominal pain.  Bowels moving.  Does report hair loss - noticed over the last two weeks.     Past Medical History:  Diagnosis Date   Anxiety    SVT (supraventricular tachycardia) ([place])    no problem since last ablation in 2012   Past Surgical History:  Procedure Laterality Date   ABLATION  2012   x2 heart ablation   LAPAROSCOPIC OVARIAN CYSTECTOMY Left 11/09/2021   Procedure: LAPAROSCOPIC OVARIAN CYSTECTOMY;  Surgeon: Harlin Heys, MD;  Location: [facility] ORS;  Service: Gynecology;  Laterality: Left;   LAPAROSCOPIC UNILATERAL SALPINGECTOMY  11/09/2021   Procedure: LAPAROSCOPIC LEFT PARTIAL SALPINGECTOMY;  Surgeon: Harlin Heys, MD;  Location: [facility] ORS;  Service: Gynecology;;   Family History  Problem Relation Age of Onset   Breast cancer Maternal Grandmother        lung cancer    Prostate cancer Maternal Grandfather    Hypertension Mother    Heart Problems Father    Social History   Socioeconomic History   Marital status: Married    Spouse  name: Haze Boyden   Number of children: Not on file   Years of education: Not on file   Highest education level: Not on file  Occupational History   Occupation: Pharmacist, hospital    CommentSports administrator - second grade  Tobacco Use   Smoking status: Never   Smokeless tobacco: Never  Vaping Use   Vaping Use: Never used  Substance and Sexual Activity   Alcohol use: Not Currently   Drug use: No   Sexual activity: Yes    Partners: Male    Birth control/protection: None  Other Topics Concern   Not on file  Social History Narrative   Not on file   Social Determinants of Health   Financial Resource Strain: Not on file  Food Insecurity: Not on file  Transportation Needs: Not on file  Physical Activity: Not on file  Stress: Not on file  Social Connections: Not on file     Review of Systems  Constitutional:  Negative for appetite change and unexpected weight change.  HENT:  Negative for congestion, sinus pressure and sore throat.   Eyes:  Negative for pain and visual disturbance.  Respiratory:  Negative for cough, chest tightness and shortness of breath.   Cardiovascular:  Negative for chest pain, palpitations and leg swelling.  Gastrointestinal:  Negative for abdominal pain, diarrhea, nausea and vomiting.  Genitourinary:  Negative for difficulty urinating and dysuria.  Musculoskeletal:  Negative for joint  swelling and myalgias.  Skin:  Negative for color change and rash.  Neurological:  Negative for dizziness, light-headedness and headaches.  Hematological:  Negative for adenopathy. Does not bruise/bleed easily.  Psychiatric/Behavioral:  Negative for agitation and dysphoric mood.       Objective:     BP 106/70    Pulse 84    Temp (!) 97.4 F (36.3 C)    Resp 16    Wt 149 lb 6.4 oz (67.8 kg)    SpO2 99%    BMI 27.33 kg/m  Wt Readings from Last 3 Encounters:  12/13/21 149 lb 6.4 oz (67.8 kg)  11/15/21 151 lb (68.5 kg)  11/09/21 153 lb 12.8 oz (69.8 kg)    Physical Exam Vitals  reviewed.  Constitutional:      General: She is not in acute distress.    Appearance: Normal appearance.  HENT:     Head: Normocephalic and atraumatic.     Right Ear: External ear normal.     Left Ear: External ear normal.  Eyes:     General: No scleral icterus.       Right eye: No discharge.        Left eye: No discharge.     Conjunctiva/sclera: Conjunctivae normal.  Neck:     Thyroid: No thyromegaly.  Cardiovascular:     Rate and Rhythm: Normal rate and regular rhythm.  Pulmonary:     Effort: No respiratory distress.     Breath sounds: Normal breath sounds. No wheezing.  Abdominal:     General: Bowel sounds are normal.     Palpations: Abdomen is soft.     Tenderness: There is no abdominal tenderness.  Musculoskeletal:        General: No swelling or tenderness.     Cervical back: Neck supple. No tenderness.  Lymphadenopathy:     Cervical: No cervical adenopathy.  Skin:    Findings: No erythema or rash.  Neurological:     Mental Status: She is alert.  Psychiatric:        Mood and Affect: Mood normal.        Behavior: Behavior normal.     Outpatient Encounter Medications as of 12/13/2021  Medication Sig   ibuprofen (ADVIL) 600 MG tablet Take 1 tablet (600 mg total) by mouth every 6 (six) hours as needed.   prenatal vitamin w/FE, FA (NATACHEW) 29-1 MG CHEW chewable tablet Chew 1 tablet by mouth daily at 12 noon.   [DISCONTINUED] HYDROcodone-acetaminophen (NORCO/VICODIN) 5-325 MG tablet Take 1-2 tablets by mouth every 6 (six) hours as needed for moderate pain. (Patient not taking: Reported on 12/13/2021)   No facility-administered encounter medications on file as of 12/13/2021.     Lab Results  Component Value Date   WBC 8.5 12/13/2021   HGB 12.5 12/13/2021   HCT 38.1 12/13/2021   PLT 395.0 12/13/2021   GLUCOSE 87 12/13/2021   ALT 10 12/13/2021   AST 12 12/13/2021   NA 137 12/13/2021   K 3.8 12/13/2021   CL 99 12/13/2021   CREATININE 0.68 12/13/2021   BUN 16  12/13/2021   CO2 28 12/13/2021   TSH 1.62 12/13/2021       Assessment & Plan:   Problem List Items Addressed This Visit     Adnexal mass    S/p ovarian cystectomy and unilateral salpingectomy.  Doing well s/p surgery.  Follow.        Anxiety    Overall appears to be doing well.  Follow.       Hair loss    Has noticed increased hair loss recently.  Check labs, including cbc and thyroid function.       Relevant Orders   CBC with Differential/Platelet (Completed)   Comprehensive metabolic panel (Completed)   TSH (Completed)   Healthcare maintenance    Physical today 12/13/21.  Sees gyn for breast, pelvic and pap smears        SVT (supraventricular tachycardia) ([facility])    Has a history of SVT.  Currently stable.  Follow.       Other Visit Diagnoses     Routine general medical examination at a health care facility    -  Primary        Einar Pheasant, MD

## 2021-12-14 ENCOUNTER — Other Ambulatory Visit (INDEPENDENT_AMBULATORY_CARE_PROVIDER_SITE_OTHER): Payer: BC Managed Care – PPO

## 2021-12-14 ENCOUNTER — Other Ambulatory Visit: Payer: Self-pay | Admitting: Internal Medicine

## 2021-12-14 ENCOUNTER — Encounter: Payer: Self-pay | Admitting: Internal Medicine

## 2021-12-14 DIAGNOSIS — R718 Other abnormality of red blood cells: Secondary | ICD-10-CM | POA: Diagnosis not present

## 2021-12-14 LAB — COMPREHENSIVE METABOLIC PANEL
ALT: 10 U/L (ref 0–35)
AST: 12 U/L (ref 0–37)
Albumin: 4.7 g/dL (ref 3.5–5.2)
Alkaline Phosphatase: 76 U/L (ref 39–117)
BUN: 16 mg/dL (ref 6–23)
CO2: 28 mEq/L (ref 19–32)
Calcium: 9.8 mg/dL (ref 8.4–10.5)
Chloride: 99 mEq/L (ref 96–112)
Creatinine, Ser: 0.68 mg/dL (ref 0.40–1.20)
GFR: 117.68 mL/min (ref >60.00)
Glucose, Bld: 87 mg/dL (ref 70–99)
Potassium: 3.8 mEq/L (ref 3.5–5.1)
Sodium: 137 mEq/L (ref 135–145)
Total Bilirubin: 0.2 mg/dL (ref 0.2–1.2)
Total Protein: 7.5 g/dL (ref 6.0–8.3)

## 2021-12-14 LAB — IBC + FERRITIN
Ferritin: 13.6 ng/mL (ref 10.0–291.0)
Iron: 57 ug/dL (ref 42–145)
Saturation Ratios: 13.1 % — ABNORMAL LOW (ref 20.0–50.0)
TIBC: 434 ug/dL (ref 250.0–450.0)
Transferrin: 310 mg/dL (ref 212.0–360.0)

## 2021-12-14 LAB — CBC WITH DIFFERENTIAL/PLATELET
Basophils Absolute: 0 10*3/uL (ref 0.0–0.1)
Basophils Relative: 0.5 % (ref 0.0–3.0)
Eosinophils Absolute: 0.2 10*3/uL (ref 0.0–0.7)
Eosinophils Relative: 2.9 % (ref 0.0–5.0)
HCT: 38.1 % (ref 36.0–46.0)
Hemoglobin: 12.5 g/dL (ref 12.0–15.0)
Lymphocytes Relative: 26.3 % (ref 12.0–46.0)
Lymphs Abs: 2.2 10*3/uL (ref 0.7–4.0)
MCHC: 32.9 g/dL (ref 30.0–36.0)
MCV: 77.6 fl — ABNORMAL LOW (ref 78.0–100.0)
Monocytes Absolute: 0.7 10*3/uL (ref 0.1–1.0)
Monocytes Relative: 8.5 % (ref 3.0–12.0)
Neutro Abs: 5.3 10*3/uL (ref 1.4–7.7)
Neutrophils Relative %: 61.8 % (ref 43.0–77.0)
Platelets: 395 10*3/uL (ref 150.0–400.0)
RBC: 4.91 Mil/uL (ref 3.87–5.11)
RDW: 14.4 % (ref 11.5–15.5)
WBC: 8.5 10*3/uL (ref 4.0–10.5)

## 2021-12-14 LAB — TSH: TSH: 1.62 u[IU]/mL (ref 0.35–5.50)

## 2021-12-14 NOTE — Progress Notes (Signed)
Order placed for add on lab.  °

## 2021-12-15 NOTE — Assessment & Plan Note (Signed)
S/p ovarian cystectomy and unilateral salpingectomy.  Doing well s/p surgery.  Follow.

## 2021-12-15 NOTE — Assessment & Plan Note (Signed)
Has a history of SVT.  Currently stable.  Follow.  

## 2021-12-15 NOTE — Assessment & Plan Note (Signed)
Overall appears to be doing well.  Follow.  

## 2021-12-15 NOTE — Assessment & Plan Note (Signed)
Has noticed increased hair loss recently.  Check labs, including cbc and thyroid function.

## 2021-12-15 NOTE — Assessment & Plan Note (Signed)
Physical today 12/13/21.  Sees gyn for breast, pelvic and pap smears

## 2022-01-03 ENCOUNTER — Encounter: Payer: Self-pay | Admitting: Obstetrics and Gynecology

## 2022-01-16 ENCOUNTER — Encounter: Payer: Self-pay | Admitting: Obstetrics and Gynecology

## 2022-01-28 ENCOUNTER — Other Ambulatory Visit: Payer: Self-pay

## 2022-01-28 DIAGNOSIS — N39498 Other specified urinary incontinence: Secondary | ICD-10-CM

## 2022-04-30 ENCOUNTER — Ambulatory Visit: Payer: BC Managed Care – PPO

## 2022-05-21 ENCOUNTER — Encounter: Payer: Self-pay | Admitting: Obstetrics and Gynecology

## 2022-05-21 ENCOUNTER — Encounter: Payer: Self-pay | Admitting: Internal Medicine

## 2022-05-21 ENCOUNTER — Other Ambulatory Visit (HOSPITAL_COMMUNITY)
Admission: RE | Admit: 2022-05-21 | Discharge: 2022-05-21 | Disposition: A | Discharge status: Home / Self Care | Payer: BC Managed Care – PPO | Source: Ambulatory Visit | Attending: Obstetrics and Gynecology | Admitting: Obstetrics and Gynecology

## 2022-05-21 ENCOUNTER — Ambulatory Visit (INDEPENDENT_AMBULATORY_CARE_PROVIDER_SITE_OTHER): Payer: BC Managed Care – PPO | Admitting: Obstetrics and Gynecology

## 2022-05-21 VITALS — BP 124/81 | HR 98 | Ht 62.0 in | Wt 142.7 lb

## 2022-05-21 DIAGNOSIS — Z124 Encounter for screening for malignant neoplasm of cervix: Secondary | ICD-10-CM | POA: Diagnosis not present

## 2022-05-21 DIAGNOSIS — N898 Other specified noninflammatory disorders of vagina: Secondary | ICD-10-CM

## 2022-05-21 DIAGNOSIS — Z01419 Encounter for gynecological examination (general) (routine) without abnormal findings: Secondary | ICD-10-CM

## 2022-05-21 NOTE — Progress Notes (Signed)
Patients presents for annual exam today. She states her menstrual cycles have returned heavier than before pregnancy, still uninterested in birth control at this time. Patient states she is still experiencing anxiety, goes to therapy routinely. Patient is due for pap smear, ordered. Patient states no other questions or concerns at this time.

## 2022-05-21 NOTE — Addendum Note (Signed)
Addended by: Douglass Rivers R on: 05/21/2022 10:08 AM   Modules accepted: Orders

## 2022-05-21 NOTE — Progress Notes (Signed)
HPI:      [name] is a 31 y.o. G1P1001 who LMP was Patient's last menstrual period was 05/08/2022 (exact date).  Subjective:   She presents today for her annual examination.  She continues to complain of significant increase in clear vaginal discharge.  The discharge is otherwise asymptomatic.  Denies new sexual partners. She has discontinued breast-feeding.  Does not desire birth control.  Plans to use condoms.    Hx: The following portions of the patient's history were reviewed and updated as appropriate:             She  has a past medical history of Anxiety and SVT (supraventricular tachycardia) ([place]). She does not have any pertinent problems on file. She  has a past surgical history that includes Ablation (2012); Laparoscopic ovarian cystectomy (Left, 11/09/2021); and Laparoscopic unilateral salpingectomy (11/09/2021). Her family history includes Breast cancer in her maternal grandmother; Heart Problems in her father; Hypertension in her mother; Prostate cancer in her maternal grandfather. She  reports that she has never smoked. She has never used smokeless tobacco. She reports that she does not currently use alcohol. She reports that she does not use drugs. She has a current medication list which includes the following prescription(s): prenatal vitamin w/fe, fa. She is allergic to sulfamethoxazole-trimethoprim.       Review of Systems:  Review of Systems  Constitutional: Denied constitutional symptoms, night sweats, recent illness, fatigue, fever, insomnia and weight loss.  Eyes: Denied eye symptoms, eye pain, photophobia, vision change and visual disturbance.  Ears/Nose/Throat/Neck: Denied ear, nose, throat or neck symptoms, hearing loss, nasal discharge, sinus congestion and sore throat.  Cardiovascular: Denied cardiovascular symptoms, arrhythmia, chest pain/pressure, edema, exercise intolerance, orthopnea and palpitations.  Respiratory: Denied pulmonary symptoms, asthma,  pleuritic pain, productive sputum, cough, dyspnea and wheezing.  Gastrointestinal: Denied, gastro-esophageal reflux, melena, nausea and vomiting.  Genitourinary: See HPI for additional information.  Musculoskeletal: Denied musculoskeletal symptoms, stiffness, swelling, muscle weakness and myalgia.  Dermatologic: Denied dermatology symptoms, rash and scar.  Neurologic: Denied neurology symptoms, dizziness, headache, neck pain and syncope.  Psychiatric: Denied psychiatric symptoms, anxiety and depression.  Endocrine: Denied endocrine symptoms including hot flashes and night sweats.   Meds:   Current Outpatient Medications on File Prior to Visit  Medication Sig Dispense Refill   prenatal vitamin w/FE, FA (NATACHEW) 29-1 MG CHEW chewable tablet Chew 1 tablet by mouth daily at 12 noon.     No current facility-administered medications on file prior to visit.     Objective:     There were no vitals filed for this visit.  Filed Weights   05/21/22 0910  Weight: 142 lb 11.2 oz (64.7 kg)              Physical examination General NAD, Conversant  HEENT Atraumatic; Op clear with mmm.  Normo-cephalic. Pupils reactive. Anicteric sclerae  Thyroid/Neck Smooth without nodularity or enlargement. Normal ROM.  Neck Supple.  Skin No rashes, lesions or ulceration. Normal palpated skin turgor. No nodularity.  Breasts: No masses or discharge.  Symmetric.  No axillary adenopathy.  Lungs: Clear to auscultation.No rales or wheezes. Normal Respiratory effort, no retractions.  Heart: NSR.  No murmurs or rubs appreciated. No periferal edema  Abdomen: Soft.  Non-tender.  No masses.  No HSM. No hernia  Extremities: Moves all appropriately.  Normal ROM for age. No lymphadenopathy.  Neuro: Oriented to PPT.  Normal mood. Normal affect.     Pelvic:   Vulva: Normal appearance.  No lesions.  Vagina: No lesions or abnormalities noted.  Support: Normal pelvic support.  Urethra No masses tenderness or scarring.   Meatus Normal size without lesions or prolapse.  Cervix: Normal appearance.  No lesions.  Copious amount of clear discharge from the cervix.  Similar to midcycle discharge.  Anus: Normal exam.  No lesions.  Perineum: Normal exam.  No lesions.        Bimanual   Uterus: Normal size.  Non-tender.  Mobile.  AV.  Adnexae: No masses.  Non-tender to palpation.  Cul-de-sac: Negative for abnormality.     Assessment:    G1P1001 Patient Active Problem List   Diagnosis Date Noted   Hair loss 12/13/2021   Gestational hypertension 08/28/2021   Group B streptococcal carriage complicating pregnancy 62/37/6283   Adnexal mass 03/23/2021   Bee sting 02/20/2020   Lymphadenopathy 02/20/2020   Healthcare maintenance 12/11/2017   Back pain 12/11/2017   SVT (supraventricular tachycardia) ([place]) 12/01/2016   Anxiety 12/01/2016   Menstrual cramps 12/01/2016   Hx of prior ablation treatment 06/26/2013     1. Well woman exam with routine gynecological exam   2. Cervical cancer screening   3. Vaginal discharge     Vaginal discharge seems like cervical mucus but much greater than usual.   Plan:            1.  Basic Screening Recommendations The basic screening recommendations for asymptomatic women were discussed with the patient during her visit.  The age-appropriate recommendations were discussed with her and the rational for the tests reviewed.  When I am informed by the patient that another primary care physician has previously obtained the age-appropriate tests and they are up-to-date, only outstanding tests are ordered and referrals given as necessary.  Abnormal results of tests will be discussed with her when all of her results are completed.  Routine preventative health maintenance measures emphasized: Exercise/Diet/Weight control, Tobacco Warnings, Alcohol/Substance use risks and Stress Management Pap performed 2.  Nusw[place] for BV and yeast 3.  Recommend ultrasound with attention to cervix and  fallopian tubes for vaginal discharge.  Orders Orders Placed This Encounter  Procedures   US PELVIS TRANSVAGINAL NON-OB (TV ONLY)   US PELVIS (TRANSABDOMINAL ONLY)    No orders of the defined types were placed in this encounter.         F/U  No follow-ups on file.  Finis Bud, M.D. 05/21/2022 10:03 AM

## 2022-05-22 LAB — CERVICOVAGINAL ANCILLARY ONLY
Bacterial Vaginitis (gardnerella): NEGATIVE
Candida Glabrata: NEGATIVE
Candida Vaginitis: NEGATIVE
Comment: NEGATIVE
Comment: NEGATIVE
Comment: NEGATIVE

## 2022-05-28 ENCOUNTER — Ambulatory Visit: Payer: BC Managed Care – PPO

## 2022-05-30 LAB — CYTOLOGY - PAP: Adequacy: ABNORMAL

## 2022-06-05 ENCOUNTER — Other Ambulatory Visit: Payer: BC Managed Care – PPO

## 2022-06-06 ENCOUNTER — Other Ambulatory Visit: Payer: BC Managed Care – PPO

## 2022-06-12 ENCOUNTER — Other Ambulatory Visit: Payer: BC Managed Care – PPO

## 2022-06-13 ENCOUNTER — Ambulatory Visit: Payer: BC Managed Care – PPO | Admitting: Internal Medicine

## 2022-06-13 ENCOUNTER — Encounter: Payer: Self-pay | Admitting: Internal Medicine

## 2022-06-13 DIAGNOSIS — N393 Stress incontinence (female) (male): Secondary | ICD-10-CM | POA: Diagnosis not present

## 2022-06-13 DIAGNOSIS — F419 Anxiety disorder, unspecified: Secondary | ICD-10-CM

## 2022-06-13 DIAGNOSIS — N898 Other specified noninflammatory disorders of vagina: Secondary | ICD-10-CM | POA: Diagnosis not present

## 2022-06-13 DIAGNOSIS — I471 Supraventricular tachycardia: Secondary | ICD-10-CM

## 2022-06-13 MED ORDER — CLONAZEPAM 0.5 MG PO TABS: ORAL_TABLET | ORAL | 0 refills | Status: DC

## 2022-06-13 NOTE — Progress Notes (Signed)
Patient ID: Sara Richardson, female   DOB: October 04, 1991, 31 y.o.   MRN: [id number]   Subjective:    Patient ID: Sara Richardson, female    DOB: [birth year], 31 y.o.   MRN: [id number]   Patient here for follow up appt  HPI Reports she has been doing relatively well.  Has seen gyn recently (05/21/22) for follow up.  PAP - insufficient cells.  Planning for f/u pap in 3 months.  She has also been having problems with increased vaginal discharge.  Planning for pelvici ultrasound 06/26/22.  She also reports problems with bladder leakage.  Notices when working out, coughing, sneezing, etc.  Noticed after childbirth.  Unable to go to PT right now - due to cost.  Discussed kegel exercises.  Otherwise doing relatively well.  No nausea or vomiting reported.  No abdominal pain.  Bowels moving.    Past Medical History:  Diagnosis Date   Anxiety    SVT (supraventricular tachycardia) ([place])    no problem since last ablation in 2012   Past Surgical History:  Procedure Laterality Date   ABLATION  2012   x2 heart ablation   LAPAROSCOPIC OVARIAN CYSTECTOMY Left 11/09/2021   Procedure: LAPAROSCOPIC OVARIAN CYSTECTOMY;  Surgeon: Harlin Heys, MD;  Location: [facility];  Service: Gynecology;  Laterality: Left;   LAPAROSCOPIC UNILATERAL SALPINGECTOMY  11/09/2021   Procedure: LAPAROSCOPIC LEFT PARTIAL SALPINGECTOMY;  Surgeon: Harlin Heys, MD;  Location: [facility];  Service: Gynecology;;   Family History  Problem Relation Age of Onset   Breast cancer Maternal Grandmother        lung cancer    Prostate cancer Maternal Grandfather    Hypertension Mother    Heart Problems Father    Social History   Socioeconomic History   Marital status: Married    Spouse name: Haze Boyden   Number of children: Not on file   Years of education: Not on file   Highest education level: Not on file  Occupational History   Occupation: Pharmacist, hospital    CommentSports administrator - second grade  Tobacco Use   Smoking status: Never    Smokeless tobacco: Never  Vaping Use   Vaping Use: Never used  Substance and Sexual Activity   Alcohol use: Not Currently   Drug use: No   Sexual activity: Yes    Partners: Male    Birth control/protection: Condom  Other Topics Concern   Not on file  Social History Narrative   Not on file   Social Determinants of Health   Financial Resource Strain: Not on file  Food Insecurity: Not on file  Transportation Needs: Not on file  Physical Activity: Not on file  Stress: Not on file  Social Connections: Not on file     Review of Systems  Constitutional:  Negative for appetite change and unexpected weight change.  HENT:  Negative for congestion and sinus pressure.   Respiratory:  Negative for cough, chest tightness and shortness of breath.   Cardiovascular:  Negative for chest pain, palpitations and leg swelling.  Gastrointestinal:  Negative for abdominal pain, diarrhea, nausea and vomiting.  Genitourinary:  Negative for difficulty urinating and dysuria.       Urinary leakage as outlined.   Musculoskeletal:  Negative for joint swelling and myalgias.  Skin:  Negative for color change and rash.  Neurological:  Negative for dizziness, light-headedness and headaches.  Psychiatric/Behavioral:  Negative for agitation and dysphoric mood.        Objective:  BP 118/76 (BP Location: Left Arm, Patient Position: Sitting, Cuff Size: Small)   Pulse 71   Temp 98 F (36.7 C) (Temporal)   Resp 15   Ht '5\' 2"'$  (1.575 m)   Wt 141 lb 8 oz (64.2 kg)   LMP 05/08/2022 (Exact Date)   SpO2 100%   BMI 25.88 kg/m  Wt Readings from Last 3 Encounters:  06/13/22 141 lb 8 oz (64.2 kg)  05/21/22 142 lb 11.2 oz (64.7 kg)  12/13/21 149 lb 6.4 oz (67.8 kg)    Physical Exam Vitals reviewed.  Constitutional:      General: She is not in acute distress.    Appearance: Normal appearance.  HENT:     Head: Normocephalic and atraumatic.     Right Ear: External ear normal.     Left Ear: External ear  normal.  Eyes:     General: No scleral icterus.       Right eye: No discharge.        Left eye: No discharge.     Conjunctiva/sclera: Conjunctivae normal.  Neck:     Thyroid: No thyromegaly.  Cardiovascular:     Rate and Rhythm: Normal rate and regular rhythm.  Pulmonary:     Effort: No respiratory distress.     Breath sounds: Normal breath sounds. No wheezing.  Abdominal:     General: Bowel sounds are normal.     Palpations: Abdomen is soft.     Tenderness: There is no abdominal tenderness.  Genitourinary:    Comments: Not performed.  Just performed by gyn.  Musculoskeletal:        General: No swelling or tenderness.     Cervical back: Neck supple. No tenderness.  Lymphadenopathy:     Cervical: No cervical adenopathy.  Skin:    Findings: No erythema or rash.  Neurological:     Mental Status: She is alert.  Psychiatric:        Mood and Affect: Mood normal.        Behavior: Behavior normal.      Outpatient Encounter Medications as of 06/13/2022  Medication Sig   clonazePAM (KLONOPIN) 0.5 MG tablet 1/2 tablet q day prn   prenatal vitamin w/FE, FA (NATACHEW) 29-1 MG CHEW chewable tablet Chew 1 tablet by mouth daily at 12 noon.   No facility-administered encounter medications on file as of 06/13/2022.     Lab Results  Component Value Date   WBC 8.5 12/13/2021   HGB 12.5 12/13/2021   HCT 38.1 12/13/2021   PLT 395.0 12/13/2021   GLUCOSE 87 12/13/2021   ALT 10 12/13/2021   AST 12 12/13/2021   NA 137 12/13/2021   K 3.8 12/13/2021   CL 99 12/13/2021   CREATININE 0.68 12/13/2021   BUN 16 12/13/2021   CO2 28 12/13/2021   TSH 1.62 12/13/2021       Assessment & Plan:   Problem List Items Addressed This Visit     Anxiety    Overall doing well.  Handling stress.  On no medication.  Follow.       SVT (supraventricular tachycardia) ([place])    Has a history of SVT.  Currently stable.  Follow.       Urinary incontinence    Describes leakage with exercise, coughing,  sneezing, etc.  Discussed pelvic wall/vaginal/bladder muscle weakness.  Discussed kegel exercise.  Discussed referral to PT for pelvic floor therapy.  Cost is an issue.  See if can contact therapy for exercises.  Follow.  Seeing gyn.  Planning for ultrasound.       Vaginal discharge    Saw Dr Amalia Hailey (05/21/22) - planning ultrasound. Scheduled for 06/26/22.         Einar Pheasant, MD

## 2022-06-23 ENCOUNTER — Encounter: Payer: Self-pay | Admitting: Internal Medicine

## 2022-06-23 DIAGNOSIS — R32 Unspecified urinary incontinence: Secondary | ICD-10-CM | POA: Insufficient documentation

## 2022-06-23 NOTE — Assessment & Plan Note (Signed)
Overall doing well.  Handling stress.  On no medication.  Follow.

## 2022-06-23 NOTE — Assessment & Plan Note (Signed)
Saw Dr Amalia Hailey (05/21/22) - planning ultrasound. Scheduled for 06/26/22.

## 2022-06-23 NOTE — Assessment & Plan Note (Signed)
Describes leakage with exercise, coughing, sneezing, etc.  Discussed pelvic wall/vaginal/bladder muscle weakness.  Discussed kegel exercise.  Discussed referral to PT for pelvic floor therapy.  Cost is an issue.  See if can contact therapy for exercises.  Follow.  Seeing gyn.  Planning for ultrasound.

## 2022-06-23 NOTE — Assessment & Plan Note (Signed)
Has a history of SVT.  Currently stable.  Follow.

## 2022-06-26 ENCOUNTER — Other Ambulatory Visit: Payer: BC Managed Care – PPO

## 2022-07-08 ENCOUNTER — Other Ambulatory Visit: Payer: BC Managed Care – PPO

## 2022-07-12 ENCOUNTER — Ambulatory Visit (INDEPENDENT_AMBULATORY_CARE_PROVIDER_SITE_OTHER): Payer: BC Managed Care – PPO

## 2022-07-12 DIAGNOSIS — N898 Other specified noninflammatory disorders of vagina: Secondary | ICD-10-CM

## 2022-08-11 ENCOUNTER — Encounter: Payer: Self-pay | Admitting: Internal Medicine

## 2022-08-13 NOTE — Telephone Encounter (Signed)
See if she can do a visit at 7:00 Wednesday to discuss.  Ok if virtual.

## 2022-08-13 NOTE — Telephone Encounter (Signed)
Pt scheduled for tomorrow at 7 am. She will call by end of day if she cannot get off of work for appointment.

## 2022-08-14 ENCOUNTER — Ambulatory Visit (INDEPENDENT_AMBULATORY_CARE_PROVIDER_SITE_OTHER): Payer: BC Managed Care – PPO | Admitting: Internal Medicine

## 2022-08-14 DIAGNOSIS — F419 Anxiety disorder, unspecified: Secondary | ICD-10-CM

## 2022-08-14 NOTE — Progress Notes (Unsigned)
Patient ID: Sara Richardson, female   DOB: [birth year], 31 y.o.   MRN: [id number]   Subjective:    Patient ID: Sara Richardson, female    DOB: 1991-03-05, 31 y.o.   MRN: [id number]   Patient here for  Chief Complaint  Patient presents with   Follow-up    Discuss flight anxiety   .   HPI    Past Medical History:  Diagnosis Date   Anxiety    SVT (supraventricular tachycardia) ([place])    no problem since last ablation in 2012   Past Surgical History:  Procedure Laterality Date   ABLATION  2012   x2 heart ablation   LAPAROSCOPIC OVARIAN CYSTECTOMY Left 11/09/2021   Procedure: LAPAROSCOPIC OVARIAN CYSTECTOMY;  Surgeon: Harlin Heys, MD;  Location: [facility];  Service: Gynecology;  Laterality: Left;   LAPAROSCOPIC UNILATERAL SALPINGECTOMY  11/09/2021   Procedure: LAPAROSCOPIC LEFT PARTIAL SALPINGECTOMY;  Surgeon: Harlin Heys, MD;  Location: [facility];  Service: Gynecology;;   Family History  Problem Relation Age of Onset   Breast cancer Maternal Grandmother        lung cancer    Prostate cancer Maternal Grandfather    Hypertension Mother    Heart Problems Father    Social History   Socioeconomic History   Marital status: Married    Spouse name: Haze Boyden   Number of children: Not on file   Years of education: Not on file   Highest education level: Not on file  Occupational History   Occupation: Pharmacist, hospital    CommentSports administrator - second grade  Tobacco Use   Smoking status: Never   Smokeless tobacco: Never  Vaping Use   Vaping Use: Never used  Substance and Sexual Activity   Alcohol use: Not Currently   Drug use: No   Sexual activity: Yes    Partners: Male    Birth control/protection: Condom  Other Topics Concern   Not on file  Social History Narrative   Not on file   Social Determinants of Health   Financial Resource Strain: Not on file  Food Insecurity: Not on file  Transportation Needs: Not on file  Physical Activity: Not on file  Stress: Not on file   Social Connections: Not on file     Review of Systems     Objective:     There were no vitals taken for this visit. Wt Readings from Last 3 Encounters:  06/13/22 141 lb 8 oz (64.2 kg)  05/21/22 142 lb 11.2 oz (64.7 kg)  12/13/21 149 lb 6.4 oz (67.8 kg)    Physical Exam   Outpatient Encounter Medications as of 08/14/2022  Medication Sig   clonazePAM (KLONOPIN) 0.5 MG tablet 1/2 tablet q day prn   prenatal vitamin w/FE, FA (NATACHEW) 29-1 MG CHEW chewable tablet Chew 1 tablet by mouth daily at 12 noon.   No facility-administered encounter medications on file as of 08/14/2022.     Lab Results  Component Value Date   WBC 8.5 12/13/2021   HGB 12.5 12/13/2021   HCT 38.1 12/13/2021   PLT 395.0 12/13/2021   GLUCOSE 87 12/13/2021   ALT 10 12/13/2021   AST 12 12/13/2021   NA 137 12/13/2021   K 3.8 12/13/2021   CL 99 12/13/2021   CREATININE 0.68 12/13/2021   BUN 16 12/13/2021   CO2 28 12/13/2021   TSH 1.62 12/13/2021       Assessment & Plan:   Problem List Items Addressed This Visit  None    Einar Pheasant, MD

## 2022-08-14 NOTE — Telephone Encounter (Signed)
Patient had an appointment with Dr Nicki Reaper this morning that needed to be canceled. Patient is unable to come in and see another provider today or do a virtual. She already has the medication for flying on Friday. She just would like to know when she should take medication. Please see the MyChart below.

## 2022-08-15 ENCOUNTER — Encounter: Payer: Self-pay | Admitting: Internal Medicine

## 2022-08-15 NOTE — Telephone Encounter (Signed)
Stopped breast feeding in July and she say she is sure she is not pregnant, patient is agreeable to something just for nausea. Please advise when she should take the clonazepam

## 2022-08-15 NOTE — Telephone Encounter (Signed)
Please schedule her for a virtual visit tomorrow.  Thanks.

## 2022-08-15 NOTE — Progress Notes (Signed)
Patient ID: Sara Richardson, female   DOB: [birth year], 31 y.o.   MRN: [id number] Appt was changed (canceled).

## 2022-08-16 MED ORDER — ONDANSETRON HCL 4 MG PO TABS: 4.0000 mg | ORAL_TABLET | Freq: Two times a day (BID) | ORAL | 0 refills | Status: DC | PRN

## 2022-08-16 NOTE — Telephone Encounter (Signed)
Called Sara Richardson. Discussed more specifics with her.  Discussed using clonazepam and directions.  Concern regarding headache and nausea that may accompany flying.  Agrees to take clonazepam as directed.  Will have zofran to take if develops nausea.  Rx sent in for zofran.

## 2022-08-16 NOTE — Addendum Note (Signed)
Addended by: Alisa Graff on: 08/16/2022 01:26 PM   Modules accepted: Orders

## 2022-09-11 ENCOUNTER — Ambulatory Visit: Payer: BC Managed Care – PPO

## 2022-09-19 ENCOUNTER — Other Ambulatory Visit (HOSPITAL_COMMUNITY)
Admission: RE | Admit: 2022-09-19 | Discharge: 2022-09-19 | Disposition: A | Discharge status: Home / Self Care | Payer: BC Managed Care – PPO | Source: Ambulatory Visit | Attending: Obstetrics and Gynecology | Admitting: Obstetrics and Gynecology

## 2022-09-19 ENCOUNTER — Encounter: Payer: Self-pay | Admitting: Obstetrics and Gynecology

## 2022-09-19 ENCOUNTER — Ambulatory Visit (INDEPENDENT_AMBULATORY_CARE_PROVIDER_SITE_OTHER): Payer: BC Managed Care – PPO | Admitting: Obstetrics and Gynecology

## 2022-09-19 VITALS — BP 132/76 | HR 97 | Ht 62.0 in | Wt 142.2 lb

## 2022-09-19 DIAGNOSIS — Z124 Encounter for screening for malignant neoplasm of cervix: Secondary | ICD-10-CM

## 2022-09-19 DIAGNOSIS — Z3202 Encounter for pregnancy test, result negative: Secondary | ICD-10-CM | POA: Diagnosis not present

## 2022-09-19 DIAGNOSIS — Z3200 Encounter for pregnancy test, result unknown: Secondary | ICD-10-CM

## 2022-09-19 LAB — POCT URINE PREGNANCY: Preg Test, Ur: NEGATIVE

## 2022-09-19 NOTE — Progress Notes (Signed)
HPI:      [name] is a 31 y.o. G1P1001 who LMP was Patient's last menstrual period was 08/29/2022 (exact date).  Subjective:   She presents today for repeat Pap smear.  Her last Pap smear had inadequate cells.  She is not having any problems.    Hx: The following portions of the patient's history were reviewed and updated as appropriate:             She  has a past medical history of Anxiety and SVT (supraventricular tachycardia). She does not have any pertinent problems on file. She  has a past surgical history that includes Ablation (2012); Laparoscopic ovarian cystectomy (Left, 11/09/2021); and Laparoscopic unilateral salpingectomy (11/09/2021). Her family history includes Breast cancer in her maternal grandmother; Heart Problems in her father; Hypertension in her mother; Prostate cancer in her maternal grandfather. She  reports that she has never smoked. She has never used smokeless tobacco. She reports that she does not currently use alcohol. She reports that she does not use drugs. She has a current medication list which includes the following prescription(s): clonazepam and ondansetron. She is allergic to sulfamethoxazole-trimethoprim.       Review of Systems:  Review of Systems  Constitutional: Denied constitutional symptoms, night sweats, recent illness, fatigue, fever, insomnia and weight loss.  Eyes: Denied eye symptoms, eye pain, photophobia, vision change and visual disturbance.  Ears/Nose/Throat/Neck: Denied ear, nose, throat or neck symptoms, hearing loss, nasal discharge, sinus congestion and sore throat.  Cardiovascular: Denied cardiovascular symptoms, arrhythmia, chest pain/pressure, edema, exercise intolerance, orthopnea and palpitations.  Respiratory: Denied pulmonary symptoms, asthma, pleuritic pain, productive sputum, cough, dyspnea and wheezing.  Gastrointestinal: Denied, gastro-esophageal reflux, melena, nausea and vomiting.  Genitourinary: Denied  genitourinary symptoms including symptomatic vaginal discharge, pelvic relaxation issues, and urinary complaints.  Musculoskeletal: Denied musculoskeletal symptoms, stiffness, swelling, muscle weakness and myalgia.  Dermatologic: Denied dermatology symptoms, rash and scar.  Neurologic: Denied neurology symptoms, dizziness, headache, neck pain and syncope.  Psychiatric: Denied psychiatric symptoms, anxiety and depression.  Endocrine: Denied endocrine symptoms including hot flashes and night sweats.   Meds:   Current Outpatient Medications on File Prior to Visit  Medication Sig Dispense Refill   clonazePAM (KLONOPIN) 0.5 MG tablet 1/2 tablet q day prn 20 tablet 0   ondansetron (ZOFRAN) 4 MG tablet Take 1 tablet (4 mg total) by mouth 2 (two) times daily as needed for nausea or vomiting. 10 tablet 0   No current facility-administered medications on file prior to visit.      Objective:     Vitals:   09/19/22 1527  BP: 132/76  Pulse: 97   Filed Weights   09/19/22 1527  Weight: 142 lb 3.2 oz (64.5 kg)              Physical examination   Pelvic:   Vulva: Normal appearance.  No lesions.  Vagina: No lesions or abnormalities noted.  Support: Normal pelvic support.  Urethra No masses tenderness or scarring.  Meatus Normal size without lesions or prolapse.  Cervix: Normal appearance.  No lesions.  Large friable cervix  Anus: Normal exam.  No lesions.  Perineum: Normal exam.  No lesions.        Bimanual   Uterus: Normal size.  Non-tender.  Mobile.  AV.  Adnexae: No masses.  Non-tender to palpation.  Cul-de-sac: Negative for abnormality.             Assessment:    G1P1001 Patient Active Problem List  Diagnosis Date Noted   Urinary incontinence 06/23/2022   Vaginal discharge 05/21/2022   Hair loss 12/13/2021   Gestational hypertension 08/28/2021   Group B streptococcal carriage complicating pregnancy 50/27/7412   Adnexal mass 03/23/2021   Bee sting 02/20/2020    Lymphadenopathy 02/20/2020   Healthcare maintenance 12/11/2017   Back pain 12/11/2017   SVT (supraventricular tachycardia) 12/01/2016   Anxiety 12/01/2016   Menstrual cramps 12/01/2016   Hx of prior ablation treatment 06/26/2013     1. Cervical cancer screening   2. Possible pregnancy, not yet confirmed        Plan:            1.  Pap Co-test performed Orders Orders Placed This Encounter  Procedures   POCT urine pregnancy    No orders of the defined types were placed in this encounter.     F/U  Return for Annual Physical. I spent 12 minutes involved in the care of this patient preparing to see the patient by obtaining and reviewing her medical history (including labs, imaging tests and prior procedures), documenting clinical information in the electronic health record (EHR), counseling and coordinating care plans, writing and sending prescriptions, ordering tests or procedures and in direct communicating with the patient and medical staff discussing pertinent items from her history and physical exam.  Finis Bud, M.D. 09/19/2022 4:14 PM

## 2022-09-19 NOTE — Progress Notes (Signed)
Patient presents for pap smear only today. She reports some irregular spotting and nausea, requesting in-office UPT. UPT resulted in negative. No additional concerns.

## 2022-09-26 LAB — CYTOLOGY - PAP
Comment: NEGATIVE
Diagnosis: NEGATIVE
Diagnosis: REACTIVE
High risk HPV: NEGATIVE

## 2022-10-02 ENCOUNTER — Ambulatory Visit: Payer: BC Managed Care – PPO | Admitting: Internal Medicine

## 2022-10-10 ENCOUNTER — Ambulatory Visit: Payer: BC Managed Care – PPO | Admitting: Family Medicine

## 2022-10-10 ENCOUNTER — Encounter: Payer: Self-pay | Admitting: Family Medicine

## 2022-10-10 VITALS — BP 126/74 | HR 99 | Temp 97.8°F | Ht 62.0 in | Wt 140.8 lb

## 2022-10-10 DIAGNOSIS — J0140 Acute pansinusitis, unspecified: Secondary | ICD-10-CM

## 2022-10-10 DIAGNOSIS — J329 Chronic sinusitis, unspecified: Secondary | ICD-10-CM | POA: Insufficient documentation

## 2022-10-10 MED ORDER — DOXYCYCLINE HYCLATE 100 MG PO TABS: 100.0000 mg | ORAL_TABLET | Freq: Two times a day (BID) | ORAL | 0 refills | Status: DC

## 2022-10-10 MED ORDER — GUAIFENESIN-CODEINE 100-10 MG/5ML PO SOLN: 10.0000 mL | Freq: Four times a day (QID) | ORAL | 0 refills | Status: DC | PRN

## 2022-10-10 NOTE — Assessment & Plan Note (Addendum)
Suspect bacterial sinusitis following a viral infection.  Will treat with doxycycline 100 mg twice daily for 7 days.  She can also use codeine/guaifenesin cough syrup.  Discussed risk of drowsiness with this.  Advised not to drive if she does get drowsy.  Discussed the risk of skin sensitivity to the sun with the doxycycline.  Discussed the risk of diarrhea with antibiotics.  Discussed it could take 3 to 4 days before she notices a big difference in her symptoms.  If not improving she will let us know.

## 2022-10-10 NOTE — Progress Notes (Signed)
Tommi Rumps, MD Phone: [phone]  Sara Richardson is a 31 y.o. female who presents today for same day visit.   Respiratory infection: Patient had symptoms started 2 weeks ago.  Her son had RSV.  She initially had some cough, body aches, chills, and sore throat.  More recently she has developed some sinus congestion with yellow mucus coming from her nose.  She still has cough that is intermittently productive.  Notes some shortness of breath if she has to wear a mask.  No taste or smell disturbances.  No more chills or bodyaches at this time.  No known COVID exposure or flu exposure.  She has been taking NyQuil and DayQuil intermittently.  Reports she had a rash and itching with treatment for GBS when she was pregnant.  Notes LMP was 2 weeks ago.  She is no longer breast-feeding.  Social History   Tobacco Use  Smoking Status Never  Smokeless Tobacco Never    No current outpatient medications on file prior to visit.   No current facility-administered medications on file prior to visit.     ROS see history of present illness  Objective  Physical Exam Vitals:   10/10/22 0943 10/10/22 0955  BP: 126/74   Pulse: (!) 122 99  Temp: 97.8 F (36.6 C)   SpO2: 98%     BP Readings from Last 3 Encounters:  10/10/22 126/74  09/19/22 132/76  06/13/22 118/76   Wt Readings from Last 3 Encounters:  10/10/22 140 lb 12.8 oz (63.9 kg)  09/19/22 142 lb 3.2 oz (64.5 kg)  06/13/22 141 lb 8 oz (64.2 kg)    Physical Exam Constitutional:      General: She is not in acute distress.    Appearance: She is not diaphoretic.  HENT:     Left Ear: Tympanic membrane normal.     Ears:     Comments: Right TM is obscured by cerumen, CMA irrigated this and the patient got a little swimmy headed after a few tries irrigation so this was discontinued, there was a small amount of wax that came out, the ear canal is definitely more clear though there is still some wax obscuring the tympanic membrane     Mouth/Throat:     Mouth: Mucous membranes are moist.     Pharynx: Oropharynx is clear.  Cardiovascular:     Rate and Rhythm: Normal rate and regular rhythm.     Heart sounds: Normal heart sounds.  Pulmonary:     Effort: Pulmonary effort is normal.     Breath sounds: Normal breath sounds.  Lymphadenopathy:     Cervical: No cervical adenopathy.  Skin:    General: Skin is warm and dry.  Neurological:     Mental Status: She is alert.      Assessment/Plan: Please see individual problem list.  Problem List Items Addressed This Visit     Sinusitis - Primary    Suspect bacterial sinusitis following a viral infection.  Will treat with doxycycline 100 mg twice daily for 7 days.  She can also use codeine/guaifenesin cough syrup.  Discussed risk of drowsiness with this.  Advised not to drive if she does get drowsy.  Discussed the risk of skin sensitivity to the sun with the doxycycline.  Discussed the risk of diarrhea with antibiotics.  If not improving she will let us know.      Relevant Medications   doxycycline (VIBRA-TABS) 100 MG tablet   guaiFENesin-codeine 100-10 MG/5ML syrup  Return if symptoms worsen or fail to improve.   Tommi Rumps, MD [place]

## 2022-10-31 ENCOUNTER — Encounter: Payer: Self-pay | Admitting: Internal Medicine

## 2022-11-01 ENCOUNTER — Telehealth: Payer: Self-pay | Admitting: Internal Medicine

## 2022-11-01 NOTE — Telephone Encounter (Signed)
Pt called stating she saw sonnenberg for a sick visit and he flushed her ears out while she was there but pt stated her right right ear is still clogged

## 2022-11-01 NOTE — Telephone Encounter (Signed)
She will need to be rechecked to look in her ear.

## 2022-11-01 NOTE — Telephone Encounter (Signed)
Please advise.  Sara Richardson,cma

## 2022-11-01 NOTE — Telephone Encounter (Signed)
I called the patient and she is scheduled to see provider next week. Darya Bigler,cma

## 2022-11-13 ENCOUNTER — Ambulatory Visit: Payer: BC Managed Care – PPO | Admitting: Family Medicine

## 2022-11-13 ENCOUNTER — Encounter: Payer: Self-pay | Admitting: Family Medicine

## 2022-11-13 VITALS — BP 90/60 | HR 109 | Temp 98.3°F | Ht 62.0 in | Wt 140.2 lb

## 2022-11-13 DIAGNOSIS — H6120 Impacted cerumen, unspecified ear: Secondary | ICD-10-CM | POA: Insufficient documentation

## 2022-11-13 DIAGNOSIS — H6121 Impacted cerumen, right ear: Secondary | ICD-10-CM | POA: Diagnosis not present

## 2022-11-13 NOTE — Assessment & Plan Note (Addendum)
This was irrigated by CMA and tolerated well by patient.  Normal TM was revealed.  She will monitor for recurrence. She can try debrox in the future if she feels this is recurring.

## 2022-11-13 NOTE — Progress Notes (Signed)
  Tommi Rumps, MD Phone: [phone]  Sara Richardson is a 32 y.o. female who presents today for same-day.  Right ear fullness: Patient notes her right ear feels clogged up.  Notes it has not changed really since I last saw her in November.  She notes occasionally some discomfort in the right ear.  No tinnitus.  Her hearing is a little muffled though not extreme.  No vertigo.  Social History   Tobacco Use  Smoking Status Never  Smokeless Tobacco Never    Current Outpatient Medications on File Prior to Visit  Medication Sig Dispense Refill   doxycycline (VIBRA-TABS) 100 MG tablet Take 1 tablet (100 mg total) by mouth 2 (two) times daily. (Patient not taking: Reported on 11/13/2022) 14 tablet 0   guaiFENesin-codeine 100-10 MG/5ML syrup Take 10 mLs by mouth every 6 (six) hours as needed for cough. (Patient not taking: Reported on 11/13/2022) 120 mL 0   No current facility-administered medications on file prior to visit.     ROS see history of present illness  Objective  Physical Exam Vitals:   11/13/22 1525  BP: 90/60  Pulse: (!) 109  Temp: 98.3 F (36.8 C)  SpO2: 99%    BP Readings from Last 3 Encounters:  11/13/22 90/60  10/10/22 126/74  09/19/22 132/76   Wt Readings from Last 3 Encounters:  11/13/22 140 lb 3.2 oz (63.6 kg)  10/10/22 140 lb 12.8 oz (63.9 kg)  09/19/22 142 lb 3.2 oz (64.5 kg)    Physical Exam HENT:     Left Ear: Tympanic membrane normal.     Ears:     Comments: Right TM obscured by cerumen     Assessment/Plan: Please see individual problem list.  Impacted cerumen of right ear Assessment & Plan: This was irrigated by CMA and tolerated well by patient.  Normal TM was revealed.  She will monitor for recurrence. She can try debrox in the future if she feels this is recurring.      Return if symptoms worsen or fail to improve.   Tommi Rumps, MD [place]

## 2023-01-14 ENCOUNTER — Encounter: Payer: Self-pay | Admitting: Internal Medicine

## 2023-01-30 ENCOUNTER — Telehealth: Payer: Self-pay

## 2023-01-30 ENCOUNTER — Ambulatory Visit (INDEPENDENT_AMBULATORY_CARE_PROVIDER_SITE_OTHER): Payer: BC Managed Care – PPO | Admitting: Nurse Practitioner

## 2023-01-30 ENCOUNTER — Encounter: Payer: Self-pay | Admitting: Nurse Practitioner

## 2023-01-30 VITALS — BP 122/78 | HR 102 | Temp 98.4°F | Ht 62.0 in | Wt 134.6 lb

## 2023-01-30 DIAGNOSIS — G43909 Migraine, unspecified, not intractable, without status migrainosus: Secondary | ICD-10-CM | POA: Insufficient documentation

## 2023-01-30 DIAGNOSIS — G43809 Other migraine, not intractable, without status migrainosus: Secondary | ICD-10-CM | POA: Diagnosis not present

## 2023-01-30 MED ORDER — ONDANSETRON HCL 4 MG PO TABS: 4.0000 mg | ORAL_TABLET | Freq: Three times a day (TID) | ORAL | 0 refills | Status: DC | PRN

## 2023-01-30 NOTE — Telephone Encounter (Signed)
Medication Samples have been provided to the patient.  Drug name: Nurtec       Strength: 75 mg        Qty: 2  [id number]:[id number]  Exp.Date: 01/2025   Dosing instructions: Place 1 tablet under tongue and dissolve every other day  The patient has been instructed regarding the correct time, dose, and frequency of taking this medication, including desired effects and most common side effects.   Real Cons Dacey Milberger 10:00 AM 01/30/2023

## 2023-01-30 NOTE — Patient Instructions (Addendum)
Sample of Nurtec- 75 mg provided.

## 2023-01-30 NOTE — Progress Notes (Signed)
Established Patient Office Visit  Subjective:  Patient ID: Sara Richardson, female    DOB: [birth year]  Age: 32 y.o. MRN: [id number]  CC:  Chief Complaint  Patient presents with   Headache    Migraine since 01/28/23 Nauseated    HPI  Sara Richardson presents for migraine headche with nausea.  She states that the headache started 3 days ago.  She is taking Tylenol and ibuprofen without significant relief.  She states that yesterday night her headache was 10/10.    She is taking zofran. She complaints of pain on the forehead and back of the head.  She states that the headache is better right now.  She will rate the pain as 3 /10 at present. Her last LMP was 01/18/23.  HPI   Past Medical History:  Diagnosis Date   Anxiety    SVT (supraventricular tachycardia)    no problem since last ablation in 2012    Past Surgical History:  Procedure Laterality Date   ABLATION  2012   x2 heart ablation   LAPAROSCOPIC OVARIAN CYSTECTOMY Left 11/09/2021   Procedure: LAPAROSCOPIC OVARIAN CYSTECTOMY;  Surgeon: Harlin Heys, MD;  Location: [facility] OR[facility];  Service: Gynecology;  Laterality: Left;   LAPAROSCOPIC UNILATERAL SALPINGECTOMY  11/09/2021   Procedure: LAPAROSCOPIC LEFT PARTIAL SALPINGECTOMY;  Surgeon: Harlin Heys, MD;  Location: [facility] OR[facility];  Service: Gynecology;;    Family History  Problem Relation Age of Onset   Breast cancer Maternal Grandmother        lung cancer    Prostate cancer Maternal Grandfather    Hypertension Mother    Heart Problems Father     Social History   Socioeconomic History   Marital status: Married    Spouse name: Haze Boyden   Number of children: Not on file   Years of education: Not on file   Highest education level: Not on file  Occupational History   Occupation: Pharmacist, hospital    CommentSports administrator - second grade  Tobacco Use   Smoking status: Never   Smokeless tobacco: Never  Vaping Use   Vaping Use: Never used  Substance and Sexual Activity    Alcohol use: Not Currently   Drug use: No   Sexual activity: Yes    Partners: Male    Birth control/protection: Condom  Other Topics Concern   Not on file  Social History Narrative   Not on file   Social Determinants of Health   Financial Resource Strain: Not on file  Food Insecurity: Not on file  Transportation Needs: Not on file  Physical Activity: Not on file  Stress: Not on file  Social Connections: Not on file  Intimate Partner Violence: Not on file     Outpatient Medications Prior to Visit  Medication Sig Dispense Refill   doxycycline (VIBRA-TABS) 100 MG tablet Take 1 tablet (100 mg total) by mouth 2 (two) times daily. (Patient not taking: Reported on 11/13/2022) 14 tablet 0   guaiFENesin-codeine 100-10 MG/5ML syrup Take 10 mLs by mouth every 6 (six) hours as needed for cough. (Patient not taking: Reported on 11/13/2022) 120 mL 0   No facility-administered medications prior to visit.    Allergies  Allergen Reactions   Sulfamethoxazole-Trimethoprim Hives and Itching   Ampicillin Itching    ROS Review of Systems  Constitutional: Negative.   Respiratory: Negative.    Cardiovascular: Negative.   Gastrointestinal:  Positive for nausea.  Neurological:  Positive for headaches.  Psychiatric/Behavioral: Negative.  Objective:    Physical Exam Constitutional:      Appearance: She is well-developed and normal weight.  HENT:     Head:     Comments: Pressure in the middle of forehead. Eyes:     Extraocular Movements: Extraocular movements intact.     Pupils: Pupils are equal, round, and reactive to light.  Cardiovascular:     Rate and Rhythm: Normal rate and regular rhythm.  Pulmonary:     Effort: Pulmonary effort is normal.     Breath sounds: Normal breath sounds. No stridor. No wheezing.  Abdominal:     Palpations: Abdomen is soft.  Neurological:     Mental Status: She is alert and oriented to person, place, and time. Mental status is at baseline.      Cranial Nerves: No dysarthria or facial asymmetry.     Sensory: No sensory deficit.     Motor: No weakness.     Coordination: Coordination normal.  Psychiatric:        Mood and Affect: Mood normal.        Speech: Speech normal.        Behavior: Behavior normal.     BP 122/78   Pulse (!) 102   Temp 98.4 F (36.9 C)   Ht 5\' 2"  (1.575 m)   Wt 134 lb 9.6 oz (61.1 kg)   SpO2 99%   BMI 24.62 kg/m  Wt Readings from Last 3 Encounters:  02/04/23 137 lb 4.8 oz (62.3 kg)  01/30/23 134 lb 9.6 oz (61.1 kg)  11/13/22 140 lb 3.2 oz (63.6 kg)     Health Maintenance  Topic Date Due   INFLUENZA VACCINE  02/09/2023 (Originally 06/11/2022)   COVID-19 Vaccine (1) 02/15/2023 (Originally 01/08/1993)   HPV VACCINES (2 - 3-dose series) 06/14/2023 (Originally 11/29/2013)   PAP SMEAR-Modifier  09/19/2025   DTaP/Tdap/Td (2 - Td or Tdap) 06/14/2031   Hepatitis C Screening  Completed   HIV Screening  Completed    There are no preventive care reminders to display for this patient.  Lab Results  Component Value Date   TSH 1.62 12/13/2021   Lab Results  Component Value Date   WBC 8.5 12/13/2021   HGB 12.5 12/13/2021   HCT 38.1 12/13/2021   MCV 77.6 (L) 12/13/2021   PLT 395.0 12/13/2021   Lab Results  Component Value Date   NA 137 12/13/2021   K 3.8 12/13/2021   CO2 28 12/13/2021   GLUCOSE 87 12/13/2021   BUN 16 12/13/2021   CREATININE 0.68 12/13/2021   BILITOT 0.2 12/13/2021   ALKPHOS 76 12/13/2021   AST 12 12/13/2021   ALT 10 12/13/2021   PROT 7.5 12/13/2021   ALBUMIN 4.7 12/13/2021   CALCIUM 9.8 12/13/2021   ANIONGAP 10 05/17/2019   GFR 117.68 12/13/2021   No results found for: "CHOL" No results found for: "HDL" No results found for: "LDLCALC" No results found for: "TRIG" No results found for: "CHOLHDL" No results found for: "HGBA1C"    Assessment & Plan:  Other migraine without status migrainosus, not intractable Assessment & Plan: Rx Zofran sent to the  pharmacy. Sample of Nurtec 75 mg provided to the patient for migraine headache. Advised patient to place 1 tablet on the tongue. Advised her to call the office if symptoms does not improve.   Other orders -     Ondansetron HCl; Take 1 tablet (4 mg total) by mouth every 8 (eight) hours as needed for nausea or vomiting.  Dispense:  20 tablet; Refill: 0 -     SUMAtriptan Succinate; Take 1 tablet (50 mg total) by mouth 2 (two) times daily as needed for migraine. May repeat in 2 hours if headache persists or recurs.  Dispense: 30 tablet; Refill: 0    Follow-up: No follow-ups on file.   Theresia Lo, NP

## 2023-01-31 MED ORDER — SUMATRIPTAN SUCCINATE 50 MG PO TABS: 50.0000 mg | ORAL_TABLET | Freq: Two times a day (BID) | ORAL | 0 refills | Status: DC | PRN

## 2023-01-31 NOTE — Telephone Encounter (Signed)
Patient saw you yesterday for migraine. Is there anything else you recommended for her?

## 2023-01-31 NOTE — Telephone Encounter (Signed)
Pt is aware.  

## 2023-01-31 NOTE — Telephone Encounter (Signed)
Pt called stating she is still not feeling well and she want something better. She is still having the migraine and is nauseous after she has taken the medication

## 2023-01-31 NOTE — Telephone Encounter (Signed)
Have sent sumatriptan 50 mg can use twice a day for acute migraine

## 2023-02-03 ENCOUNTER — Ambulatory Visit: Payer: BC Managed Care – PPO | Admitting: Internal Medicine

## 2023-02-04 ENCOUNTER — Ambulatory Visit (INDEPENDENT_AMBULATORY_CARE_PROVIDER_SITE_OTHER): Payer: BC Managed Care – PPO

## 2023-02-04 VITALS — BP 139/72 | HR 85 | Wt 137.3 lb

## 2023-02-04 DIAGNOSIS — Z3202 Encounter for pregnancy test, result negative: Secondary | ICD-10-CM | POA: Diagnosis not present

## 2023-02-04 DIAGNOSIS — Z3200 Encounter for pregnancy test, result unknown: Secondary | ICD-10-CM

## 2023-02-04 DIAGNOSIS — R399 Unspecified symptoms and signs involving the genitourinary system: Secondary | ICD-10-CM | POA: Diagnosis not present

## 2023-02-04 LAB — POCT URINALYSIS DIPSTICK
Bilirubin, UA: NEGATIVE
Blood, UA: NEGATIVE
Glucose, UA: NEGATIVE
Ketones, UA: NEGATIVE
Leukocytes, UA: NEGATIVE
Nitrite, UA: NEGATIVE
Protein, UA: NEGATIVE
Spec Grav, UA: 1.015 (ref 1.010–1.025)
Urobilinogen, UA: 1 E.U./dL
pH, UA: 6.5 (ref 5.0–8.0)

## 2023-02-04 LAB — POCT URINE PREGNANCY: Preg Test, Ur: NEGATIVE

## 2023-02-04 NOTE — Progress Notes (Signed)
UTI Patient states starting 1 week ago  she has experienced dysuria, some burning not much, she also states she has leaked 4 days in a row. Urine culture has been sent.   If symptomatic, patient should schedule nurse visit for further evaluation.   PLAN: Advise patient to increase water intake and to add cranberry juice.  Recommended extra strength tylenol and AZO OTC (** if not pregnant)

## 2023-02-05 ENCOUNTER — Ambulatory Visit: Payer: BC Managed Care – PPO | Admitting: Nurse Practitioner

## 2023-02-06 LAB — URINE CULTURE

## 2023-02-08 ENCOUNTER — Encounter: Payer: Self-pay | Admitting: Obstetrics

## 2023-02-08 ENCOUNTER — Other Ambulatory Visit: Payer: Self-pay | Admitting: Obstetrics

## 2023-02-08 MED ORDER — NITROFURANTOIN MONOHYD MACRO 100 MG PO CAPS: 100.0000 mg | ORAL_CAPSULE | Freq: Two times a day (BID) | ORAL | 0 refills | Status: AC

## 2023-02-09 NOTE — Assessment & Plan Note (Signed)
Rx Zofran sent to the pharmacy. Sample of Nurtec 75 mg provided to the patient for migraine headache. Advised patient to place 1 tablet on the tongue. Advised her to call the office if symptoms does not improve.

## 2023-02-10 ENCOUNTER — Other Ambulatory Visit: Payer: Self-pay

## 2023-02-10 DIAGNOSIS — A499 Bacterial infection, unspecified: Secondary | ICD-10-CM

## 2023-02-10 MED ORDER — CIPROFLOXACIN HCL 250 MG PO TABS: 250.0000 mg | ORAL_TABLET | Freq: Two times a day (BID) | ORAL | 0 refills | Status: DC

## 2023-02-10 NOTE — Progress Notes (Unsigned)
New rx sent in due to history of reactions with previous medication

## 2023-02-10 NOTE — Telephone Encounter (Signed)
Taken care of

## 2023-02-18 ENCOUNTER — Ambulatory Visit: Payer: BC Managed Care – PPO | Admitting: Internal Medicine

## 2023-02-27 ENCOUNTER — Encounter: Payer: Self-pay | Admitting: Internal Medicine

## 2023-02-27 ENCOUNTER — Ambulatory Visit: Payer: BC Managed Care – PPO | Admitting: Internal Medicine

## 2023-02-27 VITALS — BP 100/60 | HR 103 | Temp 98.2°F | Resp 17 | Ht 62.0 in | Wt 134.4 lb

## 2023-02-27 DIAGNOSIS — F419 Anxiety disorder, unspecified: Secondary | ICD-10-CM

## 2023-02-27 DIAGNOSIS — I471 Supraventricular tachycardia, unspecified: Secondary | ICD-10-CM | POA: Diagnosis not present

## 2023-02-27 DIAGNOSIS — L84 Corns and callosities: Secondary | ICD-10-CM | POA: Diagnosis not present

## 2023-02-27 NOTE — Progress Notes (Signed)
Subjective:    Patient ID: Sara Richardson, female    DOB: May 14, 1991, 31 y.o.   MRN: [id number]  Patient here for  Chief Complaint  Patient presents with   Corneal Ulcer    On bottom of right foot under great toe-has been working on it at home. Getting some better but still hurts. Has been ongoing for about one year now.    HPI Work in appt - persistent "corn" - right great toe.  Has been present for approximately one year.  Has tried soaks, topical medication/pads.  Has scraped thickened skin from around the callous.  Has done this multiple times.  Still persistent callous.  Given persistence, discussed referral to podiatry.  States otherwise doing well.  Tries to stay active.  No chest pain or sob reported.  No abdominal pain or bowel change reported.     Past Medical History:  Diagnosis Date   Anxiety    SVT (supraventricular tachycardia)    no problem since last ablation in 2012   Past Surgical History:  Procedure Laterality Date   ABLATION  2012   x2 heart ablation   LAPAROSCOPIC OVARIAN CYSTECTOMY Left 11/09/2021   Procedure: LAPAROSCOPIC OVARIAN CYSTECTOMY;  Surgeon: Linzie Collin, MD;  Location: [facility] OR[facility];  Service: Gynecology;  Laterality: Left;   LAPAROSCOPIC UNILATERAL SALPINGECTOMY  11/09/2021   Procedure: LAPAROSCOPIC LEFT PARTIAL SALPINGECTOMY;  Surgeon: Linzie Collin, MD;  Location: [facility] OR[facility];  Service: Gynecology;;   Family History  Problem Relation Age of Onset   Breast cancer Maternal Grandmother        lung cancer    Prostate cancer Maternal Grandfather    Hypertension Mother    Heart Problems Father    Social History   Socioeconomic History   Marital status: Married    Spouse name: Rosalyn Gess   Number of children: Not on file   Years of education: Not on file   Highest education level: Not on file  Occupational History   Occupation: Runner, broadcasting/film/video    CommentHorticulturist, commercial - second grade  Tobacco Use   Smoking status: Never   Smokeless tobacco: Never   Vaping Use   Vaping Use: Never used  Substance and Sexual Activity   Alcohol use: Not Currently   Drug use: No   Sexual activity: Yes    Partners: Male    Birth control/protection: Condom  Other Topics Concern   Not on file  Social History Narrative   Not on file   Social Determinants of Health   Financial Resource Strain: Not on file  Food Insecurity: Not on file  Transportation Needs: Not on file  Physical Activity: Not on file  Stress: Not on file  Social Connections: Not on file     Review of Systems  Constitutional:  Negative for appetite change and unexpected weight change.  HENT:  Negative for congestion and sinus pressure.   Respiratory:  Negative for cough, chest tightness and shortness of breath.   Cardiovascular:  Negative for chest pain and palpitations.  Gastrointestinal:  Negative for abdominal pain, diarrhea, nausea and vomiting.  Genitourinary:  Negative for difficulty urinating and dysuria.  Musculoskeletal:  Negative for joint swelling and myalgias.  Skin:  Negative for color change and rash.       Callous - right great toe.    Neurological:  Negative for dizziness and headaches.  Psychiatric/Behavioral:  Negative for agitation and dysphoric mood.        Objective:     BP  100/60   Pulse (!) 103   Temp 98.2 F (36.8 C) (Oral)   Resp 17   Ht  (1.575 m)   Wt 134 lb 6 oz (61 kg)   LMP 02/14/2023 (Approximate)   SpO2 98%   BMI 24.58 kg/m  Wt Readings from Last 3 Encounters:  02/27/23 134 lb 6 oz (61 kg)  02/04/23 137 lb 4.8 oz (62.3 kg)  01/30/23 134 lb 9.6 oz (61.1 kg)    Physical Exam   Outpatient Encounter Medications as of 02/27/2023  Medication Sig   [DISCONTINUED] ciprofloxacin (CIPRO) 250 MG tablet Take 1 tablet (250 mg total) by mouth 2 (two) times daily.   [DISCONTINUED] ondansetron (ZOFRAN) 4 MG tablet Take 1 tablet (4 mg total) by mouth every 8 (eight) hours as needed for nausea or vomiting. (Patient not taking: Reported  on 02/27/2023)   [DISCONTINUED] SUMAtriptan (IMITREX) 50 MG tablet Take 1 tablet (50 mg total) by mouth 2 (two) times daily as needed for migraine. May repeat in 2 hours if headache persists or recurs. (Patient not taking: Reported on 02/27/2023)   No facility-administered encounter medications on file as of 02/27/2023.     Lab Results  Component Value Date   WBC 8.5 12/13/2021   HGB 12.5 12/13/2021   HCT 38.1 12/13/2021   PLT 395.0 12/13/2021   GLUCOSE 87 12/13/2021   ALT 10 12/13/2021   AST 12 12/13/2021   NA 137 12/13/2021   K 3.8 12/13/2021   CL 99 12/13/2021   CREATININE 0.68 12/13/2021   BUN 16 12/13/2021   CO2 28 12/13/2021   TSH 1.62 12/13/2021    No results found.     Assessment & Plan:  Corn of toe Assessment & Plan: Persistent callous of right great toe.  Has tried soaks, topical medication/pads.  Has scraped thickened skin from around the callous.  Has done this multiple times.  Still persistent callous.  Given persistence, discussed referral to podiatry.   Orders: -     Ambulatory referral to Podiatry  Anxiety Assessment & Plan: Overall doing well.  Handling stress.  On no medication.  Follow.    SVT (supraventricular tachycardia) Assessment & Plan: Has a history of SVT.  Currently stable.  Follow.       [place], MD

## 2023-03-02 ENCOUNTER — Encounter: Payer: Self-pay | Admitting: Internal Medicine

## 2023-03-02 NOTE — Assessment & Plan Note (Signed)
Has a history of SVT.  Currently stable.  Follow.  

## 2023-03-02 NOTE — Assessment & Plan Note (Signed)
Overall doing well.  Handling stress.  On no medication.  Follow.  

## 2023-03-02 NOTE — Assessment & Plan Note (Signed)
Persistent callous of right great toe.  Has tried soaks, topical medication/pads.  Has scraped thickened skin from around the callous.  Has done this multiple times.  Still persistent callous.  Given persistence, discussed referral to podiatry.

## 2023-03-23 ENCOUNTER — Ambulatory Visit
Admission: EM | Admit: 2023-03-23 | Discharge: 2023-03-23 | Disposition: A | Discharge status: Home / Self Care | Payer: BC Managed Care – PPO | Attending: Urgent Care | Admitting: Urgent Care

## 2023-03-23 DIAGNOSIS — R109 Unspecified abdominal pain: Secondary | ICD-10-CM | POA: Diagnosis not present

## 2023-03-23 DIAGNOSIS — R399 Unspecified symptoms and signs involving the genitourinary system: Secondary | ICD-10-CM | POA: Diagnosis not present

## 2023-03-23 DIAGNOSIS — R197 Diarrhea, unspecified: Secondary | ICD-10-CM | POA: Insufficient documentation

## 2023-03-23 LAB — POCT URINALYSIS DIP (MANUAL ENTRY)
Bilirubin, UA: NEGATIVE
Blood, UA: NEGATIVE
Glucose, UA: NEGATIVE mg/dL
Ketones, POC UA: NEGATIVE mg/dL
Leukocytes, UA: NEGATIVE
Nitrite, UA: NEGATIVE
Protein Ur, POC: NEGATIVE mg/dL
Spec Grav, UA: 1.015 (ref 1.010–1.025)
Urobilinogen, UA: 0.2 E.U./dL
pH, UA: 7 (ref 5.0–8.0)

## 2023-03-23 NOTE — ED Provider Notes (Signed)
Renaldo Fiddler    CSN: [id number] Arrival date & time: 03/23/23  0908      History   Chief Complaint Chief Complaint  Patient presents with   Urinary Frequency   Abdominal Cramping    HPI Sara Richardson is a 32 y.o. female.    Urinary Frequency  Abdominal Cramping    Since urgent care with complaint of urinary frequency x 2 days.  She endorses diarrhea and abdominal cramping (suprapubic) starting yesterday.  Expresses concern for UTI versus BV.  Not using OTC medication for symptom relief.  She endorses baseline "heavy vaginal discharge" that has been evaluated in the past and is considered "normal".  This is since the birth of her son per patient.  Past Medical History:  Diagnosis Date   Anxiety    SVT (supraventricular tachycardia)    no problem since last ablation in 2012    Patient Active Problem List   Diagnosis Date Noted   Corn of toe 02/27/2023   Migraine 01/30/2023   Cerumen impaction 11/13/2022   Sinusitis 10/10/2022   Urinary incontinence 06/23/2022   Vaginal discharge 05/21/2022   Hair loss 12/13/2021   Gestational hypertension 08/28/2021   Group B streptococcal carriage complicating pregnancy 08/08/2021   Adnexal mass 03/23/2021   Bee sting 02/20/2020   Lymphadenopathy 02/20/2020   Healthcare maintenance 12/11/2017   Back pain 12/11/2017   SVT (supraventricular tachycardia) 12/01/2016   Anxiety 12/01/2016   Menstrual cramps 12/01/2016   Hx of prior ablation treatment 06/26/2013    Past Surgical History:  Procedure Laterality Date   ABLATION  2012   x2 heart ablation   LAPAROSCOPIC OVARIAN CYSTECTOMY Left 11/09/2021   Procedure: LAPAROSCOPIC OVARIAN CYSTECTOMY;  Surgeon: Linzie Collin, MD;  Location: [facility];  Service: Gynecology;  Laterality: Left;   LAPAROSCOPIC UNILATERAL SALPINGECTOMY  11/09/2021   Procedure: LAPAROSCOPIC LEFT PARTIAL SALPINGECTOMY;  Surgeon: Linzie Collin, MD;  Location: [facility];  Service:  Gynecology;;    OB History     Gravida  1   Para  1   Term  1   Preterm  0   AB  0   Living  1      SAB  0   IAB  0   Ectopic  0   Multiple  0   Live Births  1            Home Medications    Prior to Admission medications   Not on File    Family History Family History  Problem Relation Age of Onset   Breast cancer Maternal Grandmother        lung cancer    Prostate cancer Maternal Grandfather    Hypertension Mother    Heart Problems Father     Social History Social History   Tobacco Use   Smoking status: Never   Smokeless tobacco: Never  Vaping Use   Vaping Use: Never used  Substance Use Topics   Alcohol use: Not Currently   Drug use: No     Allergies   Sulfamethoxazole-trimethoprim and Ampicillin   Review of Systems Review of Systems  Genitourinary:  Positive for frequency.     Physical Exam Triage Vital Signs ED Triage Vitals  Enc Vitals Group     BP 03/23/23 1008 121/81     Pulse Rate 03/23/23 1008 80     Resp 03/23/23 1008 16     Temp 03/23/23 1008 98.4 F (36.9 C)  Temp Source 03/23/23 1008 Oral     SpO2 03/23/23 1008 98 %     Weight --      Height --      Head Circumference --      Peak Flow --      Pain Score 03/23/23 1007 0     Pain Loc --      Pain Edu? --      Excl. in GC? --    No data found.  Updated Vital Signs BP 121/81 (BP Location: Left Arm)   Pulse 80   Temp 98.4 F (36.9 C) (Oral)   Resp 16   LMP 03/10/2023 (Approximate)   SpO2 98%   Breastfeeding No   Visual Acuity Right Eye Distance:   Left Eye Distance:   Bilateral Distance:    Right Eye Near:   Left Eye Near:    Bilateral Near:     Physical Exam Vitals reviewed.  Constitutional:      Appearance: Normal appearance.  Skin:    General: Skin is warm and dry.  Neurological:     General: No focal deficit present.     Mental Status: She is alert and oriented to person, place, and time.  Psychiatric:        Mood and Affect:  Mood normal.        Behavior: Behavior normal.      UC Treatments / Results  Labs (all labs ordered are listed, but only abnormal results are displayed) Labs Reviewed  POCT URINALYSIS DIP (MANUAL ENTRY)    EKG   Radiology No results found.  Procedures Procedures (including critical care time)  Medications Ordered in UC Medications - No data to display  Initial Impression / Assessment and Plan / UC Course  I have reviewed the triage vital signs and the nursing notes.  Pertinent labs & imaging results that were available during my care of the patient were reviewed by me and considered in my medical decision making (see chart for details).   UA result is not indicative of UTI, is WNL.  Informed patient that results of vaginal swab was pending and she would be contacted if there was need for treatment.   Final Clinical Impressions(s) / UC Diagnoses   Final diagnoses:  None   Discharge Instructions   None    ED Prescriptions   None    PDMP not reviewed this encounter.   Charma Igo, [place] 03/23/23 1025

## 2023-03-23 NOTE — Discharge Instructions (Signed)
Follow up here or with your primary care provider if your symptoms are worsening or not improving.     

## 2023-03-23 NOTE — ED Triage Notes (Signed)
Patient presents to [facility] for urinary urgency since Friday. Diarrhea and abdominal cramping since yesterday. Concerned with UTI and BV. No OTC meds for symptom relief.

## 2023-03-24 LAB — CERVICOVAGINAL ANCILLARY ONLY
Bacterial Vaginitis (gardnerella): NEGATIVE
Candida Glabrata: NEGATIVE
Candida Vaginitis: NEGATIVE
Comment: NEGATIVE
Comment: NEGATIVE
Comment: NEGATIVE

## 2023-03-28 ENCOUNTER — Ambulatory Visit: Payer: BC Managed Care – PPO

## 2023-03-28 ENCOUNTER — Ambulatory Visit (INDEPENDENT_AMBULATORY_CARE_PROVIDER_SITE_OTHER): Payer: BC Managed Care – PPO

## 2023-03-28 VITALS — BP 126/87 | HR 88 | Ht 62.0 in | Wt 136.0 lb

## 2023-03-28 DIAGNOSIS — R300 Dysuria: Secondary | ICD-10-CM | POA: Diagnosis not present

## 2023-03-28 DIAGNOSIS — R350 Frequency of micturition: Secondary | ICD-10-CM | POA: Diagnosis not present

## 2023-03-28 DIAGNOSIS — R3915 Urgency of urination: Secondary | ICD-10-CM | POA: Diagnosis not present

## 2023-03-28 LAB — POCT URINALYSIS DIPSTICK
Bilirubin, UA: NEGATIVE
Blood, UA: NEGATIVE
Glucose, UA: NEGATIVE
Ketones, UA: NEGATIVE
Leukocytes, UA: NEGATIVE
Nitrite, UA: NEGATIVE
Protein, UA: NEGATIVE
Spec Grav, UA: 1.01 (ref 1.010–1.025)
Urobilinogen, UA: 1 E.U./dL
pH, UA: 7 (ref 5.0–8.0)

## 2023-03-28 NOTE — Progress Notes (Signed)
    NURSE VISIT NOTE  Subjective:    Patient ID: Sara Richardson, female    DOB: [birth year], 32 y.o.   MRN: [id number]       HPI  Patient is a 32 y.o. G67P1001 female who presents for dysuria and urinary urgency for 7 day.  Patient denies genital rash and genital irritation.  Patient does have a history of recurrent UTI.  Patient does not have a history of pyelonephritis.    Objective:    BP 126/87   Pulse 88   Ht 5\' 2"  (1.575 m)   Wt 136 lb (61.7 kg)   LMP 03/10/2023 (Approximate)   BMI 24.87 kg/m    Lab Review  No results found for any visits on 03/28/23.  Assessment:   1. Dysuria   2. Urine frequency   3. Urgency of urination      Plan:   Urine Culture Sent.   Cornelius Moras, CMA

## 2023-03-28 NOTE — Patient Instructions (Signed)
Urinary Tract Infection, Adult A urinary tract infection (UTI) is an infection of any part of the urinary tract. The urinary tract includes: The kidneys. The ureters. The bladder. The urethra. These organs make, store, and get rid of pee (urine) in the body. What are the causes? This infection is caused by germs (bacteria) in your genital area. These germs grow and cause swelling (inflammation) of your urinary tract. What increases the risk? The following factors may make you more likely to develop this condition: Using a small, thin tube (catheter) to drain pee. Not being able to control when you pee or poop (incontinence). Being female. If you are female, these things can increase the risk: Using these methods to prevent pregnancy: A medicine that kills sperm (spermicide). A device that blocks sperm (diaphragm). Having low levels of a female hormone (estrogen). Being pregnant. You are more likely to develop this condition if: You have genes that add to your risk. You are sexually active. You take antibiotic medicines. You have trouble peeing because of: A prostate that is bigger than normal, if you are female. A blockage in the part of your body that drains pee from the bladder. A kidney stone. A nerve condition that affects your bladder. Not getting enough to drink. Not peeing often enough. You have other conditions, such as: Diabetes. A weak disease-fighting system (immune system). Sickle cell disease. Gout. Injury of the spine. What are the signs or symptoms? Symptoms of this condition include: Needing to pee right away. Peeing small amounts often. Pain or burning when peeing. Blood in the pee. Pee that smells bad or not like normal. Trouble peeing. Pee that is cloudy. Fluid coming from the vagina, if you are female. Pain in the belly or lower back. Other symptoms include: Vomiting. Not feeling hungry. Feeling mixed up (confused). This may be the first symptom in  older adults. Being tired and grouchy (irritable). A fever. Watery poop (diarrhea). How is this treated? Taking antibiotic medicine. Taking other medicines. Drinking enough water. In some cases, you may need to see a specialist. Follow these instructions at home:  Medicines Take over-the-counter and prescription medicines only as told by your doctor. If you were prescribed an antibiotic medicine, take it as told by your doctor. Do not stop taking it even if you start to feel better. General instructions Make sure you: Pee until your bladder is empty. Do not hold pee for a long time. Empty your bladder after sex. Wipe from front to back after peeing or pooping if you are a female. Use each tissue one time when you wipe. Drink enough fluid to keep your pee pale yellow. Keep all follow-up visits. Contact a doctor if: You do not get better after 1-2 days. Your symptoms go away and then come back. Get help right away if: You have very bad back pain. You have very bad pain in your lower belly. You have a fever. You have chills. You feeling like you will vomit or you vomit. Summary A urinary tract infection (UTI) is an infection of any part of the urinary tract. This condition is caused by germs in your genital area. There are many risk factors for a UTI. Treatment includes antibiotic medicines. Drink enough fluid to keep your pee pale yellow. This information is not intended to replace advice given to you by your health care provider. Make sure you discuss any questions you have with your health care provider. Document Revised: 06/09/2020 Document Reviewed: 06/09/2020 Elsevier Patient Education    2023 Elsevier Inc.  

## 2023-03-31 ENCOUNTER — Encounter: Payer: Self-pay | Admitting: Obstetrics and Gynecology

## 2023-03-31 ENCOUNTER — Other Ambulatory Visit: Payer: Self-pay | Admitting: Obstetrics and Gynecology

## 2023-03-31 ENCOUNTER — Other Ambulatory Visit: Payer: Self-pay | Admitting: Certified Nurse Midwife

## 2023-03-31 LAB — URINE CULTURE

## 2023-03-31 MED ORDER — AMOXICILLIN 500 MG PO CAPS: 500.0000 mg | ORAL_CAPSULE | Freq: Three times a day (TID) | ORAL | 0 refills | Status: DC

## 2023-03-31 MED ORDER — CIPROFLOXACIN HCL 250 MG PO TABS: 250.0000 mg | ORAL_TABLET | Freq: Two times a day (BID) | ORAL | 0 refills | Status: AC

## 2023-04-01 ENCOUNTER — Ambulatory Visit: Payer: BC Managed Care – PPO | Admitting: Podiatry

## 2023-04-01 ENCOUNTER — Ambulatory Visit (INDEPENDENT_AMBULATORY_CARE_PROVIDER_SITE_OTHER): Payer: BC Managed Care – PPO

## 2023-04-01 ENCOUNTER — Encounter: Payer: Self-pay | Admitting: Podiatry

## 2023-04-01 VITALS — BP 117/74 | HR 103

## 2023-04-01 DIAGNOSIS — B070 Plantar wart: Secondary | ICD-10-CM

## 2023-04-01 DIAGNOSIS — M2041 Other hammer toe(s) (acquired), right foot: Secondary | ICD-10-CM

## 2023-04-01 NOTE — Progress Notes (Signed)
   Chief Complaint  Patient presents with   Toe Pain    "I have a corn on my toe." N - corn pain L - plantar hallux rt D - 1 yr O - intermitten C - sore A - walking, exercising T - medicated corn pad, my primary doctor said it was a corn and suggested that.    Subjective: 32 y.o. female presenting to the office today for evaluation of a symptomatic skin lesion to the plantar aspect of the right great toe present for about a year.  She has been applying corn callus remover with minimal improvement.  Aggravated by walking and exercising.  Presenting for further treatment and evaluation   Past Medical History:  Diagnosis Date   Anxiety    SVT (supraventricular tachycardia)    no problem since last ablation in 2012    Past Surgical History:  Procedure Laterality Date   ABLATION  2012   x2 heart ablation   LAPAROSCOPIC OVARIAN CYSTECTOMY Left 11/09/2021   Procedure: LAPAROSCOPIC OVARIAN CYSTECTOMY;  Surgeon: Linzie Collin, MD;  Location: [facility];  Service: Gynecology;  Laterality: Left;   LAPAROSCOPIC UNILATERAL SALPINGECTOMY  11/09/2021   Procedure: LAPAROSCOPIC LEFT PARTIAL SALPINGECTOMY;  Surgeon: Linzie Collin, MD;  Location: [facility];  Service: Gynecology;;    Allergies  Allergen Reactions   Sulfamethoxazole-Trimethoprim Hives and Itching   Ampicillin Itching   Macrobid [Nitrofurantoin]     Numbness and tingling in hands and fingers, eventually went away     Objective:  Physical Exam General: Alert and oriented x3 in no acute distress  Dermatology: Hyperkeratotic lesion(s) present on the plantar aspect of the right great toe. Pain on palpation with a central nucleated core noted. Skin is warm, dry and supple bilateral lower extremities. Negative for open lesions or macerations.  Vascular: Palpable pedal pulses bilaterally. No edema or erythema noted. Capillary refill within normal limits.  Neurological: Epicritic and protective threshold grossly intact  bilaterally.   Musculoskeletal Exam: Pain on palpation at the keratotic lesion(s) noted. Range of motion within normal limits bilateral. Muscle strength 5/5 in all groups bilateral.  Assessment: 1.  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 Felecia Shelling, DPM    [address] [place], [place] [address]                Office [phone]  Fax [phone]

## 2023-04-22 ENCOUNTER — Ambulatory Visit (INDEPENDENT_AMBULATORY_CARE_PROVIDER_SITE_OTHER): Payer: BC Managed Care – PPO | Admitting: Podiatry

## 2023-04-22 DIAGNOSIS — B070 Plantar wart: Secondary | ICD-10-CM | POA: Diagnosis not present

## 2023-04-22 NOTE — Progress Notes (Signed)
   Chief Complaint  Patient presents with   Callouses    keratoic lesion right hallux, follow-up,     Subjective: 32 y.o. female presenting to the office today for follow-up evaluation of a symptomatic skin lesion to the plantar aspect of the right great toe present for about a year.  Patient states that overall there is improvement.  She has been applying the salicylic acid daily as instructed.  She has noticed layers of skin peeling off of the lesion.  She presents for further treatment and evaluation   Past Medical History:  Diagnosis Date   Anxiety    SVT (supraventricular tachycardia)    no problem since last ablation in 2012    Past Surgical History:  Procedure Laterality Date   ABLATION  2012   x2 heart ablation   LAPAROSCOPIC OVARIAN CYSTECTOMY Left 11/09/2021   Procedure: LAPAROSCOPIC OVARIAN CYSTECTOMY;  Surgeon: Linzie Collin, MD;  Location: [facility];  Service: Gynecology;  Laterality: Left;   LAPAROSCOPIC UNILATERAL SALPINGECTOMY  11/09/2021   Procedure: LAPAROSCOPIC LEFT PARTIAL SALPINGECTOMY;  Surgeon: Linzie Collin, MD;  Location: [facility];  Service: Gynecology;;    Allergies  Allergen Reactions   Sulfamethoxazole-Trimethoprim Hives and Itching   Ampicillin Itching   Macrobid [Nitrofurantoin]     Numbness and tingling in hands and fingers, eventually went away     Objective:  Physical Exam General: Alert and oriented x3 in no acute distress  Dermatology: Hyperkeratotic lesion(s) present on the plantar aspect of the right great toe. Pain on palpation with a central nucleated core noted.  There is a skin around the area consistent with the patient's daily application of the salicylic acid.  Skin is warm, dry and supple bilateral lower extremities. Negative for open lesions or macerations.  Vascular: Palpable pedal pulses bilaterally. No edema or erythema noted. Capillary refill within normal limits.  Neurological: Grossly tact via light  touch  Musculoskeletal Exam: No pain or tenderness with palpation of the lesion  Assessment: 1.  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   -Continue salicylic acid daily x 1-2 additional weeks -Explained to the patient this may be more of a long-term maintenance type condition.  Advised against going barefoot.  Recommend salicylic acid as needed. 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 Felecia Shelling, DPM    [address] [place], [place] [address]                Office [phone]  Fax [phone]

## 2023-05-13 ENCOUNTER — Encounter: Payer: Self-pay | Admitting: Internal Medicine

## 2023-05-14 MED ORDER — CLONAZEPAM 0.5 MG PO TABS: ORAL_TABLET | ORAL | 0 refills | Status: DC

## 2023-05-14 NOTE — Telephone Encounter (Signed)
Patient is requesting a refill of her clonazepam. She uses 0.5 mg prn

## 2023-05-14 NOTE — Telephone Encounter (Signed)
Called and confirmed no concern regarding pregnancy.  Rx for clonazepam sent in to pharmacy

## 2023-05-14 NOTE — Telephone Encounter (Signed)
Rx ok'd for clonazepam.

## 2023-09-01 ENCOUNTER — Ambulatory Visit: Payer: BC Managed Care – PPO | Admitting: Internal Medicine

## 2023-09-09 ENCOUNTER — Ambulatory Visit: Payer: BC Managed Care – PPO | Admitting: Internal Medicine

## 2023-09-09 ENCOUNTER — Encounter: Payer: Self-pay | Admitting: Internal Medicine

## 2023-09-09 VITALS — BP 108/68 | HR 80 | Temp 97.9°F | Resp 16 | Ht 62.0 in | Wt 137.0 lb

## 2023-09-09 DIAGNOSIS — F419 Anxiety disorder, unspecified: Secondary | ICD-10-CM

## 2023-09-09 DIAGNOSIS — I471 Supraventricular tachycardia, unspecified: Secondary | ICD-10-CM

## 2023-09-09 DIAGNOSIS — T192XXA Foreign body in vulva and vagina, initial encounter: Secondary | ICD-10-CM | POA: Diagnosis not present

## 2023-09-09 DIAGNOSIS — O133 Gestational [pregnancy-induced] hypertension without significant proteinuria, third trimester: Secondary | ICD-10-CM

## 2023-09-09 DIAGNOSIS — R0602 Shortness of breath: Secondary | ICD-10-CM | POA: Diagnosis not present

## 2023-09-09 DIAGNOSIS — Z8759 Personal history of other complications of pregnancy, childbirth and the puerperium: Secondary | ICD-10-CM

## 2023-09-09 DIAGNOSIS — W448XXA Other foreign body entering into or through a natural orifice, initial encounter: Secondary | ICD-10-CM

## 2023-09-09 NOTE — Progress Notes (Signed)
Subjective:    Patient ID: Sara Richardson, female    DOB: 28-Mar-1991, 32 y.o.   MRN: [id number]  Patient here for  Chief Complaint  Patient presents with   Medical Management of Chronic Issues    HPI Here for a scheduled follow up.  Increased stress.  New job.  Working - [employer] - [employer]. Is doing some better with this stress and getting more used to the new job.  She has been trying to exercise.  Has noticed when exercising - has to "yawn".  Sometimes may feel like not getting a deep breath in. This is not necessarily associated with exercise.  No increased sob.  No increased cough. No abdominal pain or bowel change reported. No increased heart rate or palpitations.    Past Medical History:  Diagnosis Date   Anxiety    SVT (supraventricular tachycardia) (HCC)    no problem since last ablation in 2012   Past Surgical History:  Procedure Laterality Date   ABLATION  2012   x2 heart ablation   LAPAROSCOPIC OVARIAN CYSTECTOMY Left 11/09/2021   Procedure: LAPAROSCOPIC OVARIAN CYSTECTOMY;  Surgeon: Linzie Collin, MD;  Location: [facility];  Service: Gynecology;  Laterality: Left;   LAPAROSCOPIC UNILATERAL SALPINGECTOMY  11/09/2021   Procedure: LAPAROSCOPIC LEFT PARTIAL SALPINGECTOMY;  Surgeon: Linzie Collin, MD;  Location: [facility];  Service: Gynecology;;   Family History  Problem Relation Age of Onset   Breast cancer Maternal Grandmother        lung cancer    Prostate cancer Maternal Grandfather    Hypertension Mother    Heart Problems Father    Social History   Socioeconomic History   Marital status: Married    Spouse name: Rosalyn Gess   Number of children: Not on file   Years of education: Not on file   Highest education level: Not on file  Occupational History   Occupation: Runner, broadcasting/film/video    CommentHorticulturist, commercial - second grade  Tobacco Use   Smoking status: Never   Smokeless tobacco: Never  Vaping Use   Vaping status: Never Used  Substance and Sexual Activity    Alcohol use: Not Currently   Drug use: No   Sexual activity: Yes    Partners: Male    Birth control/protection: Condom  Other Topics Concern   Not on file  Social History Narrative   Not on file   Social Determinants of Health   Financial Resource Strain: Not on file  Food Insecurity: Not on file  Transportation Needs: Not on file  Physical Activity: Not on file  Stress: Not on file  Social Connections: Not on file     Review of Systems  Constitutional:  Negative for appetite change and unexpected weight change.  HENT:  Negative for congestion and sinus pressure.   Respiratory:  Negative for cough and chest tightness.   Cardiovascular:  Negative for chest pain and palpitations.  Gastrointestinal:  Negative for abdominal pain, diarrhea, nausea and vomiting.  Genitourinary:  Negative for difficulty urinating and dysuria.  Musculoskeletal:  Negative for joint swelling and myalgias.  Skin:  Negative for color change and rash.  Neurological:  Negative for dizziness and headaches.  Psychiatric/Behavioral:  Negative for agitation and dysphoric mood.        Objective:     BP 108/68   Pulse 80   Temp 97.9 F (36.6 C)   Resp 16   Ht 5\' 2"  (1.575 m)   Wt 137 lb (62.1 kg)  SpO2 99%   BMI 25.06 kg/m  Wt Readings from Last 3 Encounters:  09/09/23 137 lb (62.1 kg)  03/28/23 136 lb (61.7 kg)  02/27/23 134 lb 6 oz (61 kg)    Physical Exam Constitutional:      General: She is not in acute distress.    Appearance: Normal appearance.  HENT:     Head: Normocephalic and atraumatic.     Nose: Nose normal.     Mouth/Throat:     Pharynx: No oropharyngeal exudate or posterior oropharyngeal erythema.  Neck:     Thyroid: No thyromegaly.  Cardiovascular:     Rate and Rhythm: Normal rate and regular rhythm.  Pulmonary:     Effort: No respiratory distress.     Breath sounds: Normal breath sounds. No wheezing.  Abdominal:     General: Bowel sounds are normal.      Palpations: Abdomen is soft.     Tenderness: There is no abdominal tenderness.  Genitourinary:    Comments: Normal external genitalia.  Vaginal vault without lesions.  No retained tampon.    Musculoskeletal:        General: No swelling or tenderness.     Cervical back: Neck supple. No tenderness.  Lymphadenopathy:     Cervical: No cervical adenopathy.  Skin:    Findings: No erythema or rash.  Neurological:     Mental Status: She is alert.  Psychiatric:        Mood and Affect: Mood normal.        Behavior: Behavior normal.      Outpatient Encounter Medications as of 09/09/2023  Medication Sig   clonazePAM (KLONOPIN) 0.5 MG tablet Take 1/2 - 1 tablet q day prn.   No facility-administered encounter medications on file as of 09/09/2023.     Lab Results  Component Value Date   WBC 8.5 12/13/2021   HGB 12.5 12/13/2021   HCT 38.1 12/13/2021   PLT 395.0 12/13/2021   GLUCOSE 87 12/13/2021   ALT 10 12/13/2021   AST 12 12/13/2021   NA 137 12/13/2021   K 3.8 12/13/2021   CL 99 12/13/2021   CREATININE 0.68 12/13/2021   BUN 16 12/13/2021   CO2 28 12/13/2021   TSH 1.62 12/13/2021    No results found.     Assessment & Plan:  SOB (shortness of breath) Assessment & Plan: Describes symptoms as outlined.  Reports more of a feeling of not feeling like she is getting a good breath - at times.  No chest pain.  EKG -  SR with no acute ischemic changes.  Discussed further w/up.  Wants to monitor. Discussed increased stress/anxiety.  Follow.   Orders: -     EKG 12-Lead  SVT (supraventricular tachycardia) (HCC) Assessment & Plan: Has a history of SVT.  Currently stable.  Denies increased heart rate or palpitations. Follow.    Anxiety Assessment & Plan: Increased stress as outlined.  Discussed.  School is better.  On no medication.  Follow. Notify me if feels needs further intervention.    Retained tampon not found on examination Assessment & Plan: Concern regarding possible  retained tampon.  Vaginal exam did not reveal tampon to be present.     Gestational hypertension, third trimester Assessment & Plan: Blood pressure wnl on exam.  Follow.       Dale Pipestone, MD

## 2023-09-12 ENCOUNTER — Telehealth: Payer: BC Managed Care – PPO | Admitting: Nurse Practitioner

## 2023-09-12 DIAGNOSIS — J069 Acute upper respiratory infection, unspecified: Secondary | ICD-10-CM | POA: Diagnosis not present

## 2023-09-12 MED ORDER — DOXYCYCLINE HYCLATE 100 MG PO TABS: 100.0000 mg | ORAL_TABLET | Freq: Two times a day (BID) | ORAL | 0 refills | Status: AC

## 2023-09-12 MED ORDER — NEOMYCIN-POLYMYXIN-HC 1 % OT SOLN: OTIC | 0 refills | Status: DC

## 2023-09-12 NOTE — Progress Notes (Signed)
   Virtual Visit via Video Note  I connected with Sara Richardson on 09/12/23 at 1:07 PM by a video enabled telemedicine application and verified that I am speaking with the correct person using two identifiers.  Patient location: Office Provider Location: Office/Clinic  I discussed the limitations, risks, security, and privacy concerns of performing an evaluation and management service by video and the availability of in person appointments. I also discussed with the patient that there may be a patient responsible charge related to this service. The patient expressed understanding and agreed to proceed.  Subjective: PCP: [facility], MD  Chief Complaint  Patient presents with   URI    With congestion and ear pain   HPI Patient is seen due to some congestion, sinus pressure, runny nose, ear pain.  The symptom has been going for last 4 to 5 days has been taking over-the-counter medication without significant improvement. Denies any fever, body aches, headache.  ROS: Per HPI  Current Outpatient Medications:    NEOMYCIN-POLYMYXIN-HYDROCORTISONE (CORTISPORIN) 1 % SOLN OTIC solution, 4 gtt in affected ear(s) tid, max of 10 days. Lie with affected ear upward x 5 minutes, Disp: 10 mL, Rfl: 0   clonazePAM (KLONOPIN) 0.5 MG tablet, Take 1/2 - 1 tablet q day prn., Disp: 30 tablet, Rfl: 0  Observations/Objective: There were no vitals filed for this visit. Physical Exam Constitutional:      General: She is not in acute distress.    Appearance: Normal appearance. She is not ill-appearing.  Eyes:     Conjunctiva/sclera: Conjunctivae normal.  Pulmonary:     Effort: No respiratory distress.  Neurological:     Mental Status: She is alert.  Psychiatric:        Mood and Affect: Mood normal.        Behavior: Behavior normal.        Thought Content: Thought content normal.        Judgment: Judgment normal.     Assessment and Plan: URI with cough and congestion Assessment &  Plan: Given patient symptoms will treat with doxycycline polymyxin eardrops. Advised patient to increase fluid intake and rest. Use humidifier or steam. Patient would let us if symptoms not improves.   Other orders -     Doxycycline Hyclate; Take 1 tablet (100 mg total) by mouth 2 (two) times daily for 7 days.  Dispense: 14 tablet; Refill: 0 -     Neomycin-Polymyxin-HC; 4 gtt in affected ear(s) tid, max of 10 days. Lie with affected ear upward x 5 minutes  Dispense: 10 mL; Refill: 0    Follow Up Instructions: Return if symptoms worsen or fail to improve.   I discussed the assessment and treatment plan with the patient. The patient was provided an opportunity to ask questions, and all were answered. The patient agreed with the plan and demonstrated an understanding of the instructions.   The patient was advised to call back or seek an in-person evaluation if the symptoms worsen or if the condition fails to improve as anticipated.  The above assessment and management plan was discussed with the patient. The patient verbalized understanding of and has agreed to the management plan.   Kara Dies, NP

## 2023-09-14 ENCOUNTER — Encounter: Payer: Self-pay | Admitting: Internal Medicine

## 2023-09-14 DIAGNOSIS — T192XXA Foreign body in vulva and vagina, initial encounter: Secondary | ICD-10-CM | POA: Insufficient documentation

## 2023-09-14 NOTE — Assessment & Plan Note (Addendum)
Has a history of SVT.  Currently stable.  Denies increased heart rate or palpitations. Follow.

## 2023-09-14 NOTE — Assessment & Plan Note (Signed)
Blood pressure wnl on exam.  Follow.

## 2023-09-14 NOTE — Assessment & Plan Note (Signed)
Concern regarding possible retained tampon.  Vaginal exam did not reveal tampon to be present.

## 2023-09-14 NOTE — Assessment & Plan Note (Signed)
Describes symptoms as outlined.  Reports more of a feeling of not feeling like she is getting a good breath - at times.  No chest pain.  EKG -  SR with no acute ischemic changes.  Discussed further w/up.  Wants to monitor. Discussed increased stress/anxiety.  Follow.

## 2023-09-14 NOTE — Assessment & Plan Note (Signed)
Increased stress as outlined.  Discussed.  School is better.  On no medication.  Follow. Notify me if feels needs further intervention.

## 2023-09-22 DIAGNOSIS — J069 Acute upper respiratory infection, unspecified: Secondary | ICD-10-CM | POA: Insufficient documentation

## 2023-09-22 NOTE — Assessment & Plan Note (Signed)
Given patient symptoms will treat with doxycycline polymyxin eardrops. Advised patient to increase fluid intake and rest. Use humidifier or steam. Patient would let us if symptoms not improves.

## 2023-09-24 ENCOUNTER — Ambulatory Visit: Payer: BC Managed Care – PPO | Admitting: Obstetrics and Gynecology

## 2023-10-07 ENCOUNTER — Other Ambulatory Visit (HOSPITAL_COMMUNITY)
Admission: RE | Admit: 2023-10-07 | Discharge: 2023-10-07 | Disposition: A | Discharge status: Home / Self Care | Payer: BC Managed Care – PPO | Source: Ambulatory Visit | Attending: Obstetrics and Gynecology | Admitting: Obstetrics and Gynecology

## 2023-10-07 ENCOUNTER — Ambulatory Visit: Payer: BC Managed Care – PPO | Admitting: Obstetrics and Gynecology

## 2023-10-07 ENCOUNTER — Encounter: Payer: Self-pay | Admitting: Obstetrics and Gynecology

## 2023-10-07 VITALS — BP 115/75 | HR 88 | Ht 62.0 in | Wt 139.5 lb

## 2023-10-07 DIAGNOSIS — N898 Other specified noninflammatory disorders of vagina: Secondary | ICD-10-CM | POA: Insufficient documentation

## 2023-10-07 DIAGNOSIS — Z3169 Encounter for other general counseling and advice on procreation: Secondary | ICD-10-CM

## 2023-10-07 DIAGNOSIS — F419 Anxiety disorder, unspecified: Secondary | ICD-10-CM

## 2023-10-07 DIAGNOSIS — Z01419 Encounter for gynecological examination (general) (routine) without abnormal findings: Secondary | ICD-10-CM | POA: Insufficient documentation

## 2023-10-07 NOTE — Progress Notes (Signed)
Patients presents for annual exam today. She states regular monthly cycles. Reports she has increased anxiety, GAD score of 5. She states her excess of vaginal discharge is still an issue, nuswab today. Patient would also like to discuss becoming pregnant within the next year or two.  Up to date on pap smear.

## 2023-10-07 NOTE — Progress Notes (Signed)
HPI:      [name] is a 32 y.o. G1P1001 who LMP was Patient's last menstrual period was 10/01/2023 (approximate).  Subjective:   She presents today for her annual examination.  She has several issues to discuss today.  #1 she states that she had panic attacks in the past and was able to see a therapist and control them.  She stopped seeing her therapist several months ago and started a new job.  She says this has increased her anxiety and panic attacks.  She recently touch base with her therapist and plans to begin seeing her again. She continues to have a heavy vaginal discharge.  Her Nuswab's in the past have always been negative.  She would like to do another Nuswab today.  She has no concerns regarding STDs and does not want to be tested. She is currently using condoms of birth control and having normal regular cycles.  She plans to consider pregnancy in the next 2 years.  She has some concerns regarding Down syndrome.    Hx: The following portions of the patient's history were reviewed and updated as appropriate:             She  has a past medical history of Anxiety and SVT (supraventricular tachycardia) (HCC). She does not have any pertinent problems on file. She  has a past surgical history that includes Ablation (2012); Laparoscopic ovarian cystectomy (Left, 11/09/2021); and Laparoscopic unilateral salpingectomy (11/09/2021). Her family history includes Breast cancer in her maternal grandmother; Heart Problems in her father; Hypertension in her mother; Prostate cancer in her maternal grandfather. She  reports that she has never smoked. She has never used smokeless tobacco. She reports that she does not currently use alcohol. She reports that she does not use drugs. She has a current medication list which includes the following prescription(s): clonazepam. She is allergic to sulfamethoxazole-trimethoprim, ampicillin, and macrobid [nitrofurantoin].       Review of Systems:   Review of Systems  Constitutional: Denied constitutional symptoms, night sweats, recent illness, fatigue, fever, insomnia and weight loss.  Eyes: Denied eye symptoms, eye pain, photophobia, vision change and visual disturbance.  Ears/Nose/Throat/Neck: Denied ear, nose, throat or neck symptoms, hearing loss, nasal discharge, sinus congestion and sore throat.  Cardiovascular: Denied cardiovascular symptoms, arrhythmia, chest pain/pressure, edema, exercise intolerance, orthopnea and palpitations.  Respiratory: Denied pulmonary symptoms, asthma, pleuritic pain, productive sputum, cough, dyspnea and wheezing.  Gastrointestinal: Denied, gastro-esophageal reflux, melena, nausea and vomiting.  Genitourinary: See HPI for additional information.  Musculoskeletal: Denied musculoskeletal symptoms, stiffness, swelling, muscle weakness and myalgia.  Dermatologic: Denied dermatology symptoms, rash and scar.  Neurologic: Denied neurology symptoms, dizziness, headache, neck pain and syncope.  Psychiatric: Denied psychiatric symptoms, anxiety and depression.  Endocrine: Denied endocrine symptoms including hot flashes and night sweats.   Meds:   Current Outpatient Medications on File Prior to Visit  Medication Sig Dispense Refill   clonazePAM (KLONOPIN) 0.5 MG tablet Take 1/2 - 1 tablet q day prn. 30 tablet 0   No current facility-administered medications on file prior to visit.     Objective:     Vitals:   10/07/23 1512  BP: 115/75  Pulse: 88    Filed Weights   10/07/23 1512  Weight: 139 lb 8 oz (63.3 kg)              Physical examination General NAD, Conversant  HEENT Atraumatic; Op clear with mmm.  Normo-cephalic.  Anicteric sclerae  Thyroid/Neck Smooth without  nodularity or enlargement. Normal ROM.  Neck Supple.  Skin No rashes, lesions or ulceration. Normal palpated skin turgor. No nodularity.  Breasts: No masses or discharge.  Symmetric.  No axillary adenopathy.  Lungs: Clear to  auscultation.No rales or wheezes. Normal Respiratory effort, no retractions.  Heart: NSR.  No murmurs or rubs appreciated. No peripheral edema  Abdomen: Soft.  Non-tender.  No masses.  No HSM. No hernia  Extremities: Moves all appropriately.  Normal ROM for age. No lymphadenopathy.  Neuro: Oriented to PPT.  Normal mood. Normal affect.     Pelvic: Deferred    Assessment:    G1P1001 Patient Active Problem List   Diagnosis Date Noted   URI with cough and congestion 09/22/2023   Retained tampon not found on examination 09/14/2023   SOB (shortness of breath) 09/09/2023   Corn of toe 02/27/2023   Migraine 01/30/2023   Cerumen impaction 11/13/2022   Sinusitis 10/10/2022   Urinary incontinence 06/23/2022   Vaginal discharge 05/21/2022   Hair loss 12/13/2021   Gestational hypertension 08/28/2021   Group B streptococcal carriage complicating pregnancy 08/08/2021   Adnexal mass 03/23/2021   Bee sting 02/20/2020   Lymphadenopathy 02/20/2020   Healthcare maintenance 12/11/2017   Back pain 12/11/2017   SVT (supraventricular tachycardia) (HCC) 12/01/2016   Anxiety 12/01/2016   Menstrual cramps 12/01/2016   Hx of prior ablation treatment 06/26/2013     1. Well woman exam with routine gynecological exam   2. Vaginal discharge   3. Anxiety   4. Pre-conception counseling     GAD test is 5.  Anxiety is very similar to her past panic attacks and she has restarted seeing her therapist regarding these.  Medication discussed but she plans to control these again by herself and through her therapist.  Patient has concerns regarding Down syndrome.  We have discussed the actual statistics and timing of pregnancy regarding the statistics.  Also have discussed first trimester chromosomal testing.   Plan:            1.  Basic Screening Recommendations The basic screening recommendations for asymptomatic women were discussed with the patient during her visit.  The age-appropriate recommendations  were discussed with her and the rational for the tests reviewed.  When I am informed by the patient that another primary care physician has previously obtained the age-appropriate tests and they are up-to-date, only outstanding tests are ordered and referrals given as necessary.  Abnormal results of tests will be discussed with her when all of her results are completed.  Routine preventative health maintenance measures emphasized: Exercise/Diet/Weight control, Tobacco Warnings, Alcohol/Substance use risks and Stress Management 2.  Reassured patient regarding future pregnancy in the next few years and low likelihood of Down syndrome.  Additional testing in the first trimester discussed 3.  Nuswab performed at patient request 4.  She plans to continue seeing her therapist regarding possible panic attacks. Orders No orders of the defined types were placed in this encounter.   No orders of the defined types were placed in this encounter.         F/U  Return in about 1 year (around 10/06/2024) for Annual Physical.  Elonda Husky, M.D. 10/07/2023 3:52 PM

## 2023-10-10 LAB — CERVICOVAGINAL ANCILLARY ONLY
Bacterial Vaginitis (gardnerella): NEGATIVE
Candida Glabrata: NEGATIVE
Candida Vaginitis: NEGATIVE
Comment: NEGATIVE
Comment: NEGATIVE
Comment: NEGATIVE

## 2023-10-24 ENCOUNTER — Other Ambulatory Visit: Payer: Self-pay

## 2023-10-24 ENCOUNTER — Telehealth: Payer: BC Managed Care – PPO | Admitting: Internal Medicine

## 2023-10-24 ENCOUNTER — Encounter: Payer: Self-pay | Admitting: Internal Medicine

## 2023-10-24 ENCOUNTER — Other Ambulatory Visit (INDEPENDENT_AMBULATORY_CARE_PROVIDER_SITE_OTHER): Payer: BC Managed Care – PPO

## 2023-10-24 VITALS — Ht 62.0 in | Wt 139.0 lb

## 2023-10-24 DIAGNOSIS — R109 Unspecified abdominal pain: Secondary | ICD-10-CM

## 2023-10-24 DIAGNOSIS — R300 Dysuria: Secondary | ICD-10-CM | POA: Diagnosis not present

## 2023-10-24 LAB — POC URINALSYSI DIPSTICK (AUTOMATED)
Bilirubin, UA: NEGATIVE
Glucose, UA: NEGATIVE
Ketones, UA: NEGATIVE
Leukocytes, UA: NEGATIVE
Nitrite, UA: NEGATIVE
Protein, UA: NEGATIVE
Spec Grav, UA: 1.01 (ref 1.010–1.025)
Urobilinogen, UA: 0.2 U/dL
pH, UA: 5.5 (ref 5.0–8.0)

## 2023-10-24 NOTE — Addendum Note (Signed)
Addended by: Warden Fillers on: 10/24/2023 02:33 PM   Modules accepted: Orders

## 2023-10-24 NOTE — Progress Notes (Unsigned)
Patient ID: Sara Richardson, female   DOB: 1991-10-27, 32 y.o.   MRN: [id number]   Virtual Visit via video Note  I connected with Sara Richardson by a video enabled telemedicine application and verified that I am speaking with the correct person using two identifiers. Location patient: home Location provider: work Persons participating in the virtual visit: patient, provider  The limitations, risks, security and privacy concerns of performing an evaluation and management service by video and the availability of in person appointments have been discussed. It has also beendiscussed with the patient that there may be a patient responsible charge related to this service. The patient expressed understanding and agreed to proceed.   Reason for visit: work in appt  HPI: Work in appt - work in with concerns regarding a possible urinary tract infection. Noticed the week of Thanksgiving - lower abdominal cramps. Feeling similar to a previous UTI. She started drinking water and cranberry juice. Symptoms improved. Has been having some symptoms - urinary urgency and intermittent cramps. Yesterday noticed - white chunks in urine. No hematuria. No change with vaginal discharge - persistent vaginal discharge. No fever. Eating and drinking ok.    ROS: See pertinent positives and negatives per HPI.  Past Medical History:  Diagnosis Date   Anxiety    SVT (supraventricular tachycardia) (HCC)    no problem since last ablation in 2012    Past Surgical History:  Procedure Laterality Date   ABLATION  2012   x2 heart ablation   LAPAROSCOPIC OVARIAN CYSTECTOMY Left 11/09/2021   Procedure: LAPAROSCOPIC OVARIAN CYSTECTOMY;  Surgeon: Linzie Collin, MD;  Location: [facility];  Service: Gynecology;  Laterality: Left;   LAPAROSCOPIC UNILATERAL SALPINGECTOMY  11/09/2021   Procedure: LAPAROSCOPIC LEFT PARTIAL SALPINGECTOMY;  Surgeon: Linzie Collin, MD;  Location: [facility];  Service: Gynecology;;    Family  History  Problem Relation Age of Onset   Breast cancer Maternal Grandmother        lung cancer    Prostate cancer Maternal Grandfather    Hypertension Mother    Heart Problems Father     SOCIAL HX: reviewed.    Current Outpatient Medications:    clonazePAM (KLONOPIN) 0.5 MG tablet, Take 1/2 - 1 tablet q day prn., Disp: 30 tablet, Rfl: 0  EXAM:  GENERAL: alert, oriented, appears well and in no acute distress  HEENT: atraumatic, conjunttiva clear, no obvious abnormalities on inspection of external nose and ears  NECK: normal movements of the head and neck  LUNGS: on inspection no signs of respiratory distress, breathing rate appears normal, no obvious gross SOB, gasping or wheezing  CV: no obvious cyanosis  PSYCH/NEURO: pleasant and cooperative, no obvious depression or anxiety, speech and thought processing grossly intact  ASSESSMENT AND PLAN:  Discussed the following assessment and plan:  Problem List Items Addressed This Visit     Abdominal cramps - Primary   Abdominal cramps as outlined. Some urinary urgency. Did start her period within the last 24 hours. Urinalysis - blood, but negative nitrite, negative leukocytes. Hold on abx. Continue to stay hydrated. Wait for culture results. Notify if symptoms change or worsen.        Return if symptoms worsen or fail to improve.   I discussed the assessment and treatment plan with the patient. The patient was provided an opportunity to ask questions and all were answered. The patient agreed with the plan and demonstrated an understanding of the instructions.   The patient was advised to call  back or seek an in-person evaluation if the symptoms worsen or if the condition fails to improve as anticipated.    Dale Shubert, MD

## 2023-10-24 NOTE — Telephone Encounter (Signed)
See if can leave a urine sample this pm (urine dip, micro and urine culture) and do a virtual visit this pm to discuss symptoms and results.  See me if questions

## 2023-10-25 NOTE — Telephone Encounter (Signed)
Pt evaluated 10/24/23.

## 2023-10-26 ENCOUNTER — Encounter: Payer: Self-pay | Admitting: Internal Medicine

## 2023-10-26 DIAGNOSIS — R109 Unspecified abdominal pain: Secondary | ICD-10-CM | POA: Insufficient documentation

## 2023-10-26 LAB — URINALYSIS, ROUTINE W REFLEX MICROSCOPIC
Bacteria, UA: NONE SEEN /HPF
Bilirubin Urine: NEGATIVE
Glucose, UA: NEGATIVE
Hyaline Cast: NONE SEEN /LPF
Ketones, ur: NEGATIVE
Leukocytes,Ua: NEGATIVE
Nitrite: NEGATIVE
Protein, ur: NEGATIVE
Specific Gravity, Urine: 1.009 (ref 1.001–1.035)
Squamous Epithelial / HPF: NONE SEEN /HPF (ref <=5)
WBC, UA: NONE SEEN /HPF (ref 0–5)
pH: 6.5 (ref 5.0–8.0)

## 2023-10-26 LAB — URINE CULTURE
MICRO NUMBER:: 15848506
Result:: NO GROWTH
SPECIMEN QUALITY:: ADEQUATE

## 2023-10-26 LAB — MICROSCOPIC MESSAGE

## 2023-10-26 NOTE — Assessment & Plan Note (Signed)
Abdominal cramps as outlined. Some urinary urgency. Did start her period within the last 24 hours. Urinalysis - blood, but negative nitrite, negative leukocytes. Hold on abx. Continue to stay hydrated. Wait for culture results. Notify if symptoms change or worsen.

## 2023-10-30 ENCOUNTER — Encounter: Payer: Self-pay | Admitting: Internal Medicine

## 2023-10-30 ENCOUNTER — Other Ambulatory Visit: Payer: Self-pay

## 2023-10-30 DIAGNOSIS — R319 Hematuria, unspecified: Secondary | ICD-10-CM

## 2023-10-30 DIAGNOSIS — H938X1 Other specified disorders of right ear: Secondary | ICD-10-CM

## 2023-10-30 DIAGNOSIS — R599 Enlarged lymph nodes, unspecified: Secondary | ICD-10-CM

## 2023-10-31 ENCOUNTER — Telehealth: Payer: Self-pay

## 2023-10-31 NOTE — Telephone Encounter (Signed)
See phone note

## 2023-10-31 NOTE — Telephone Encounter (Signed)
Copied from CRM [id number]. Topic: General - Other >> Oct 31, 2023  8:43 AM Donita Brooks wrote: Reason for CRM: pt sent message to provider a regarding this matter ear pain on right ear / swollen lymph It's on the back right (my right) side of my neck. Pt would like for a nurse to call back to discuss if she should make a appointment or not. Call back - [phone]

## 2023-10-31 NOTE — Telephone Encounter (Signed)
Called patient. Asked if she could take old abx/ear drops she has at home. Advised cannot recommend that. She is going to do an e-visit. Unable to go to urgent care to be seen. Advised Dr Lorin Picket is out of office until 1/3. Pt gave verbal understanding.

## 2023-11-13 ENCOUNTER — Ambulatory Visit: Payer: BC Managed Care – PPO | Admitting: Internal Medicine

## 2023-11-13 ENCOUNTER — Ambulatory Visit: Payer: BC Managed Care – PPO | Admitting: Nurse Practitioner

## 2023-11-13 ENCOUNTER — Encounter: Payer: Self-pay | Admitting: Nurse Practitioner

## 2023-11-13 VITALS — BP 116/74 | HR 89 | Temp 97.8°F | Ht 62.0 in | Wt 138.4 lb

## 2023-11-13 DIAGNOSIS — H6591 Unspecified nonsuppurative otitis media, right ear: Secondary | ICD-10-CM

## 2023-11-13 NOTE — Assessment & Plan Note (Signed)
 Middle ear effusion R ear, Denise tenderness, doing better. Non tender posterior cervical lymph node swelling likely due to ear effusion. Advised to continuously use the antihistamine and perform warm compress to the swollen lymph node.  Patient will let us  know if symptoms not improving.

## 2023-11-13 NOTE — Progress Notes (Signed)
 Established Patient Office Visit  Subjective:  Patient ID: Sara Richardson, female    DOB: 11/07/1991  Age: 33 y.o. MRN: [id number]  CC:  Chief Complaint  Patient presents with   Acute Visit    Fluid in ear    HPI  Sara Richardson presents for fluid in the ear. She went to the urgent care on 10/31/23 with concerns of swollen lymph node, ear pain and PND. Since then she has been taking antihistamine daily and ipratropium nasal spray as needed.  Her ear pain has been resolved but the still has swollen lymph node. It is non tender and has not increased in size. She would like to get her ear evaluated again.   Sara Richardson any fever, ST, runny nose at present.   HPI   Past Medical History:  Diagnosis Date   Anxiety    SVT (supraventricular tachycardia) (HCC)    no problem since last ablation in 2012    Past Surgical History:  Procedure Laterality Date   ABLATION  2012   x2 heart ablation   LAPAROSCOPIC OVARIAN CYSTECTOMY Left 11/09/2021   Procedure: LAPAROSCOPIC OVARIAN CYSTECTOMY;  Surgeon: Janit Alm Agent, MD;  Location: [facility];  Service: Gynecology;  Laterality: Left;   LAPAROSCOPIC UNILATERAL SALPINGECTOMY  11/09/2021   Procedure: LAPAROSCOPIC LEFT PARTIAL SALPINGECTOMY;  Surgeon: Janit Alm Agent, MD;  Location: [facility];  Service: Gynecology;;    Family History  Problem Relation Age of Onset   Breast cancer Maternal Grandmother        lung cancer    Prostate cancer Maternal Grandfather    Hypertension Mother    Heart Problems Father     Social History   Socioeconomic History   Marital status: Married    Spouse name: Concha   Number of children: Not on file   Years of education: Not on file   Highest education level: Not on file  Occupational History   Occupation: Runner, Broadcasting/film/video    CommentHorticulturist, Commercial - second grade  Tobacco Use   Smoking status: Never   Smokeless tobacco: Never  Vaping Use   Vaping status: Never Used  Substance and Sexual Activity    Alcohol use: Not Currently   Drug use: No   Sexual activity: Yes    Partners: Male    Birth control/protection: Condom  Other Topics Concern   Not on file  Social History Narrative   Not on file   Social Drivers of Health   Financial Resource Strain: Not on file  Food Insecurity: Not on file  Transportation Needs: Not on file  Physical Activity: Not on file  Stress: Not on file  Social Connections: Not on file  Intimate Partner Violence: Not on file     Outpatient Medications Prior to Visit  Medication Sig Dispense Refill   clonazePAM  (KLONOPIN ) 0.5 MG tablet Take 1/2 - 1 tablet q day prn. 30 tablet 0   ipratropium (ATROVENT) 0.06 % nasal spray 1-2 spray each nostril three times a day as needed for nasal congestion.     No facility-administered medications prior to visit.    Allergies  Allergen Reactions   Sulfamethoxazole -Trimethoprim  Hives and Itching   Ampicillin  Itching   Macrobid  [Nitrofurantoin ]     Numbness and tingling in hands and fingers, eventually went away    ROS Review of Systems Negative unless indicated in HPI.    Objective:    Physical Exam Constitutional:      Appearance: Normal appearance.  HENT:  Right Ear: A middle ear effusion is present.     Left Ear:  No middle ear effusion.  Cardiovascular:     Pulses: Normal pulses.     Heart sounds: Normal heart sounds.  Pulmonary:     Effort: Pulmonary effort is normal.     Breath sounds: Normal breath sounds. No stridor. No wheezing.  Lymphadenopathy:     Cervical: Cervical adenopathy present.     Right cervical: Posterior cervical adenopathy present.  Skin:    General: Skin is warm.  Neurological:     General: No focal deficit present.     Mental Status: She is alert. Mental status is at baseline.  Psychiatric:        Mood and Affect: Mood normal.        Behavior: Behavior normal.        Thought Content: Thought content normal.     BP 116/74   Pulse 89   Temp 97.8 F (36.6 C)    Ht 5' 2 (1.575 m)   Wt 138 lb 6.4 oz (62.8 kg)   LMP 10/24/2023 (Exact Date)   SpO2 99%   BMI 25.31 kg/m  Wt Readings from Last 3 Encounters:  11/13/23 138 lb 6.4 oz (62.8 kg)  10/24/23 139 lb (63 kg)  10/07/23 139 lb 8 oz (63.3 kg)     Health Maintenance  Topic Date Due   HPV VACCINES (2 - 3-dose series) 11/29/2013   COVID-19 Vaccine (1 - 2024-25 season) 11/29/2023 (Originally 07/13/2023)   INFLUENZA VACCINE  02/09/2024 (Originally 06/12/2023)   Cervical Cancer Screening (HPV/Pap Cotest)  09/20/2027   DTaP/Tdap/Td (2 - Td or Tdap) 06/14/2031   Hepatitis C Screening  Completed   HIV Screening  Completed       Topic Date Due   HPV VACCINES (2 - 3-dose series) 11/29/2013    Lab Results  Component Value Date   TSH 1.62 12/13/2021   Lab Results  Component Value Date   WBC 8.5 12/13/2021   HGB 12.5 12/13/2021   HCT 38.1 12/13/2021   MCV 77.6 (L) 12/13/2021   PLT 395.0 12/13/2021   Lab Results  Component Value Date   NA 137 12/13/2021   K 3.8 12/13/2021   CO2 28 12/13/2021   GLUCOSE 87 12/13/2021   BUN 16 12/13/2021   CREATININE 0.68 12/13/2021   BILITOT 0.2 12/13/2021   ALKPHOS 76 12/13/2021   AST 12 12/13/2021   ALT 10 12/13/2021   PROT 7.5 12/13/2021   ALBUMIN 4.7 12/13/2021   CALCIUM 9.8 12/13/2021   ANIONGAP 10 05/17/2019   GFR 117.68 12/13/2021   No results found for: CHOL No results found for: HDL No results found for: LDLCALC No results found for: TRIG No results found for: CHOLHDL No results found for: YHAJ8R    Assessment & Plan:  Otitis media with effusion, right Assessment & Plan: Middle ear effusion R ear, Sara Richardson tenderness, doing better. Non tender posterior cervical lymph node swelling likely due to ear effusion. Advised to continuously use the antihistamine and perform warm compress to the swollen lymph node.  Patient will let us  know if symptoms not improving.       Follow-up: Return if symptoms worsen or fail to  improve.   Jamori Biggar, NP

## 2023-11-13 NOTE — Patient Instructions (Signed)
 Continue the antihistamine daily. Use warm compress for the swollen lymph node. Let us know if not improving.

## 2023-11-18 NOTE — Telephone Encounter (Signed)
 Holding for work in spot;

## 2023-11-18 NOTE — Telephone Encounter (Signed)
 Pt scheduled

## 2023-11-19 ENCOUNTER — Encounter: Payer: Self-pay | Admitting: Internal Medicine

## 2023-11-19 ENCOUNTER — Ambulatory Visit: Payer: 59 | Admitting: Internal Medicine

## 2023-11-19 VITALS — BP 114/72 | HR 86 | Temp 98.0°F | Resp 16 | Ht 62.0 in | Wt 136.0 lb

## 2023-11-19 DIAGNOSIS — H938X1 Other specified disorders of right ear: Secondary | ICD-10-CM

## 2023-11-19 DIAGNOSIS — R599 Enlarged lymph nodes, unspecified: Secondary | ICD-10-CM

## 2023-11-19 NOTE — Progress Notes (Signed)
 Subjective:    Patient ID: Sara Richardson, female    DOB: 03/28/1991, 33 y.o.   MRN: [id number]  Patient here for  Chief Complaint  Patient presents with   Ear Fullness    HPI Here for work in appt - evaluated UC 10/31/23 - right ear pain and swollen lymph node. Diagnosed with serous OM. Prescribed atrovent nasal spray. Did not get prescription. Used afrin nasal spray x 1 week. Reevaluated 11/13/23. No ear pain. No sinus pressure or increased nasal congestion. No fever.  Some fullness - right ear. No chest congestion.  Persistent lymph node fullness.    Past Medical History:  Diagnosis Date   Anxiety    SVT (supraventricular tachycardia) (HCC)    no problem since last ablation in 2012   Past Surgical History:  Procedure Laterality Date   ABLATION  2012   x2 heart ablation   LAPAROSCOPIC OVARIAN CYSTECTOMY Left 11/09/2021   Procedure: LAPAROSCOPIC OVARIAN CYSTECTOMY;  Surgeon: Janit Alm Agent, MD;  Location: [facility];  Service: Gynecology;  Laterality: Left;   LAPAROSCOPIC UNILATERAL SALPINGECTOMY  11/09/2021   Procedure: LAPAROSCOPIC LEFT PARTIAL SALPINGECTOMY;  Surgeon: Janit Alm Agent, MD;  Location: [facility];  Service: Gynecology;;   Family History  Problem Relation Age of Onset   Breast cancer Maternal Grandmother        lung cancer    Prostate cancer Maternal Grandfather    Hypertension Mother    Heart Problems Father    Social History   Socioeconomic History   Marital status: Married    Spouse name: Concha   Number of children: Not on file   Years of education: Not on file   Highest education level: Not on file  Occupational History   Occupation: Runner, Broadcasting/film/video    CommentHorticulturist, Commercial - second grade  Tobacco Use   Smoking status: Never   Smokeless tobacco: Never  Vaping Use   Vaping status: Never Used  Substance and Sexual Activity   Alcohol use: Not Currently   Drug use: No   Sexual activity: Yes    Partners: Male    Birth control/protection: Condom   Other Topics Concern   Not on file  Social History Narrative   Not on file   Social Drivers of Health   Financial Resource Strain: Not on file  Food Insecurity: Not on file  Transportation Needs: Not on file  Physical Activity: Not on file  Stress: Not on file  Social Connections: Not on file     Review of Systems  Constitutional:  Negative for appetite change, fever and unexpected weight change.  HENT:  Negative for sinus pressure and sore throat.        No increased nasal congestion or sinus pressure.  Persistent ear fullness - no ear pain.   Respiratory:  Negative for cough, chest tightness and shortness of breath.   Cardiovascular:  Negative for chest pain, palpitations and leg swelling.  Gastrointestinal:  Negative for abdominal pain, diarrhea, nausea and vomiting.  Musculoskeletal:  Negative for joint swelling and myalgias.  Skin:  Negative for color change and rash.  Neurological:  Negative for dizziness and headaches.  Psychiatric/Behavioral:  Negative for agitation and dysphoric mood.        Objective:     BP 114/72   Pulse 86   Temp 98 F (36.7 C)   Resp 16   Ht 5' 2 (1.575 m)   Wt 136 lb (61.7 kg)   LMP 10/24/2023 (Exact Date)  SpO2 99%   BMI 24.87 kg/m  Wt Readings from Last 3 Encounters:  11/19/23 136 lb (61.7 kg)  11/13/23 138 lb 6.4 oz (62.8 kg)  10/24/23 139 lb (63 kg)    Physical Exam Vitals reviewed.  Constitutional:      General: She is not in acute distress.    Appearance: Normal appearance.  HENT:     Head: Normocephalic and atraumatic.     Right Ear: Ear canal and external ear normal.     Left Ear: Tympanic membrane, ear canal and external ear normal.     Ears:     Comments: Right TM - no erythema. Minimal dullness.     Nose: No congestion.     Mouth/Throat:     Pharynx: No oropharyngeal exudate or posterior oropharyngeal erythema.  Eyes:     General: No scleral icterus.       Right eye: No discharge.        Left eye: No  discharge.     Conjunctiva/sclera: Conjunctivae normal.  Neck:     Thyroid: No thyromegaly.     Comments: Right - posterior lateral neck fullness/lymph node.  Cardiovascular:     Rate and Rhythm: Normal rate and regular rhythm.  Pulmonary:     Effort: No respiratory distress.     Breath sounds: Normal breath sounds. No wheezing.  Abdominal:     Palpations: Abdomen is soft.     Tenderness: There is no abdominal tenderness.  Musculoskeletal:        General: No swelling or tenderness.     Cervical back: Neck supple. No tenderness.  Skin:    Findings: No erythema or rash.  Neurological:     Mental Status: She is alert.  Psychiatric:        Mood and Affect: Mood normal.        Behavior: Behavior normal.      Outpatient Encounter Medications as of 11/19/2023  Medication Sig   clonazePAM  (KLONOPIN ) 0.5 MG tablet Take 1/2 - 1 tablet q day prn.   [DISCONTINUED] ipratropium (ATROVENT) 0.06 % nasal spray 1-2 spray each nostril three times a day as needed for nasal congestion.   No facility-administered encounter medications on file as of 11/19/2023.     Lab Results  Component Value Date   WBC 8.5 12/13/2021   HGB 12.5 12/13/2021   HCT 38.1 12/13/2021   PLT 395.0 12/13/2021   GLUCOSE 87 12/13/2021   ALT 10 12/13/2021   AST 12 12/13/2021   NA 137 12/13/2021   K 3.8 12/13/2021   CL 99 12/13/2021   CREATININE 0.68 12/13/2021   BUN 16 12/13/2021   CO2 28 12/13/2021   TSH 1.62 12/13/2021    No results found.     Assessment & Plan:  Ear fullness, right Assessment & Plan: Exam as outlined.  No increased erythema TM. No cerumen impaction. No sinus symptoms.  Treat with nasacort nasal spray as directed.  Continue antihistamine. Follow.  Call with update.  Discussed if persistence, will need ENT evaluation.    Enlarged lymph node Assessment & Plan: Right - posterior lateral lymph node fullness - as outlined. Feel related to her ear - reactive. Follow.  Will notify me if  persistent.       Allena Hamilton, MD

## 2023-11-19 NOTE — Assessment & Plan Note (Signed)
 Exam as outlined.  No increased erythema TM. No cerumen impaction. No sinus symptoms.  Treat with nasacort nasal spray as directed.  Continue antihistamine. Follow.  Call with update.  Discussed if persistence, will need ENT evaluation.

## 2023-11-19 NOTE — Assessment & Plan Note (Signed)
 Right - posterior lateral lymph node fullness - as outlined. Feel related to her ear - reactive. Follow.  Will notify me if persistent.

## 2023-11-19 NOTE — Patient Instructions (Signed)
 Nasacort nasal spray - 2 sprays each nostril one time per day.   Continue your antihistamine.

## 2023-11-21 NOTE — Telephone Encounter (Signed)
Update for you. 

## 2023-11-21 NOTE — Telephone Encounter (Signed)
 Given the persistent ear issue and lymph node, I would like to refer her to ENT for evaluation.

## 2023-12-03 NOTE — Telephone Encounter (Signed)
If she wants to come in and have me recheck it - I can.  Can work in Friday if desires.

## 2023-12-04 NOTE — Telephone Encounter (Signed)
Pt not available for an appointment Friday due to being out of town for a funeral.  Scheduled to see Dr. Lorin Picket Monday 12/08/23 @ 4pm.

## 2023-12-08 ENCOUNTER — Ambulatory Visit: Payer: 59 | Admitting: Internal Medicine

## 2023-12-08 ENCOUNTER — Encounter: Payer: Self-pay | Admitting: Internal Medicine

## 2023-12-08 VITALS — BP 120/80 | HR 94 | Temp 98.0°F | Ht 62.0 in | Wt 138.0 lb

## 2023-12-08 DIAGNOSIS — F419 Anxiety disorder, unspecified: Secondary | ICD-10-CM

## 2023-12-08 DIAGNOSIS — R599 Enlarged lymph nodes, unspecified: Secondary | ICD-10-CM

## 2023-12-08 MED ORDER — SERTRALINE HCL 25 MG PO TABS: 25.0000 mg | ORAL_TABLET | Freq: Every day | ORAL | 2 refills | Status: DC

## 2023-12-08 NOTE — Assessment & Plan Note (Signed)
Increased stress as outlined.  Discussed.  New job. New school.  Feels she needs something to help level things out. Will start low dose zoloft 25mg  as directed. Follow.  Schedule f/u soon to reassess.

## 2023-12-08 NOTE — Progress Notes (Signed)
Subjective:    Patient ID: Sara Richardson, female    DOB: 02-05-1991, 33 y.o.   MRN: [id number]  Patient here for  Chief Complaint  Patient presents with   Neck Pain    HPI Here for follow up appt - f/u regarding lymph node. Evaluated UC 10/31/23 - right ear pain and swollen lymph node. Diagnosed with serous OM. Prescribed atrovent nasal spray. Did not get prescription. Used afrin nasal spray x 1 week. Reevaluated 11/13/23. No ear pain. No sinus pressure or increased nasal congestion. No fever.  Some fullness - right ear. No chest congestion.  Persistent lymph node fullness recommended to continue antihistamine. Reevaluated 1/8 - steroid nasal spray and antihistamine. In today for f/u. Has appt with ENT next week - secondary to persistent palpable lymph node. Denies any increased sinus pressure or nasal congestion. No sore throat. No gum or tooth issues. No earache. Occasionally will notice some ringing in her ear. No chest congestion or cough. Does report some increased stress.  Discussed. Feels needs something to help level things out. Had previously discussed zoloft. Agreeable to start low dose.    Past Medical History:  Diagnosis Date   Anxiety    SVT (supraventricular tachycardia) (HCC)    no problem since last ablation in 2012   Past Surgical History:  Procedure Laterality Date   ABLATION  2012   x2 heart ablation   LAPAROSCOPIC OVARIAN CYSTECTOMY Left 11/09/2021   Procedure: LAPAROSCOPIC OVARIAN CYSTECTOMY;  Surgeon: Linzie Collin, MD;  Location: [facility];  Service: Gynecology;  Laterality: Left;   LAPAROSCOPIC UNILATERAL SALPINGECTOMY  11/09/2021   Procedure: LAPAROSCOPIC LEFT PARTIAL SALPINGECTOMY;  Surgeon: Linzie Collin, MD;  Location: [facility];  Service: Gynecology;;   Family History  Problem Relation Age of Onset   Breast cancer Maternal Grandmother        lung cancer    Prostate cancer Maternal Grandfather    Hypertension Mother    Heart Problems Father     Social History   Socioeconomic History   Marital status: Married    Spouse name: Rosalyn Gess   Number of children: Not on file   Years of education: Not on file   Highest education level: Not on file  Occupational History   Occupation: Runner, broadcasting/film/video    CommentHorticulturist, commercial - second grade  Tobacco Use   Smoking status: Never   Smokeless tobacco: Never  Vaping Use   Vaping status: Never Used  Substance and Sexual Activity   Alcohol use: Not Currently   Drug use: No   Sexual activity: Yes    Partners: Male    Birth control/protection: Condom  Other Topics Concern   Not on file  Social History Narrative   Not on file   Social Drivers of Health   Financial Resource Strain: Not on file  Food Insecurity: Not on file  Transportation Needs: Not on file  Physical Activity: Not on file  Stress: Not on file  Social Connections: Not on file     Review of Systems  Constitutional:  Negative for appetite change and unexpected weight change.  HENT:  Negative for congestion, ear pain, sinus pressure and sore throat.        Occasional ringing in ears.   Respiratory:  Negative for cough, chest tightness and shortness of breath.   Cardiovascular:  Negative for chest pain, palpitations and leg swelling.  Gastrointestinal:  Negative for abdominal pain, diarrhea, nausea and vomiting.  Genitourinary:  Negative for difficulty urinating  and dysuria.  Musculoskeletal:  Negative for joint swelling and myalgias.  Skin:  Negative for color change and rash.  Neurological:  Negative for dizziness and headaches.  Psychiatric/Behavioral:  Negative for agitation and dysphoric mood.        Objective:     BP 120/80   Pulse 94   Temp 98 F (36.7 C) (Oral)   Ht 5\' 2"  (1.575 m)   Wt 138 lb (62.6 kg)   SpO2 99%   BMI 25.24 kg/m  Wt Readings from Last 3 Encounters:  12/08/23 138 lb (62.6 kg)  11/19/23 136 lb (61.7 kg)  11/13/23 138 lb 6.4 oz (62.8 kg)    Physical Exam Vitals reviewed.   Constitutional:      General: She is not in acute distress.    Appearance: Normal appearance.  HENT:     Head: Normocephalic and atraumatic.     Right Ear: External ear normal.     Left Ear: External ear normal.  Eyes:     General: No scleral icterus.       Right eye: No discharge.        Left eye: No discharge.     Conjunctiva/sclera: Conjunctivae normal.  Neck:     Thyroid: No thyromegaly.     Comments: Palpable fullness/lymph nose - right lateral neck. Non tender.  Cardiovascular:     Rate and Rhythm: Normal rate and regular rhythm.  Pulmonary:     Effort: No respiratory distress.     Breath sounds: Normal breath sounds. No wheezing.  Abdominal:     General: Bowel sounds are normal.     Palpations: Abdomen is soft.     Tenderness: There is no abdominal tenderness.  Musculoskeletal:        General: No swelling or tenderness.     Cervical back: Neck supple. No tenderness.  Skin:    Findings: No erythema or rash.  Neurological:     Mental Status: She is alert.  Psychiatric:        Mood and Affect: Mood normal.        Behavior: Behavior normal.         Outpatient Encounter Medications as of 12/08/2023  Medication Sig   clonazePAM (KLONOPIN) 0.5 MG tablet Take 1/2 - 1 tablet q day prn.   sertraline (ZOLOFT) 25 MG tablet Take 1 tablet (25 mg total) by mouth daily.   No facility-administered encounter medications on file as of 12/08/2023.     Lab Results  Component Value Date   WBC 8.5 12/13/2021   HGB 12.5 12/13/2021   HCT 38.1 12/13/2021   PLT 395.0 12/13/2021   GLUCOSE 87 12/13/2021   ALT 10 12/13/2021   AST 12 12/13/2021   NA 137 12/13/2021   K 3.8 12/13/2021   CL 99 12/13/2021   CREATININE 0.68 12/13/2021   BUN 16 12/13/2021   CO2 28 12/13/2021   TSH 1.62 12/13/2021       Assessment & Plan:  Anxiety Assessment & Plan: Increased stress as outlined.  Discussed.  New job. New school.  Feels she needs something to help level things out. Will start  low dose zoloft 25mg  as directed. Follow.  Schedule f/u soon to reassess.    Enlarged lymph node Assessment & Plan: Improved on exam. Persistent palpable fullness. No evidence of infection. Has been using steroid nasal spray and taking antihistamine daily. Has appt with ENT next week. See if can arrange an earlier appt.  Follow.  Other orders -     Sertraline HCl; Take 1 tablet (25 mg total) by mouth daily.  Dispense: 30 tablet; Refill: 2     Dale Turner, MD

## 2023-12-08 NOTE — Assessment & Plan Note (Signed)
Improved on exam. Persistent palpable fullness. No evidence of infection. Has been using steroid nasal spray and taking antihistamine daily. Has appt with ENT next week. See if can arrange an earlier appt.  Follow.

## 2023-12-16 ENCOUNTER — Ambulatory Visit: Payer: BC Managed Care – PPO | Admitting: Internal Medicine

## 2023-12-17 ENCOUNTER — Encounter: Payer: Self-pay | Admitting: Internal Medicine

## 2023-12-17 NOTE — Telephone Encounter (Signed)
 I am ok to order labs. I can order a cbc. I do not know if she has any other specific labs she would like.  (Dx lymphadenopathy)

## 2023-12-18 ENCOUNTER — Other Ambulatory Visit: Payer: Self-pay

## 2023-12-18 DIAGNOSIS — R599 Enlarged lymph nodes, unspecified: Secondary | ICD-10-CM

## 2023-12-23 ENCOUNTER — Other Ambulatory Visit (INDEPENDENT_AMBULATORY_CARE_PROVIDER_SITE_OTHER): Payer: 59

## 2023-12-23 DIAGNOSIS — R599 Enlarged lymph nodes, unspecified: Secondary | ICD-10-CM | POA: Diagnosis not present

## 2023-12-24 ENCOUNTER — Encounter: Payer: Self-pay | Admitting: Internal Medicine

## 2023-12-24 LAB — CBC WITH DIFFERENTIAL/PLATELET
Basophils Absolute: 0 10*3/uL (ref 0.0–0.1)
Basophils Relative: 0.7 % (ref 0.0–3.0)
Eosinophils Absolute: 0.1 10*3/uL (ref 0.0–0.7)
Eosinophils Relative: 1.7 % (ref 0.0–5.0)
HCT: 39.4 % (ref 36.0–46.0)
Hemoglobin: 13.1 g/dL (ref 12.0–15.0)
Lymphocytes Relative: 34.4 % (ref 12.0–46.0)
Lymphs Abs: 2.1 10*3/uL (ref 0.7–4.0)
MCHC: 33.4 g/dL (ref 30.0–36.0)
MCV: 83.6 fL (ref 78.0–100.0)
Monocytes Absolute: 0.6 10*3/uL (ref 0.1–1.0)
Monocytes Relative: 9 % (ref 3.0–12.0)
Neutro Abs: 3.4 10*3/uL (ref 1.4–7.7)
Neutrophils Relative %: 54.2 % (ref 43.0–77.0)
Platelets: 352 10*3/uL (ref 150.0–400.0)
RBC: 4.71 Mil/uL (ref 3.87–5.11)
RDW: 13 % (ref 11.5–15.5)
WBC: 6.2 10*3/uL (ref 4.0–10.5)

## 2024-01-02 ENCOUNTER — Other Ambulatory Visit: Payer: Self-pay | Admitting: Internal Medicine

## 2024-01-02 NOTE — Telephone Encounter (Signed)
 Rx ok'd for zoloft 25mg  #90 with one refill.

## 2024-01-12 NOTE — Telephone Encounter (Signed)
 Please call her and let her know to keep the appt.  Thanks

## 2024-01-13 ENCOUNTER — Ambulatory Visit: Payer: BC Managed Care – PPO | Admitting: Internal Medicine

## 2024-01-15 ENCOUNTER — Ambulatory Visit (INDEPENDENT_AMBULATORY_CARE_PROVIDER_SITE_OTHER): Payer: BC Managed Care – PPO | Admitting: Internal Medicine

## 2024-01-15 VITALS — BP 128/70 | HR 86 | Temp 98.0°F | Resp 16 | Ht 62.0 in | Wt 137.6 lb

## 2024-01-15 DIAGNOSIS — Z8759 Personal history of other complications of pregnancy, childbirth and the puerperium: Secondary | ICD-10-CM

## 2024-01-15 DIAGNOSIS — F419 Anxiety disorder, unspecified: Secondary | ICD-10-CM

## 2024-01-15 DIAGNOSIS — I471 Supraventricular tachycardia, unspecified: Secondary | ICD-10-CM

## 2024-01-15 DIAGNOSIS — R591 Generalized enlarged lymph nodes: Secondary | ICD-10-CM | POA: Diagnosis not present

## 2024-01-15 DIAGNOSIS — O133 Gestational [pregnancy-induced] hypertension without significant proteinuria, third trimester: Secondary | ICD-10-CM

## 2024-01-15 MED ORDER — BUSPIRONE HCL 5 MG PO TABS
5.0000 mg | ORAL_TABLET | Freq: Every day | ORAL | 1 refills | Status: DC | PRN
Start: 2024-01-15 — End: 2024-02-08

## 2024-01-15 NOTE — Progress Notes (Signed)
 Subjective:    Patient ID: Sara Richardson, female    DOB: 1991-03-02, 33 y.o.   MRN: [id number]  Patient here for  Chief Complaint  Patient presents with   Medical Management of Chronic Issues    HPI Here for a scheduled follow up. Increased stress/anxiety. Started on zoloft last visit. Did not like the way it made her feel. Evaluated by ENT2/4/25 - persistent lymph node. Recommended to continue to follow. She reports persistent increased stress. Discussed. Discussed other treatment options - for example buspar.    Past Medical History:  Diagnosis Date   Anxiety    SVT (supraventricular tachycardia) (HCC)    no problem since last ablation in 2012   Past Surgical History:  Procedure Laterality Date   ABLATION  2012   x2 heart ablation   LAPAROSCOPIC OVARIAN CYSTECTOMY Left 11/09/2021   Procedure: LAPAROSCOPIC OVARIAN CYSTECTOMY;  Surgeon: Linzie Collin, MD;  Location: [facility];  Service: Gynecology;  Laterality: Left;   LAPAROSCOPIC UNILATERAL SALPINGECTOMY  11/09/2021   Procedure: LAPAROSCOPIC LEFT PARTIAL SALPINGECTOMY;  Surgeon: Linzie Collin, MD;  Location: [facility];  Service: Gynecology;;   Family History  Problem Relation Age of Onset   Breast cancer Maternal Grandmother        lung cancer    Prostate cancer Maternal Grandfather    Hypertension Mother    Heart Problems Father    Social History   Socioeconomic History   Marital status: Married    Spouse name: Rosalyn Gess   Number of children: Not on file   Years of education: Not on file   Highest education level: Not on file  Occupational History   Occupation: Runner, broadcasting/film/video    CommentHorticulturist, commercial - second grade  Tobacco Use   Smoking status: Never   Smokeless tobacco: Never  Vaping Use   Vaping status: Never Used  Substance and Sexual Activity   Alcohol use: Not Currently   Drug use: No   Sexual activity: Yes    Partners: Male    Birth control/protection: Condom  Other Topics Concern   Not on file   Social History Narrative   Not on file   Social Drivers of Health   Financial Resource Strain: Low Risk  (12/24/2023)   Received from [facility]   Overall Financial Resource Strain (CARDIA)    Difficulty of Paying Living Expenses: Not hard at all  Food Insecurity: No Food Insecurity (12/24/2023)   Received from [facility]   Hunger Vital Sign    Worried About Running Out of Food in the Last Year: Never true    Ran Out of Food in the Last Year: Never true  Transportation Needs: No Transportation Needs (12/24/2023)   Received from [facility] - Transportation    In the past 12 months, has lack of transportation kept you from medical appointments or from getting medications?: No    Lack of Transportation (Non-Medical): No  Physical Activity: Not on file  Stress: Not on file  Social Connections: Not on file     Review of Systems  Constitutional:  Negative for appetite change and unexpected weight change.  HENT:  Negative for congestion and sinus pressure.   Respiratory:  Negative for cough, chest tightness and shortness of breath.   Cardiovascular:  Negative for chest pain and leg swelling.       Some increased heart racing.   Gastrointestinal:  Negative for diarrhea, nausea and vomiting.  Genitourinary:  Negative for difficulty urinating and dysuria.  Musculoskeletal:  Negative for joint swelling and myalgias.  Skin:  Negative for color change and rash.  Neurological:  Negative for dizziness and headaches.  Psychiatric/Behavioral:  Negative for agitation and dysphoric mood.        Objective:     BP 128/70   Pulse 86   Temp 98 F (36.7 C)   Resp 16   Ht 5\' 2"  (1.575 m)   Wt 137 lb 9.6 oz (62.4 kg)   SpO2 99%   BMI 25.17 kg/m  Wt Readings from Last 3 Encounters:  01/15/24 137 lb 9.6 oz (62.4 kg)  12/08/23 138 lb (62.6 kg)  11/19/23 136 lb (61.7 kg)    Physical Exam Vitals reviewed.  Constitutional:       General: She is not in acute distress.    Appearance: Normal appearance.  HENT:     Head: Normocephalic and atraumatic.     Right Ear: External ear normal.     Left Ear: External ear normal.     Mouth/Throat:     Pharynx: No oropharyngeal exudate or posterior oropharyngeal erythema.  Eyes:     General: No scleral icterus.       Right eye: No discharge.        Left eye: No discharge.     Conjunctiva/sclera: Conjunctivae normal.  Neck:     Thyroid: No thyromegaly.  Cardiovascular:     Rate and Rhythm: Normal rate and regular rhythm.  Pulmonary:     Effort: No respiratory distress.     Breath sounds: Normal breath sounds. No wheezing.  Abdominal:     General: Bowel sounds are normal.     Palpations: Abdomen is soft.     Tenderness: There is no abdominal tenderness.  Musculoskeletal:        General: No swelling or tenderness.     Cervical back: Neck supple. No tenderness.  Lymphadenopathy:     Cervical: No cervical adenopathy.  Skin:    Findings: No erythema or rash.  Neurological:     Mental Status: She is alert.  Psychiatric:        Mood and Affect: Mood normal.        Behavior: Behavior normal.         Outpatient Encounter Medications as of 01/15/2024  Medication Sig   busPIRone (BUSPAR) 5 MG tablet Take 1 tablet (5 mg total) by mouth daily as needed.   clonazePAM (KLONOPIN) 0.5 MG tablet Take 1/2 - 1 tablet q day prn.   [DISCONTINUED] sertraline (ZOLOFT) 25 MG tablet TAKE 1 TABLET (25 MG TOTAL) BY MOUTH DAILY.   No facility-administered encounter medications on file as of 01/15/2024.     Lab Results  Component Value Date   WBC 6.2 12/23/2023   HGB 13.1 12/23/2023   HCT 39.4 12/23/2023   PLT 352.0 12/23/2023   GLUCOSE 87 12/13/2021   ALT 10 12/13/2021   AST 12 12/13/2021   NA 137 12/13/2021   K 3.8 12/13/2021   CL 99 12/13/2021   CREATININE 0.68 12/13/2021   BUN 16 12/13/2021   CO2 28 12/13/2021   TSH 1.62 12/13/2021       Assessment & Plan:   SVT (supraventricular tachycardia) (HCC) Assessment & Plan: Has a history of SVT.  Currently relatively stable. Treat with buspar as outlined.    Lymphadenopathy Assessment & Plan: Saw ENT. Recommended to continue to follow. No significant fullness noted on exam today.  History of gestational hypertension Assessment & Plan: Blood pressure doing well.    Anxiety Assessment & Plan: Discussed treatment options. Did not tolerate zoloft. Discussed other treatment options. Trial of buspar,    Other orders -     busPIRone HCl; Take 1 tablet (5 mg total) by mouth daily as needed.  Dispense: 30 tablet; Refill: 1     Dale , MD

## 2024-01-16 NOTE — Telephone Encounter (Signed)
 I would hold on the ashewagandha. Ok to take the magnesium and buspar.

## 2024-01-18 ENCOUNTER — Encounter: Payer: Self-pay | Admitting: Internal Medicine

## 2024-01-18 NOTE — Assessment & Plan Note (Signed)
 Blood pressure doing well.

## 2024-01-18 NOTE — Assessment & Plan Note (Signed)
 Saw ENT. Recommended to continue to follow. No significant fullness noted on exam today.

## 2024-01-18 NOTE — Assessment & Plan Note (Signed)
 Has a history of SVT.  Currently relatively stable. Treat with buspar as outlined.

## 2024-01-18 NOTE — Assessment & Plan Note (Signed)
 Discussed treatment options. Did not tolerate zoloft. Discussed other treatment options. Trial of buspar,

## 2024-01-19 ENCOUNTER — Encounter: Payer: Self-pay | Admitting: Obstetrics and Gynecology

## 2024-02-06 ENCOUNTER — Other Ambulatory Visit: Payer: Self-pay | Admitting: Internal Medicine

## 2024-02-08 NOTE — Telephone Encounter (Signed)
 Rx ok'd for buspar #90 with no refills.

## 2024-03-16 ENCOUNTER — Ambulatory Visit: Payer: BC Managed Care – PPO | Admitting: Internal Medicine

## 2024-03-19 ENCOUNTER — Encounter: Payer: Self-pay | Admitting: Internal Medicine

## 2024-03-19 ENCOUNTER — Ambulatory Visit: Admitting: Internal Medicine

## 2024-03-19 VITALS — BP 118/76 | HR 90 | Ht 62.0 in | Wt 137.6 lb

## 2024-03-19 DIAGNOSIS — Z8759 Personal history of other complications of pregnancy, childbirth and the puerperium: Secondary | ICD-10-CM

## 2024-03-19 DIAGNOSIS — R599 Enlarged lymph nodes, unspecified: Secondary | ICD-10-CM

## 2024-03-19 DIAGNOSIS — Z13220 Encounter for screening for lipoid disorders: Secondary | ICD-10-CM

## 2024-03-19 DIAGNOSIS — F419 Anxiety disorder, unspecified: Secondary | ICD-10-CM | POA: Diagnosis not present

## 2024-03-19 DIAGNOSIS — R718 Other abnormality of red blood cells: Secondary | ICD-10-CM | POA: Diagnosis not present

## 2024-03-19 DIAGNOSIS — I471 Supraventricular tachycardia, unspecified: Secondary | ICD-10-CM

## 2024-03-19 DIAGNOSIS — R0602 Shortness of breath: Secondary | ICD-10-CM

## 2024-03-19 LAB — CBC WITH DIFFERENTIAL/PLATELET
Basophils Absolute: 0 10*3/uL (ref 0.0–0.1)
Basophils Relative: 0.6 % (ref 0.0–3.0)
Eosinophils Absolute: 0.1 10*3/uL (ref 0.0–0.7)
Eosinophils Relative: 2.3 % (ref 0.0–5.0)
HCT: 42.1 % (ref 36.0–46.0)
Hemoglobin: 14 g/dL (ref 12.0–15.0)
Lymphocytes Relative: 36.8 % (ref 12.0–46.0)
Lymphs Abs: 1.9 10*3/uL (ref 0.7–4.0)
MCHC: 33.3 g/dL (ref 30.0–36.0)
MCV: 82.9 fl (ref 78.0–100.0)
Monocytes Absolute: 0.5 10*3/uL (ref 0.1–1.0)
Monocytes Relative: 9.6 % (ref 3.0–12.0)
Neutro Abs: 2.6 10*3/uL (ref 1.4–7.7)
Neutrophils Relative %: 50.7 % (ref 43.0–77.0)
Platelets: 311 10*3/uL (ref 150.0–400.0)
RBC: 5.07 Mil/uL (ref 3.87–5.11)
RDW: 13.1 % (ref 11.5–15.5)
WBC: 5.1 10*3/uL (ref 4.0–10.5)

## 2024-03-19 LAB — COMPREHENSIVE METABOLIC PANEL WITH GFR
ALT: 9 U/L (ref 0–35)
AST: 15 U/L (ref 0–37)
Albumin: 4.7 g/dL (ref 3.5–5.2)
Alkaline Phosphatase: 38 U/L — ABNORMAL LOW (ref 39–117)
BUN: 13 mg/dL (ref 6–23)
CO2: 27 meq/L (ref 19–32)
Calcium: 9.4 mg/dL (ref 8.4–10.5)
Chloride: 104 meq/L (ref 96–112)
Creatinine, Ser: 0.71 mg/dL (ref 0.40–1.20)
GFR: 113.08 mL/min (ref >60.00)
Glucose, Bld: 98 mg/dL (ref 70–99)
Potassium: 4 meq/L (ref 3.5–5.1)
Sodium: 138 meq/L (ref 135–145)
Total Bilirubin: 0.4 mg/dL (ref 0.2–1.2)
Total Protein: 7.5 g/dL (ref 6.0–8.3)

## 2024-03-19 LAB — LIPID PANEL
Cholesterol: 170 mg/dL (ref 0–200)
HDL: 47.4 mg/dL (ref >39.00)
LDL Cholesterol: 99 mg/dL (ref 0–99)
NonHDL: 122.41
Total CHOL/HDL Ratio: 4
Triglycerides: 119 mg/dL (ref 0.0–149.0)
VLDL: 23.8 mg/dL (ref 0.0–40.0)

## 2024-03-19 LAB — IBC + FERRITIN
Ferritin: 15.3 ng/mL (ref 10.0–291.0)
Iron: 66 ug/dL (ref 42–145)
Saturation Ratios: 16.4 % — ABNORMAL LOW (ref 20.0–50.0)
TIBC: 403.2 ug/dL (ref 250.0–450.0)
Transferrin: 288 mg/dL (ref 212.0–360.0)

## 2024-03-19 LAB — TSH: TSH: 1.8 u[IU]/mL (ref 0.35–5.50)

## 2024-03-19 NOTE — Assessment & Plan Note (Signed)
 Has a history of SVT.  Appears to be stable. Follow.

## 2024-03-19 NOTE — Progress Notes (Signed)
 Subjective:    Patient ID: Sara Richardson, female    DOB: May 12, 1991, 33 y.o.   MRN: [id number]  Patient here for  Chief Complaint  Patient presents with   Medical Management of Chronic Issues    8 week follow up on medications    HPI Here for a scheduled follow up - follow up regarding increased stress/anxiety. Did not tolerate zoloft . Last visit, started on buspar . Feels buspar  is helping. Not taking regularly. Still with increased stress.  Discussed. Discussed taking buspar  on a regular basis. Is trying to exercise regularly. Has not been doing yoga, but does work out - aerobic exercise.  Feels better after she exercises. Does report feeling at times like she is not able to get a good breath. Notices when anxious, but also notices when eating. Some acid reflux at times. Takes TUMS prn. Has to eat slowly and chew food well. Also has seen ENT to follow up regarding lymphadenopathy. Stable. No further w/up felt warranted.    Past Medical History:  Diagnosis Date   Anxiety    SVT (supraventricular tachycardia) (HCC)    no problem since last ablation in 2012   Past Surgical History:  Procedure Laterality Date   ABLATION  2012   x2 heart ablation   LAPAROSCOPIC OVARIAN CYSTECTOMY Left 11/09/2021   Procedure: LAPAROSCOPIC OVARIAN CYSTECTOMY;  Surgeon: Zenobia Hila, MD;  Location: [facility] ORS;  Service: Gynecology;  Laterality: Left;   LAPAROSCOPIC UNILATERAL SALPINGECTOMY  11/09/2021   Procedure: LAPAROSCOPIC LEFT PARTIAL SALPINGECTOMY;  Surgeon: Zenobia Hila, MD;  Location: [facility] ORS;  Service: Gynecology;;   Family History  Problem Relation Age of Onset   Breast cancer Maternal Grandmother        lung cancer    Prostate cancer Maternal Grandfather    Hypertension Mother    Heart Problems Father    Social History   Socioeconomic History   Marital status: Married    Spouse name: Annelle Barrs   Number of children: Not on file   Years of education: Not on file   Highest  education level: Not on file  Occupational History   Occupation: Runner, broadcasting/film/video    CommentHorticulturist, commercial - second grade  Tobacco Use   Smoking status: Never   Smokeless tobacco: Never  Vaping Use   Vaping status: Never Used  Substance and Sexual Activity   Alcohol use: Not Currently   Drug use: No   Sexual activity: Yes    Partners: Male    Birth control/protection: Condom  Other Topics Concern   Not on file  Social History Narrative   Not on file   Social Drivers of Health   Financial Resource Strain: Low Risk  (12/24/2023)   Received from [facility]   Overall Financial Resource Strain (CARDIA)    Difficulty of Paying Living Expenses: Not hard at all  Food Insecurity: No Food Insecurity (12/24/2023)   Received from [facility]   Hunger Vital Sign    Worried About Running Out of Food in the Last Year: Never true    Ran Out of Food in the Last Year: Never true  Transportation Needs: No Transportation Needs (12/24/2023)   Received from [facility] - Transportation    In the past 12 months, has lack of transportation kept you from medical appointments or from getting medications?: No    Lack of Transportation (Non-Medical): No  Physical Activity: Not on file  Stress: Not on  file  Social Connections: Not on file     Review of Systems  Constitutional:  Negative for appetite change and unexpected weight change.  HENT:  Negative for congestion and sinus pressure.        Some drainage at times.   Respiratory:  Negative for cough, chest tightness and shortness of breath.   Cardiovascular:  Negative for chest pain, palpitations and leg swelling.  Gastrointestinal:  Negative for abdominal pain, diarrhea, nausea and vomiting.  Genitourinary:  Negative for difficulty urinating and dysuria.  Musculoskeletal:  Negative for joint swelling and myalgias.  Skin:  Negative for color change and rash.  Neurological:  Negative for  dizziness and headaches.  Psychiatric/Behavioral:  Negative for agitation and dysphoric mood.        Objective:     BP 118/76   Pulse 90   Ht 5\' 2"  (1.575 m)   Wt 137 lb 9.6 oz (62.4 kg)   SpO2 99%   BMI 25.17 kg/m  Wt Readings from Last 3 Encounters:  03/19/24 137 lb 9.6 oz (62.4 kg)  01/15/24 137 lb 9.6 oz (62.4 kg)  12/08/23 138 lb (62.6 kg)    Physical Exam Vitals reviewed.  Constitutional:      General: She is not in acute distress.    Appearance: Normal appearance.  HENT:     Head: Normocephalic and atraumatic.     Right Ear: External ear normal.     Left Ear: External ear normal.     Mouth/Throat:     Pharynx: No oropharyngeal exudate or posterior oropharyngeal erythema.  Eyes:     General: No scleral icterus.       Right eye: No discharge.        Left eye: No discharge.     Conjunctiva/sclera: Conjunctivae normal.  Neck:     Thyroid: No thyromegaly.  Cardiovascular:     Rate and Rhythm: Normal rate and regular rhythm.  Pulmonary:     Effort: No respiratory distress.     Breath sounds: Normal breath sounds. No wheezing.  Abdominal:     General: Bowel sounds are normal.     Palpations: Abdomen is soft.     Tenderness: There is no abdominal tenderness.  Musculoskeletal:        General: No swelling or tenderness.     Cervical back: Neck supple. No tenderness.  Lymphadenopathy:     Cervical: No cervical adenopathy.  Skin:    Findings: No erythema or rash.  Neurological:     Mental Status: She is alert.  Psychiatric:        Mood and Affect: Mood normal.        Behavior: Behavior normal.         Outpatient Encounter Medications as of 03/19/2024  Medication Sig   busPIRone  (BUSPAR ) 5 MG tablet TAKE 1 TABLET BY MOUTH DAILY AS NEEDED.   clonazePAM  (KLONOPIN ) 0.5 MG tablet Take 1/2 - 1 tablet q day prn.   No facility-administered encounter medications on file as of 03/19/2024.     Lab Results  Component Value Date   WBC 6.2 12/23/2023   HGB 13.1  12/23/2023   HCT 39.4 12/23/2023   PLT 352.0 12/23/2023   GLUCOSE 87 12/13/2021   ALT 10 12/13/2021   AST 12 12/13/2021   NA 137 12/13/2021   K 3.8 12/13/2021   CL 99 12/13/2021   CREATININE 0.68 12/13/2021   BUN 16 12/13/2021   CO2 28 12/13/2021   TSH 1.62 12/13/2021  Assessment & Plan:  Microcytosis Assessment & Plan: Check cbc and iron studies.   Orders: -     CBC with Differential/Platelet -     IBC + Ferritin  Anxiety Assessment & Plan: Discussed with her today. Buspar  is helping. She overall feels better. Discussed taking on a more regular basis. Follow.    Orders: -     TSH -     Comprehensive metabolic panel with GFR  Screening cholesterol level -     Lipid panel  Enlarged lymph node Assessment & Plan: Saw ENT. Felt improved. Recommended no further w/up.    History of gestational hypertension Assessment & Plan: Blood pressure doing well.    SVT (supraventricular tachycardia) (HCC) Assessment & Plan: Has a history of SVT.  Appears to be stable. Follow.    SOB (shortness of breath) Assessment & Plan: Discussed.  Actually symptoms are described as not feeling like she is getting a good breath in at times. Notices more when anxious or can occur when eating. Acid reflux as outlined. Takes TUMS prn. Treat with pepcid  20mg  as directed. Follow. Call with update.       Dellar Fenton, MD

## 2024-03-19 NOTE — Patient Instructions (Signed)
 Take pepcid  20mg  - 30 minutes before eating.

## 2024-03-19 NOTE — Assessment & Plan Note (Signed)
 Saw ENT. Felt improved. Recommended no further w/up.

## 2024-03-19 NOTE — Assessment & Plan Note (Signed)
 Discussed.  Actually symptoms are described as not feeling like she is getting a good breath in at times. Notices more when anxious or can occur when eating. Acid reflux as outlined. Takes TUMS prn. Treat with pepcid  20mg  as directed. Follow. Call with update.

## 2024-03-19 NOTE — Assessment & Plan Note (Signed)
 Blood pressure doing well.

## 2024-03-19 NOTE — Assessment & Plan Note (Signed)
 Discussed with her today. Buspar  is helping. She overall feels better. Discussed taking on a more regular basis. Follow.

## 2024-03-19 NOTE — Assessment & Plan Note (Signed)
Check cbc and iron studies.

## 2024-03-22 ENCOUNTER — Other Ambulatory Visit: Payer: Self-pay | Admitting: Internal Medicine

## 2024-03-22 DIAGNOSIS — R790 Abnormal level of blood mineral: Secondary | ICD-10-CM

## 2024-03-22 NOTE — Telephone Encounter (Signed)
 See result note.

## 2024-03-22 NOTE — Telephone Encounter (Signed)
 Please see lab result note. Please notify her that even though her iron stores are low range, she is maintaining her hgb at a good level. Can try a trial of iron supplement to see if helps with symptoms. See lab result note.

## 2024-03-22 NOTE — Progress Notes (Signed)
Orders placed for f/u labs.  

## 2024-04-14 ENCOUNTER — Encounter: Payer: Self-pay | Admitting: Internal Medicine

## 2024-04-14 ENCOUNTER — Other Ambulatory Visit: Payer: Self-pay

## 2024-04-14 ENCOUNTER — Emergency Department

## 2024-04-14 ENCOUNTER — Ambulatory Visit: Payer: Self-pay

## 2024-04-14 ENCOUNTER — Emergency Department
Admission: EM | Admit: 2024-04-14 | Discharge: 2024-04-14 | Disposition: A | Discharge status: Home / Self Care | Attending: Emergency Medicine | Admitting: Emergency Medicine

## 2024-04-14 ENCOUNTER — Ambulatory Visit: Admitting: Internal Medicine

## 2024-04-14 DIAGNOSIS — E876 Hypokalemia: Secondary | ICD-10-CM | POA: Diagnosis not present

## 2024-04-14 DIAGNOSIS — R002 Palpitations: Secondary | ICD-10-CM | POA: Insufficient documentation

## 2024-04-14 DIAGNOSIS — R000 Tachycardia, unspecified: Secondary | ICD-10-CM | POA: Insufficient documentation

## 2024-04-14 DIAGNOSIS — Z8679 Personal history of other diseases of the circulatory system: Secondary | ICD-10-CM | POA: Insufficient documentation

## 2024-04-14 LAB — CBC
HCT: 44 % (ref 36.0–46.0)
Hemoglobin: 14.2 g/dL (ref 12.0–15.0)
MCH: 27.1 pg (ref 26.0–34.0)
MCHC: 32.3 g/dL (ref 30.0–36.0)
MCV: 84 fL (ref 80.0–100.0)
Platelets: 354 10*3/uL (ref 150–400)
RBC: 5.24 MIL/uL — ABNORMAL HIGH (ref 3.87–5.11)
RDW: 12.8 % (ref 11.5–15.5)
WBC: 7.2 10*3/uL (ref 4.0–10.5)
nRBC: 0 % (ref 0.0–0.2)

## 2024-04-14 LAB — BASIC METABOLIC PANEL WITH GFR
Anion gap: 4 — ABNORMAL LOW (ref 5–15)
BUN: 11 mg/dL (ref 6–20)
CO2: 29 mmol/L (ref 22–32)
Calcium: 9.7 mg/dL (ref 8.9–10.3)
Chloride: 106 mmol/L (ref 98–111)
Creatinine, Ser: 0.7 mg/dL (ref 0.44–1.00)
GFR, Estimated: >60 mL/min (ref >60)
Glucose, Bld: 118 mg/dL — ABNORMAL HIGH (ref 70–99)
Potassium: 3.4 mmol/L — ABNORMAL LOW (ref 3.5–5.1)
Sodium: 139 mmol/L (ref 135–145)

## 2024-04-14 LAB — TROPONIN I (HIGH SENSITIVITY): Troponin I (High Sensitivity): <2 ng/L (ref <18)

## 2024-04-14 NOTE — Telephone Encounter (Signed)
 Please call and notify Sara Richardson that I reviewed her labs from her ED visit and her potassium is slightly decreased. Inform her and send information on foods with increased potassium. We will follow.  Recheck potassium in 10-14 days. Hgb and kidney function wnl. Troponin (heart test) - wnl. Please confirm she is doing ok. Per ER note, they are referring her to cardiology. Let us  know if she does not hear about an appt.

## 2024-04-14 NOTE — Telephone Encounter (Signed)
 FYI to provider  Patient was last seen in office 03/19/24. Pt called in for triage for Chest Pain. No interventions have been attempted. Symptoms began Saturday morning, intermittent.   Triage Disposition: See Physician within 24 hours.  Same Day Scheduled  Copied from CRM [id number]. Topic: Clinical - Red Word Triage >> Apr 14, 2024  7:46 AM Sara Richardson wrote: Red Word that prompted transfer to Nurse Triage: Chest pain.. Lifted something heavy friday and she now has chest pain. Today she experienced heart flutters. She feels nervous from that feeling Reason for Disposition  [1] Chest pain lasts > 5 minutes AND [2] occurred > 3 days ago (72 hours) AND [3] NO chest pain or cardiac symptoms now  Answer Assessment - Initial Assessment Questions 1. LOCATION: "Where does it hurt?"       Center-Left, primarily with movement/stretch 2. RADIATION: "Does the pain go anywhere else?" (e.g., into neck, jaw, arms, back)     "Sometimes my left arm"  3. ONSET: "When did the chest pain begin?" (Minutes, hours or days)      Began Saturday morning, lifted something heavy Friday 4. PATTERN: "Does the pain come and go, or has it been constant since it started?"  "Does it get worse with exertion?"      Intermittent 5. DURATION: "How long does it last" (e.g., seconds, minutes, hours)     Unknown, mainly with movement 6. SEVERITY: "How bad is the pain?"  (e.g., Scale 1-10; mild, moderate, or severe)    - MILD (1-3): doesn't interfere with normal activities     - MODERATE (4-7): interferes with normal activities or awakens from sleep    - SEVERE (8-10): excruciating pain, unable to do any normal activities       2/10 9. CAUSE: "What do you think is causing the chest pain?"     Unknown 10. OTHER SYMPTOMS: "Do you have any other symptoms?" (e.g., dizziness, nausea, vomiting, sweating, fever, difficulty breathing, cough)       "Hard to take a deep breath"  Protocols used: Chest Pain-A-AH

## 2024-04-14 NOTE — ED Provider Notes (Signed)
 [facility] Provider Note   Event Date/Time   First MD Initiated Contact with Patient 04/14/24 1024     (approximate) History  No chief complaint on file.  HPI Sara Richardson is a 33 y.o. female with a stated past medical history of paroxysmal SVT status post 2 ablations who presents complaining of palpitations with chest soreness in the central portion of her chest over the last 4 days.  Patient states this pain occasionally runs down her left arm as well.  Patient denies any exertional worsening of this pain however she does state that taking a deep breath and pressing on the front of her chest worsens this pain. ROS: Patient currently denies any vision changes, tinnitus, difficulty speaking, facial droop, sore throat, shortness of breath, abdominal pain, nausea/vomiting/diarrhea, dysuria, or weakness/numbness/paresthesias in any extremity   Physical Exam  Triage Vital Signs: ED Triage Vitals  Encounter Vitals Group     BP 04/14/24 0945 (!) 138/93     Systolic BP Percentile --      Diastolic BP Percentile --      Pulse Rate 04/14/24 0945 (!) 119     Resp 04/14/24 0945 18     Temp 04/14/24 0945 97.7 F (36.5 C)     Temp Source 04/14/24 0945 Oral     SpO2 04/14/24 0945 100 %     Weight 04/14/24 0944 137 lb 9.1 oz (62.4 kg)     Height 04/14/24 0944 5\' 2"  (1.575 m)     Head Circumference --      Peak Flow --      Pain Score 04/14/24 0943 0     Pain Loc --      Pain Education --      Exclude from Growth Chart --    Most recent vital signs: Vitals:   04/14/24 0945  BP: (!) 138/93  Pulse: (!) 119  Resp: 18  Temp: 97.7 F (36.5 C)  SpO2: 100%   General: Awake, oriented x4. CV:  Good peripheral perfusion. Resp:  Normal effort. Abd:  No distention. Other:   resting comfortably in no acute distress ED Results / Procedures / Treatments  Labs (all labs ordered are listed, but only abnormal results are displayed) Labs Reviewed  BASIC METABOLIC PANEL  WITH GFR - Abnormal; Notable for the following components:      Result Value   Potassium 3.4 (*)    Glucose, Bld 118 (*)    Anion gap 4 (*)    All other components within normal limits  CBC - Abnormal; Notable for the following components:   RBC 5.24 (*)    All other components within normal limits  POC URINE PREG, ED  TROPONIN I (HIGH SENSITIVITY)   EKG ED ECG REPORT I, Charleen Conn, the attending physician, personally viewed and interpreted this ECG. Date: 04/14/2024 EKG Time: 1045 Rate: 118 Rhythm: Tachycardic sinus rhythm QRS Axis: normal Intervals: normal ST/T Wave abnormalities: normal Narrative Interpretation: Tachycardic sinus rhythm.  No evidence of acute ischemia RADIOLOGY ED MD interpretation: 2 view chest x-ray interpreted by me shows no evidence of acute abnormalities including no pneumonia, pneumothorax, or widened mediastinum - All radiology independently interpreted and agree with radiology assessment Official radiology report(s): DG Chest 2 View Result Date: 04/14/2024 CLINICAL DATA:  Chest pain EXAM: CHEST - 2 VIEW COMPARISON:  None Available. FINDINGS: The heart size and mediastinal contours are within normal limits. Both lungs are clear. The visualized skeletal structures are unremarkable. IMPRESSION:  No active cardiopulmonary disease. Electronically Signed   By: Fredrich Jefferson M.D.   On: 04/14/2024 10:08   PROCEDURES: Critical Care performed: No .1-3 Lead EKG Interpretation  Performed by: Charleen Conn, MD Authorized by: Charleen Conn, MD     Interpretation: normal     ECG rate:  91   ECG rate assessment: normal     Rhythm: sinus rhythm     Ectopy: none     Conduction: normal    MEDICATIONS ORDERED IN ED: Medications - No data to display IMPRESSION / MDM / ASSESSMENT AND PLAN / ED COURSE  I reviewed the triage vital signs and the nursing notes.                             The patient is on the cardiac monitor to evaluate for evidence of  arrhythmia and/or significant heart rate changes. Patient's presentation is most consistent with acute presentation with potential threat to life or bodily function. 33 year old female presents with palpitations. EKG: No STEMI and no evidence of Brugada's sign, delta wave, epsilon wave, significantly prolonged QTc, or malignant arrhythmia. Based on H&P and testing, this patient appears to be low risk for emergent causes of palpitations such as, but not limited to, a malignant cardiac arrhythmia, ACS, pulmonary embolism, thyrotoxicosis, PNA, PTX.  The patient has been given strict return precautions and understands the need for further outpatient testing and treatment. Dispo: Discharge home with PCP and cardiology follow-up   FINAL CLINICAL IMPRESSION(S) / ED DIAGNOSES   Final diagnoses:  Palpitations  Sinus tachycardia  History of supraventricular tachycardia   Rx / DC Orders   ED Discharge Orders          Ordered    Ambulatory referral to Cardiology       Comments: If you have not heard from the Cardiology office within the next 72 hours please call [phone].   04/14/24 1057           Note:  This document was prepared using Dragon voice recognition software and may include unintentional dictation errors.   Bradie Sangiovanni K, MD 04/14/24 1100

## 2024-04-14 NOTE — ED Triage Notes (Signed)
 Pt here with left sided cp that radiates to her arm that started Sat. Pt also having palpitations and fluttering that started this morning. Pt also having tingling, denies numbness. Pt has a hx of SVT but not on medications. Pt took Klonopin  this morning.  95-100 HR 118/73

## 2024-04-15 ENCOUNTER — Other Ambulatory Visit: Payer: Self-pay

## 2024-04-15 DIAGNOSIS — E876 Hypokalemia: Secondary | ICD-10-CM

## 2024-04-15 NOTE — Telephone Encounter (Signed)
 Pt doing ok. Has cardio appt set up already. Coming in next Friday for labs. Will repeat other labs while here.

## 2024-04-21 NOTE — Telephone Encounter (Signed)
 Called patient to discuss,  she is going to track her cycles and discuss with Dr Geralyn Knee at July appt.

## 2024-04-29 ENCOUNTER — Ambulatory Visit: Admitting: Internal Medicine

## 2024-04-29 ENCOUNTER — Encounter: Payer: Self-pay | Admitting: Internal Medicine

## 2024-04-29 VITALS — BP 124/62 | HR 83 | Ht 62.0 in | Wt 136.8 lb

## 2024-04-29 DIAGNOSIS — R002 Palpitations: Secondary | ICD-10-CM

## 2024-04-29 DIAGNOSIS — I471 Supraventricular tachycardia, unspecified: Secondary | ICD-10-CM

## 2024-04-29 DIAGNOSIS — R079 Chest pain, unspecified: Secondary | ICD-10-CM

## 2024-04-29 NOTE — Patient Instructions (Signed)
 Medication Instructions:  Your physician recommends that you continue on your current medications as directed. Please refer to the Current Medication list given to you today.    *If you need a refill on your cardiac medications before your next appointment, please call your pharmacy*  Lab Work: No labs ordered today    Testing/Procedures: No test ordered today   Follow-Up: At [facility], you and your health needs are our priority.  As part of our continuing mission to provide you with exceptional heart care, our providers are all part of one team.  This team includes your primary Cardiologist (physician) and Advanced Practice Providers or APPs (Physician Assistants and Nurse Practitioners) who all work together to provide you with the care you need, when you need it.  Your next appointment:   2-3 month(s)  Provider:   You may see Sammy Crisp, MD or one of the following Advanced Practice Providers on your designated Care Team:   Laneta Pintos, NP Gildardo Labrador, PA-C Varney Gentleman, PA-C Cadence [place], PA-C Ronald Cockayne, NP Morey Ar, NP

## 2024-04-29 NOTE — Progress Notes (Signed)
 Cardiology Office Note:  .   Date:  05/01/2024  ID:  Sara Richardson, DOB 28-Sep-1991, MRN [id number] PCP: Glendia Shad, MD  [facility] Providers Cardiologist:  None     History of Present Illness: Sara Richardson is a 33 y.o. female with history of PSVT status post ablation x 2 at [facility] and anxiety, who has been referred by Dr. Jossie for evaluation of palpitations.  She presented to [facility] clinic urgent care on 04/14/2024 complaining of chest discomfort and palpitations.  She was referred to the ER, where workup was largely unrevealing.  EKG showed sinus tachycardia with rightward axis.  Mild hypokalemia was noted.  Sara Richardson reports feeling well today.  Leading up to her ED visit earlier this month, she experienced off and on chest discomfort that felt like a sore muscle but was not tender to palpation.  She had pulled a heavy object the day before the pain began, prompting her to speculate that this may have brought about the discomfort.  The pain was worsened by deep inspiration and sometimes also felt a bit heavy.  She also noted associated tingling and discomfort in the left arm at times.  The symptoms were not worsened by exertion.  In fact, she went to an exercise class and actually felt better afterwards.  On the day before her ED visit, she noted a brief flutter in her chest.  It did not feel like her episodes of SVT as a child.  Due to continued chest pain and some dizziness, she presented to the ED.  She was concerned about elevated heart rates that persisted for several hours even after she left the ED.  However, since then her symptoms have gradually abated.  Ms. Villescas denies any heart issues since her SVT ablations at [facility] as a child.  She denies shortness of breath but sometimes feels like she needs to take a deep breath but still feels hungry for air.  This seems to occur when she is stressed.  She has mild dependent edema in the left ankle at the [place]i Motter of the day that  began during her pregnancy 2-3 years ago.  ROS: See HPI  Studies Reviewed: Sara   EKG Interpretation Date/Time:  Thursday April 29 2024 08:44:39 EDT Ventricular Rate:  83 PR Interval:  142 QRS Duration:  80 QT Interval:  348 QTC Calculation: 408 R Axis:   86  Text Interpretation: Normal sinus rhythm Normal ECG When compared with ECG of 14-Apr-2024 10:45, HEART RATE has decreased Rightward axis is no longer Present Confirmed by Reagyn Facemire, Lonni [phone]) on 04/29/2024 8:50:13 AM    Risk Assessment/Calculations:         Physical Exam:   VS:  BP 124/62 (BP Location: Left Arm, Patient Position: Sitting, Cuff Size: Normal)   Pulse 83   Ht 5' 2 (1.575 m)   Wt 136 lb 12.8 oz (62.1 kg)   SpO2 100%   BMI 25.02 kg/m    Wt Readings from Last 3 Encounters:  04/29/24 136 lb 12.8 oz (62.1 kg)  04/14/24 137 lb 9.1 oz (62.4 kg)  03/19/24 137 lb 9.6 oz (62.4 kg)    General:  NAD. Neck: No JVD or HJR. Lungs: Clear to auscultation bilaterally without wheezes or crackles. Heart: Regular rate and rhythm without murmurs, rubs, or gallops Abdomen: Soft, nontender, nondistended. Extremities: No lower extremity edema.  ASSESSMENT AND PLAN: .    Chest pain and palpitations: Symptoms began after moving a heavy  box suggesting MSK etiology.  ED workup was unrevealing other than sinus tachycardia on EKG and borderline hypokalemia.  We have discussed further evaluation including echocardiography and ambulatory cardiac monitoring; given resolution of symptoms and reassuring evaluations in the ED and office today, we have agreed to defer additional testing.    Dispo: Return to clinic in 2-3 months to reassess symptoms.  Signed, Lonni Hanson, MD

## 2024-04-30 ENCOUNTER — Other Ambulatory Visit

## 2024-04-30 ENCOUNTER — Other Ambulatory Visit (INDEPENDENT_AMBULATORY_CARE_PROVIDER_SITE_OTHER)

## 2024-04-30 DIAGNOSIS — R319 Hematuria, unspecified: Secondary | ICD-10-CM

## 2024-04-30 DIAGNOSIS — R790 Abnormal level of blood mineral: Secondary | ICD-10-CM | POA: Diagnosis not present

## 2024-04-30 DIAGNOSIS — E876 Hypokalemia: Secondary | ICD-10-CM | POA: Diagnosis not present

## 2024-04-30 LAB — CBC WITH DIFFERENTIAL/PLATELET
Basophils Absolute: 0 10*3/uL (ref 0.0–0.1)
Basophils Relative: 0.6 % (ref 0.0–3.0)
Eosinophils Absolute: 0.1 10*3/uL (ref 0.0–0.7)
Eosinophils Relative: 1.9 % (ref 0.0–5.0)
HCT: 43.8 % (ref 36.0–46.0)
Hemoglobin: 14.5 g/dL (ref 12.0–15.0)
Lymphocytes Relative: 37.4 % (ref 12.0–46.0)
Lymphs Abs: 2.2 10*3/uL (ref 0.7–4.0)
MCHC: 33.2 g/dL (ref 30.0–36.0)
MCV: 82.4 fl (ref 78.0–100.0)
Monocytes Absolute: 0.6 10*3/uL (ref 0.1–1.0)
Monocytes Relative: 9.6 % (ref 3.0–12.0)
Neutro Abs: 3 10*3/uL (ref 1.4–7.7)
Neutrophils Relative %: 50.5 % (ref 43.0–77.0)
Platelets: 270 10*3/uL (ref 150.0–400.0)
RBC: 5.32 Mil/uL — ABNORMAL HIGH (ref 3.87–5.11)
RDW: 13.7 % (ref 11.5–15.5)
WBC: 6 10*3/uL (ref 4.0–10.5)

## 2024-04-30 LAB — URINALYSIS, ROUTINE W REFLEX MICROSCOPIC
Bilirubin Urine: NEGATIVE
Hgb urine dipstick: NEGATIVE
Ketones, ur: NEGATIVE
Leukocytes,Ua: NEGATIVE
Nitrite: NEGATIVE
Specific Gravity, Urine: 1.015 (ref 1.000–1.030)
Total Protein, Urine: NEGATIVE
Urine Glucose: NEGATIVE
Urobilinogen, UA: 0.2 (ref 0.0–1.0)
pH: 6.5 (ref 5.0–8.0)

## 2024-04-30 LAB — POTASSIUM: Potassium: 4 meq/L (ref 3.5–5.1)

## 2024-04-30 LAB — IBC + FERRITIN
Ferritin: 20 ng/mL (ref 10.0–291.0)
Iron: 120 ug/dL (ref 42–145)
Saturation Ratios: 32.3 % (ref 20.0–50.0)
TIBC: 371 ug/dL (ref 250.0–450.0)
Transferrin: 265 mg/dL (ref 212.0–360.0)

## 2024-05-01 ENCOUNTER — Encounter: Payer: Self-pay | Admitting: Internal Medicine

## 2024-05-01 DIAGNOSIS — R002 Palpitations: Secondary | ICD-10-CM | POA: Insufficient documentation

## 2024-05-02 ENCOUNTER — Ambulatory Visit: Payer: Self-pay | Admitting: Internal Medicine

## 2024-05-08 ENCOUNTER — Other Ambulatory Visit: Payer: Self-pay | Admitting: Internal Medicine

## 2024-05-21 ENCOUNTER — Ambulatory Visit: Admitting: Internal Medicine

## 2024-05-21 ENCOUNTER — Encounter: Payer: Self-pay | Admitting: Internal Medicine

## 2024-05-21 VITALS — BP 118/68 | HR 76 | Resp 16 | Ht 62.0 in | Wt 138.6 lb

## 2024-05-21 DIAGNOSIS — R599 Enlarged lymph nodes, unspecified: Secondary | ICD-10-CM | POA: Diagnosis not present

## 2024-05-21 DIAGNOSIS — I471 Supraventricular tachycardia, unspecified: Secondary | ICD-10-CM

## 2024-05-21 DIAGNOSIS — F419 Anxiety disorder, unspecified: Secondary | ICD-10-CM

## 2024-05-21 DIAGNOSIS — R002 Palpitations: Secondary | ICD-10-CM | POA: Diagnosis not present

## 2024-05-21 MED ORDER — CLONAZEPAM 0.5 MG PO TABS: ORAL_TABLET | ORAL | 0 refills | Status: AC

## 2024-05-21 MED ORDER — BUSPIRONE HCL 5 MG PO TABS: 5.0000 mg | ORAL_TABLET | Freq: Every day | ORAL | 0 refills | Status: DC | PRN

## 2024-05-21 NOTE — Progress Notes (Addendum)
 Subjective:    Patient ID: Sara Richardson, female    DOB: 1991-06-20, 33 y.o.   MRN: [id number]  Patient here for  Chief Complaint  Patient presents with   Medical Management of Chronic Issues    HPI Here for a scheduled follow up - follow up regarding increased stress/anxiety. Has buspar . Evaluated in ER 04/14/24 - w/up largely unrevealing. EKG -ST with rightward axis. Saw Dr End 04/29/24 - elected to monitor with f/u planned in 2-3 months.  Reports from a cardiac standpoint she feels things are stable.  She stays very active.  No chest pain or shortness of breath with increased activity or exertion.  Stress is better.  She does still have some increased stress with moving.  Soon to move in with her in-laws until her house is ready.  Overall appears to be handling things well.  The BuSpar  worked well for her.  He does request a refill of the clonazepam .  She rarely uses.   Past Medical History:  Diagnosis Date   Anxiety    SVT (supraventricular tachycardia) (HCC)    no problem since last ablation in 2012   Past Surgical History:  Procedure Laterality Date   ABLATION  2012   x2 heart ablation   LAPAROSCOPIC OVARIAN CYSTECTOMY Left 11/09/2021   Procedure: LAPAROSCOPIC OVARIAN CYSTECTOMY;  Surgeon: Janit Alm Agent, MD;  Location: [facility] ORS;  Service: Gynecology;  Laterality: Left;   LAPAROSCOPIC UNILATERAL SALPINGECTOMY  11/09/2021   Procedure: LAPAROSCOPIC LEFT PARTIAL SALPINGECTOMY;  Surgeon: Janit Alm Agent, MD;  Location: [facility] ORS;  Service: Gynecology;;   Family History  Problem Relation Age of Onset   Breast cancer Maternal Grandmother        lung cancer    Prostate cancer Maternal Grandfather    Hypertension Mother    Heart Problems Father    Social History   Socioeconomic History   Marital status: Married    Spouse name: Concha   Number of children: Not on file   Years of education: Not on file   Highest education level: Not on file  Occupational History    Occupation: Runner, broadcasting/film/video    CommentHorticulturist, commercial - second grade  Tobacco Use   Smoking status: Never   Smokeless tobacco: Never  Vaping Use   Vaping status: Never Used  Substance and Sexual Activity   Alcohol use: Not Currently   Drug use: No   Sexual activity: Yes    Partners: Male    Birth control/protection: Condom  Other Topics Concern   Not on file  Social History Narrative   Not on file   Social Drivers of Health   Financial Resource Strain: Low Risk  (12/24/2023)   Received from [facility]   Overall Financial Resource Strain (CARDIA)    Difficulty of Paying Living Expenses: Not hard at all  Food Insecurity: No Food Insecurity (12/24/2023)   Received from [facility]   Hunger Vital Sign    Within the past 12 months, you worried that your food would run out before you got the money to buy more.: Never true    Within the past 12 months, the food you bought just didn't last and you didn't have money to get more.: Never true  Transportation Needs: No Transportation Needs (12/24/2023)   Received from [facility] - Transportation    In the past 12 months, has lack of transportation kept you from medical appointments or from getting  medications?: No    Lack of Transportation (Non-Medical): No  Physical Activity: Not on file  Stress: Not on file  Social Connections: Not on file     Review of Systems  Constitutional:  Negative for appetite change and unexpected weight change.  HENT:  Negative for congestion and sinus pressure.   Respiratory:  Negative for cough, chest tightness and shortness of breath.   Cardiovascular:  Negative for chest pain, palpitations and leg swelling.  Gastrointestinal:  Negative for abdominal pain, diarrhea, nausea and vomiting.  Genitourinary:  Negative for difficulty urinating and dysuria.  Musculoskeletal:  Negative for joint swelling and myalgias.  Skin:  Negative for color change and  rash.  Neurological:  Negative for dizziness and headaches.  Psychiatric/Behavioral:  Negative for agitation and dysphoric mood.        Objective:     BP 118/68   Pulse 76   Resp 16   Ht 5' 2 (1.575 m)   Wt 138 lb 9.6 oz (62.9 kg)   SpO2 99%   BMI 25.35 kg/m  Wt Readings from Last 3 Encounters:  05/21/24 138 lb 9.6 oz (62.9 kg)  04/29/24 136 lb 12.8 oz (62.1 kg)  04/14/24 137 lb 9.1 oz (62.4 kg)    Physical Exam Vitals reviewed.  Constitutional:      General: She is not in acute distress.    Appearance: Normal appearance.  HENT:     Head: Normocephalic and atraumatic.     Right Ear: External ear normal.     Left Ear: External ear normal.     Mouth/Throat:     Pharynx: No oropharyngeal exudate or posterior oropharyngeal erythema.  Eyes:     General: No scleral icterus.       Right eye: No discharge.        Left eye: No discharge.     Conjunctiva/sclera: Conjunctivae normal.  Neck:     Thyroid: No thyromegaly.  Cardiovascular:     Rate and Rhythm: Normal rate and regular rhythm.  Pulmonary:     Effort: No respiratory distress.     Breath sounds: Normal breath sounds. No wheezing.  Abdominal:     General: Bowel sounds are normal.     Palpations: Abdomen is soft.     Tenderness: There is no abdominal tenderness.  Musculoskeletal:        General: No swelling or tenderness.     Cervical back: Neck supple. No tenderness.  Lymphadenopathy:     Cervical: No cervical adenopathy.  Skin:    Findings: No erythema or rash.  Neurological:     Mental Status: She is alert.  Psychiatric:        Mood and Affect: Mood normal.        Behavior: Behavior normal.         Outpatient Encounter Medications as of 05/21/2024  Medication Sig   ferrous sulfate 325 (65 FE) MG EC tablet Take 325 mg by mouth. (Patient taking differently: Take 325 mg by mouth every other day.)   busPIRone  (BUSPAR ) 5 MG tablet Take 1 tablet (5 mg total) by mouth daily as needed.   clonazePAM   (KLONOPIN ) 0.5 MG tablet Take 1/2 - 1 tablet q day prn.   [DISCONTINUED] busPIRone  (BUSPAR ) 5 MG tablet TAKE 1 TABLET BY MOUTH EVERY DAY AS NEEDED   [DISCONTINUED] clonazePAM  (KLONOPIN ) 0.5 MG tablet Take 1/2 - 1 tablet q day prn.   No facility-administered encounter medications on file as of 05/21/2024.  Lab Results  Component Value Date   WBC 6.0 04/30/2024   HGB 14.5 04/30/2024   HCT 43.8 04/30/2024   PLT 270.0 04/30/2024   GLUCOSE 118 (H) 04/14/2024   CHOL 170 03/19/2024   TRIG 119.0 03/19/2024   HDL 47.40 03/19/2024   LDLCALC 99 03/19/2024   ALT 9 03/19/2024   AST 15 03/19/2024   NA 139 04/14/2024   K 4.0 04/30/2024   CL 106 04/14/2024   CREATININE 0.70 04/14/2024   BUN 11 04/14/2024   CO2 29 04/14/2024   TSH 1.80 03/19/2024    DG Chest 2 View Result Date: 04/14/2024 CLINICAL DATA:  Chest pain EXAM: CHEST - 2 VIEW COMPARISON:  None Available. FINDINGS: The heart size and mediastinal contours are within normal limits. Both lungs are clear. The visualized skeletal structures are unremarkable. IMPRESSION: No active cardiopulmonary disease. Electronically Signed   By: Franky Chard M.D.   On: 04/14/2024 10:08       Assessment & Plan:  Anxiety Assessment & Plan: Overall she appears to be handling things well.  BuSpar  working well for her.  Request refill for clonazepam .  Rarely uses.  Overall things are stable.  Follow-up.   Enlarged lymph node Assessment & Plan: Saw ENT. Felt improved. Recommended no further w/up.  Is continuing to monitor.   Palpitations Assessment & Plan: Evaluated in ER 04/14/24 - w/up largely unrevealing. EKG -ST with rightward axis. Saw Dr End 04/29/24 - elected to monitor with f/u planned in 2-3 months.  Overall doing well. Stable. Follow    SVT (supraventricular tachycardia) (HCC) Assessment & Plan: Has a history of SVT.  Appears to be stable. Follow.    Other orders -     busPIRone  HCl; Take 1 tablet (5 mg total) by mouth daily as  needed.  Dispense: 90 tablet; Refill: 0 -     clonazePAM ; Take 1/2 - 1 tablet q day prn.  Dispense: 20 tablet; Refill: 0     Allena Hamilton, MD

## 2024-05-21 NOTE — Assessment & Plan Note (Signed)
 Overall she appears to be handling things well.  BuSpar  working well for her.  Request refill for clonazepam .  Rarely uses.  Overall things are stable.  Follow-up.

## 2024-05-21 NOTE — Assessment & Plan Note (Signed)
 Evaluated in ER 04/14/24 - w/up largely unrevealing. EKG -ST with rightward axis. Saw Dr End 04/29/24 - elected to monitor with f/u planned in 2-3 months.  Overall doing well. Stable. Follow

## 2024-05-21 NOTE — Assessment & Plan Note (Signed)
 Has a history of SVT.  Appears to be stable. Follow.

## 2024-05-21 NOTE — Assessment & Plan Note (Signed)
 Saw ENT. Felt improved. Recommended no further w/up.  Is continuing to monitor.

## 2024-07-01 ENCOUNTER — Encounter: Payer: Self-pay | Admitting: Internal Medicine

## 2024-07-17 NOTE — Progress Notes (Unsigned)
 Cardiology Clinic Note   Date: 07/19/2024 ID: Annasofia, Pohl 1991-01-27, MRN [id number]  Primary Cardiologist:  Lonni Hanson, MD  Chief Complaint   Sara Richardson is a 33 y.o. female who presents to the clinic today for routine follow up.   Patient Profile   Sara Richardson is followed by Dr. Hanson for the history outlined below.      Past medical history significant for: PSVT. S/p ablation x 2 at [facility].  In summary, patient was first evaluated by Dr. Hanson on 04/29/2024 for chest discomfort and palpitations.  She presented to [facility] clinic urgent care on 04/14/2024 with complaints of chest discomfort and palpitations and was referred to the ED.  Her workup was unremarkable.  EKG demonstrated sinus tachycardia.  She had mild hypokalemia.  She reported doing well at the time of her visit.  She stated earlier in the month she had on and off chest discomfort that felt like a sore muscle but was not tender to palpation.  She had pulled a heavy object the day before the pain began prompting her to speculate that action had brought on the discomfort.  Pain worsened with deep inspiration.  Patient reported a history of 2 SVT ablations performed at [facility] when she was a child.  She denied further heart issues since the ablations.  It was felt her chest discomfort had musculoskeletal etiology.  Further testing of palpitations was deferred.     History of Present Illness    Today, patient reports episodes of air hunger causing her to feel like she is not getting good air in so she has to breath deeply. This will occur on a cycle of getting that sensation, finally getting a good breath or 2 then occurring again. She can go days with no symptoms and then days where it happens on and off all day for days in a row. She endorses a history of anxiety but states the sensation does not always happen in the setting of anxiety. There were a few times she noted the symptoms when she was in a relaxed state. She  is unsure if the sensation is related to palpitations. On intake today HR is elevated to 110 bpm and she notes feeling nervous.She is noted to get winded while talking with speech becoming somewhat pressured and stopping to take a deep, purposeful breath.   Pulseox placed to monitor HR while speaking with patient. It does appear ease of breathing improves as heart rate decrease. HR decreased to 87 bpm during exam. Patient does not believe she has had any runs of SVT since she was a teenager. She previously was symptomatic with a fluttering sensation in her chest. She does feel her heart racing at times but never that fluttering sensation. She does not have chest pain but will sometimes feel a heavy sensation. 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     ROS: All other systems reviewed and are otherwise negative except as noted in History of Present Illness.  EKGs/Labs Reviewed    EKG Interpretation Date/Time:  Monday July 19 2024 15:43:38 EDT Ventricular Rate:  110 PR Interval:  144 QRS Duration:  80 QT Interval:  340 QTC Calculation: 460 R Axis:   86  Text Interpretation: Sinus tachycardia When compared with ECG of 29-Apr-2024 08:44, Heart rate  has increased. Confirmed by Loistine Sober [id number]) on 07/19/2024 3:48:18 PM   03/19/2024: ALT 9; AST 15 04/14/2024: BUN 11; Creatinine, Ser 0.70; Sodium  139 04/30/2024: Potassium 4.0   04/30/2024: Hemoglobin 14.5; WBC 6.0   03/19/2024: TSH 1.80    Physical Exam    VS:  BP 122/70 (BP Location: Left Arm, Patient Position: Sitting, Cuff Size: Normal)   Pulse (!) 110   Ht 5' 2 (1.575 m)   Wt 144 lb (65.3 kg)   SpO2 99%   BMI 26.34 kg/m  , BMI Body mass index is 26.34 kg/m.  GEN: Well nourished, well developed, in no acute distress. Neck: No JVD or carotid bruits. Cardiac:  RRR.  No murmur. No rubs or gallops.   Respiratory:  Respirations regular and unlabored. Clear to auscultation without rales, wheezing or rhonchi. GI:  Soft, nontender, nondistended. Extremities: Radials/DP/PT 2+ and equal bilaterally. No clubbing or cyanosis. No edema.  Skin: Warm and dry, no rash. Neuro: Strength intact.  Assessment & Plan   Palpitations/PSVT S/p ablation x 2 at [facility] as a child.  Patient reports feeling her heart rate increase but no fluttering sensation like she had with SVT. Heart rate 110 bpm on intake. Throughout exam it does decrease down to 87 bpm.  - 2 week Zio.  - BMP, CBC, Mg today.  - Propranolol  10 mg bid prn palpitations.   Dyspnea Patient reports episodes of air hunger causing her to feel like she is not getting good air in so she has to breath deeply. This will occur on a cycle of getting that sensation, finally getting a good breath or 2 then occurring again. She can go days with no symptoms and then days where it happens on and off all day for days in a row. She endorses a history of anxiety but states the sensation does not always happen in the setting of anxiety. There were a few times she noted the symptoms when she was in a relaxed state. She is unsure if the sensation is related to palpitations.  - Schedule echo.   Disposition: Echo. CBC, BMP, Mg today. Return in 8-10 weeks.          Signed, Barnie HERO. Demarrius Guerrero, DNP, NP-C

## 2024-07-19 ENCOUNTER — Ambulatory Visit: Attending: Student | Admitting: Student

## 2024-07-19 ENCOUNTER — Ambulatory Visit (INDEPENDENT_AMBULATORY_CARE_PROVIDER_SITE_OTHER)

## 2024-07-19 ENCOUNTER — Encounter: Payer: Self-pay | Admitting: Student

## 2024-07-19 VITALS — BP 122/70 | HR 110 | Ht 62.0 in | Wt 144.0 lb

## 2024-07-19 DIAGNOSIS — R002 Palpitations: Secondary | ICD-10-CM

## 2024-07-19 DIAGNOSIS — R0989 Other specified symptoms and signs involving the circulatory and respiratory systems: Secondary | ICD-10-CM | POA: Diagnosis not present

## 2024-07-19 DIAGNOSIS — Z79899 Other long term (current) drug therapy: Secondary | ICD-10-CM

## 2024-07-19 DIAGNOSIS — R0602 Shortness of breath: Secondary | ICD-10-CM | POA: Diagnosis not present

## 2024-07-19 MED ORDER — PROPRANOLOL HCL 10 MG PO TABS: 10.0000 mg | ORAL_TABLET | Freq: Two times a day (BID) | ORAL | 3 refills | Status: DC | PRN

## 2024-07-19 NOTE — Patient Instructions (Signed)
 Medication Instructions:  Your physician recommends the following medication changes.   START TAKING: Propranolol  10 mg two times daily as needed for Palpitations  *If you need a refill on your cardiac medications before your next appointment, please call your pharmacy*  Lab Work: Your provider would like for you to have following labs drawn today BMet, CBC, Magnesium.   If you have labs (blood work) drawn today and your tests are completely normal, you will receive your results only by: MyChart Message (if you have MyChart) OR A paper copy in the mail If you have any lab test that is abnormal or we need to change your treatment, we will call you to review the results.  Testing/Procedures: Your physician has requested that you have an Echocardiogram. Echocardiography is a painless test that uses sound waves to create images of your heart. It provides your doctor with information about the size and shape of your heart and how well your heart's chambers and valves are working.   You may receive an ultrasound enhancing agent through an IV if needed to better visualize your heart during the echo. This procedure takes approximately one hour.  There are no restrictions for this procedure.  This will take place at [address] [facility] [address], [place] [address]  Please note: We ask at that you not bring children with you during ultrasound (echo/ vascular) testing. Due to room size and safety concerns, children are not allowed in the ultrasound rooms during exams. Our front office staff cannot provide observation of children in our lobby area while testing is being conducted. An adult accompanying a patient to their appointment will only be allowed in the ultrasound room at the discretion of the ultrasound technician under special circumstances. We apologize for any inconvenience.   Your physician has recommended that you wear a Zio monitor.   This monitor is a medical device  that records the heart's electrical activity. Doctors most often use these monitors to diagnose arrhythmias. Arrhythmias are problems with the speed or rhythm of the heartbeat. The monitor is a small device applied to your chest. You can wear one while you do your normal daily activities. While wearing this monitor if you have any symptoms to push the button and record what you felt. Once you have worn this monitor for the period of time provider prescribed (Usually 14 days), you will return the monitor device in the postage paid box. Once it is returned they will download the data collected and provide us  with a report which the provider will then review and we will call you with those results. Important tips:  Avoid showering during the first 24 hours of wearing the monitor. Avoid excessive sweating to help maximize wear time. Do not submerge the device, no hot tubs, and no swimming pools. Keep any lotions or oils away from the patch. After 24 hours you may shower with the patch on. Take brief showers with your back facing the shower head.  Do not remove patch once it has been placed because that will interrupt data and decrease adhesive wear time. Push the button when you have any symptoms and write down what you were feeling. Once you have completed wearing your monitor, remove and place into box which has postage paid and place in your outgoing mailbox.  If for some reason you have misplaced your box then call our office and we can provide another box and/or mail it off for you.   Follow-Up: At [facility], you and your health needs are our priority.  As part of our continuing mission to provide you with exceptional heart care, our providers are all part of one team.  This team includes your primary Cardiologist (physician) and Advanced Practice Providers or APPs (Physician Assistants and Nurse Practitioners) who all work together to provide you with the care you need, when you need  it.  Your next appointment:   8 - 10 week(s)  Provider:   Barnie Hila, NP    We recommend signing up for the patient portal called MyChart.  Sign up information is provided on this After Visit Summary.  MyChart is used to connect with patients for Virtual Visits (Telemedicine).  Patients are able to view lab/test results, encounter notes, upcoming appointments, etc.  Non-urgent messages can be sent to your provider as well.   To learn more about what you can do with MyChart, go to [email or web address].

## 2024-07-20 ENCOUNTER — Ambulatory Visit: Payer: Self-pay | Admitting: Student

## 2024-07-20 LAB — BASIC METABOLIC PANEL WITH GFR
BUN/Creatinine Ratio: 20 (ref 9–23)
BUN: 14 mg/dL (ref 6–20)
CO2: 20 mmol/L (ref 20–29)
Calcium: 9.7 mg/dL (ref 8.7–10.2)
Chloride: 101 mmol/L (ref 96–106)
Creatinine, Ser: 0.71 mg/dL (ref 0.57–1.00)
Glucose: 98 mg/dL (ref 70–99)
Potassium: 3.7 mmol/L (ref 3.5–5.2)
Sodium: 136 mmol/L (ref 134–144)
eGFR: 116 mL/min/1.73

## 2024-07-20 LAB — MAGNESIUM: Magnesium: 1.7 mg/dL (ref 1.6–2.3)

## 2024-07-20 LAB — CBC
Hematocrit: 41.4 % (ref 34.0–46.6)
Hemoglobin: 13.5 g/dL (ref 11.1–15.9)
MCH: 28.1 pg (ref 26.6–33.0)
MCHC: 32.6 g/dL (ref 31.5–35.7)
MCV: 86 fL (ref 79–97)
Platelets: 315 x10E3/uL (ref 150–450)
RBC: 4.81 x10E6/uL (ref 3.77–5.28)
RDW: 13 % (ref 11.7–15.4)
WBC: 8.8 x10E3/uL (ref 3.4–10.8)

## 2024-07-20 NOTE — Progress Notes (Signed)
 Last read by Caretha L Shober at 8:10AM on 07/20/2024.

## 2024-08-02 ENCOUNTER — Other Ambulatory Visit: Payer: Self-pay | Admitting: Student

## 2024-08-13 DIAGNOSIS — R002 Palpitations: Secondary | ICD-10-CM

## 2024-08-16 NOTE — Telephone Encounter (Signed)
 Last read by Caretha CROME Damaso at 10:49AM on 08/16/2024.

## 2024-08-23 ENCOUNTER — Ambulatory Visit: Admitting: Internal Medicine

## 2024-08-31 ENCOUNTER — Ambulatory Visit: Attending: Student

## 2024-08-31 DIAGNOSIS — R0602 Shortness of breath: Secondary | ICD-10-CM

## 2024-08-31 LAB — ECHOCARDIOGRAM COMPLETE
AR max vel: 2.25 cm2
AV Area VTI: 2.25 cm2
AV Area mean vel: 2.35 cm2
AV Mean grad: 6 mmHg
AV Peak grad: 12 mmHg
Ao pk vel: 1.73 m/s
Area-P 1/2: 5.13 cm2
S' Lateral: 2.63 cm

## 2024-09-01 NOTE — Progress Notes (Signed)
 Last read by Caretha L Bores at 9:05AM on 09/01/2024.

## 2024-09-10 NOTE — Progress Notes (Signed)
 Cardiology Clinic Note   Date: 09/13/2024 ID: Sara Richardson, DOB 1991-03-06, MRN [id number]  Primary Cardiologist:  Lonni Hanson, MD  Chief Complaint   Sara Richardson is a 33 y.o. female who presents to the clinic today for follow up after testing.   Patient Profile   Sara Richardson is followed by Dr. Hanson for the history outlined below.       Past medical history significant for: PSVT. S/p ablation x 2 at [facility]. 14-day Zio 08/06/2024: HR 45 to 188 bpm, average 80 bpm.  Rare PACs/PVCs.  No sustained arrhythmias or prolonged pauses.  Patient triggered events corresponded with NSR, sinus tachycardia, PACs/PVCs. Echo 08/31/2024: EF 60 to 65%.  No RWMA.  Normal diastolic parameters.  Normal global strain.  Normal RV size/function.  Mild MR.  In summary, patient was first evaluated by Dr. Hanson on 04/29/2024 for chest discomfort and palpitations.  She presented to [facility] clinic urgent care on 04/14/2024 with complaints of chest discomfort and palpitations and was referred to the ED.  Her workup was unremarkable.  EKG demonstrated sinus tachycardia.  She had mild hypokalemia.  She reported doing well at the time of her visit.  She stated earlier in the month she had on and off chest discomfort that felt like a sore muscle but was not tender to palpation.  She had pulled a heavy object the day before the pain began prompting her to speculate that action had brought on the discomfort.  Pain worsened with deep inspiration.  Patient reported a history of 2 SVT ablations performed at [facility] when she was a child.  She denied further heart issues since the ablations.  It was felt her chest discomfort had musculoskeletal etiology.  Further testing of palpitations was deferred.   Patient was last seen in the office by me on 07/19/2024 for routine follow-up.  She reported episodes of air hunger causing her to feel like she was not getting good air prompting her to take deep breaths until the sensation subsided  only to return.  She reported going days without symptoms and then other times when the sensation occurred on and off for several days in a row.  She did reported stress/anxiety however stated sensation was not only occurring in the setting of anxiety.  HR was elevated on intake at 110 bpm with patient stating that she was nervous.  Heart rate decreased to 87 bpm during exam.  She did not believe that she was experiencing more runs of SVT like she had when she was a teenager.  She was provided with an Rx for propranolol  to take as needed.  14-day ZIO demonstrated no sustained arrhythmias.  Echo was normal with mild MR.     History of Present Illness    Today, patient is accompanied by her husband. She reports taking propranolol  in a couple of situations that she knew would bring on symptoms. She thinks it helped but states I need more data to be sure. She still gets episodes of air hunger with and without anxiety. There are time she is walking somewhere at school and will feel herself pressing her hands to her thighs to elongate her torso to get in better air. She does exercise regularly. She has never been worked up for asthma. Discussed Zio and echo results in detail. All questions answered.     ROS: All other systems reviewed and are otherwise negative except as noted in History of Present Illness.  EKGs/Labs Reviewed  EKG is not performed today.   03/19/2024: ALT 9; AST 15 07/19/2024: BUN 14; Creatinine, Ser 0.71; Potassium 3.7; Sodium 136   07/19/2024: Hemoglobin 13.5; WBC 8.8   03/19/2024: TSH 1.80    Physical Exam    VS:  BP 112/70 (BP Location: Left Arm, Patient Position: Sitting, Cuff Size: Normal)   Pulse 90   Ht 5' 2 (1.575 m)   Wt 142 lb 6 oz (64.6 kg)   SpO2 98%   BMI 26.04 kg/m  , BMI Body mass index is 26.04 kg/m.  GEN: Well nourished, well developed, in no acute distress. Neck: No JVD or carotid bruits. Cardiac:  RRR.  No murmur. No rubs or gallops.    Respiratory:  Respirations regular and unlabored. Clear to auscultation without rales, wheezing or rhonchi. GI: Soft, nontender, nondistended. Extremities: Radials/DP/PT 2+ and equal bilaterally. No clubbing or cyanosis. No edema   Skin: Warm and dry, no rash. Neuro: Strength intact.  Assessment & Plan   Palpitations/PSVT S/p ablation x 2 at [facility] as a child.  14-day ZIO September 2025 demonstrated HR 45 to 188 bpm, average 80 bpm, rare PACs/PVCs, no sustained arrhythmias.  Patient has taken propranolol  a couple of times in situations she knew would bring on symptoms and thinks it may have helped. Discussed results of Zio with patient in detail. Encouraged continued use of propranolol . RRR on exam today.  -Continue propranolol  10 mg bid prn palpitations.    Dyspnea Echo October 2025 demonstrated normal LV/RV function, normal diastolic parameters, mild MR.  Patient reports she is still getting episodes of air hunger with and without anxiety. She reports sometimes when she is walking at school she will feel herself pressing her hands into her thighs to elongate her torso to get in better air. Breath sounds are clear to ausculation.  - Recommended mentioning to PCP for possible asthma workup.   Disposition: Return in 1 year or sooner as needed.          Signed, Barnie HERO. Cruzito Standre, DNP, NP-C

## 2024-09-13 ENCOUNTER — Ambulatory Visit: Attending: Student | Admitting: Student

## 2024-09-13 ENCOUNTER — Encounter: Payer: Self-pay | Admitting: Student

## 2024-09-13 VITALS — BP 112/70 | HR 90 | Ht 62.0 in | Wt 142.4 lb

## 2024-09-13 DIAGNOSIS — R0602 Shortness of breath: Secondary | ICD-10-CM

## 2024-09-13 DIAGNOSIS — R002 Palpitations: Secondary | ICD-10-CM

## 2024-09-13 DIAGNOSIS — I471 Supraventricular tachycardia, unspecified: Secondary | ICD-10-CM

## 2024-09-13 NOTE — Patient Instructions (Signed)
 Medication Instructions:   Your physician recommends that you continue on your current medications as directed. Please refer to the Current Medication list given to you today.    *If you need a refill on your cardiac medications before your next appointment, please call your pharmacy*  Lab Work:  None ordered at this time   If you have labs (blood work) drawn today and your tests are completely normal, you will receive your results only by:  MyChart Message (if you have MyChart) OR  A paper copy in the mail If you have any lab test that is abnormal or we need to change your treatment, we will call you to review the results.  Testing/Procedures:  None ordered at this time   Referrals:  None ordered at this time   Follow-Up:  At [facility], you and your health needs are our priority.  As part of our continuing mission to provide you with exceptional heart care, our providers are all part of one team.  This team includes your primary Cardiologist (physician) and Advanced Practice Providers or APPs (Physician Assistants and Nurse Practitioners) who all work together to provide you with the care you need, when you need it.  Your next appointment:   1 year(s)  Provider:    Lonni Hanson, MD or Barnie Hila, NP    We recommend signing up for the patient portal called MyChart.  Sign up information is provided on this After Visit Summary.  MyChart is used to connect with patients for Virtual Visits (Telemedicine).  Patients are able to view lab/test results, encounter notes, upcoming appointments, etc.  Non-urgent messages can be sent to your provider as well.   To learn more about what you can do with MyChart, go to [email or web address].

## 2024-09-23 ENCOUNTER — Encounter: Payer: Self-pay | Admitting: Internal Medicine

## 2024-09-23 ENCOUNTER — Ambulatory Visit: Admitting: Internal Medicine

## 2024-09-23 VITALS — BP 100/50 | HR 75 | Temp 97.6°F | Ht 62.0 in | Wt 142.8 lb

## 2024-09-23 DIAGNOSIS — I471 Supraventricular tachycardia, unspecified: Secondary | ICD-10-CM | POA: Diagnosis not present

## 2024-09-23 DIAGNOSIS — Z8759 Personal history of other complications of pregnancy, childbirth and the puerperium: Secondary | ICD-10-CM

## 2024-09-23 DIAGNOSIS — Z13220 Encounter for screening for lipoid disorders: Secondary | ICD-10-CM

## 2024-09-23 DIAGNOSIS — R002 Palpitations: Secondary | ICD-10-CM

## 2024-09-23 DIAGNOSIS — N920 Excessive and frequent menstruation with regular cycle: Secondary | ICD-10-CM | POA: Insufficient documentation

## 2024-09-23 DIAGNOSIS — R790 Abnormal level of blood mineral: Secondary | ICD-10-CM

## 2024-09-23 DIAGNOSIS — F419 Anxiety disorder, unspecified: Secondary | ICD-10-CM

## 2024-09-23 DIAGNOSIS — R0602 Shortness of breath: Secondary | ICD-10-CM

## 2024-09-23 DIAGNOSIS — Z79899 Other long term (current) drug therapy: Secondary | ICD-10-CM | POA: Diagnosis not present

## 2024-09-23 MED ORDER — BUSPIRONE HCL 5 MG PO TABS: 5.0000 mg | ORAL_TABLET | Freq: Every day | ORAL | 0 refills | Status: AC | PRN

## 2024-09-23 NOTE — Assessment & Plan Note (Signed)
 Seeing gyn. Trial of ibuprofen  as outlined. Has planned f/u in December.  Follow.

## 2024-09-23 NOTE — Assessment & Plan Note (Signed)
 Had f/u with cardiology 09/13/24 - 14-day ZIO September 2025 demonstrated HR 45 to 188 bpm, average 80 bpm, rare PACs/PVCs, no sustained arrhythmias. Continue propranolol  prn. Also reported dyspnea. ECHO - normal LV function, mild MR. Recommended possible evaluation for asthma - w/up. Discussed. Does feel propranolol  helps.

## 2024-09-23 NOTE — Assessment & Plan Note (Signed)
 Has a history of SVT.  Saw cardiology. Had f/u with cardiology 09/13/24 - 14-day ZIO September 2025 demonstrated HR 45 to 188 bpm, average 80 bpm, rare PACs/PVCs, no sustained arrhythmias. Continue propranolol  prn. Also reported dyspnea. ECHO - normal LV function, mild MR. Recommended possible evaluation for asthma - w/up. Discussed. Does feel propranolol  helps.

## 2024-09-23 NOTE — Assessment & Plan Note (Signed)
 Overall she appears to be handling things well.  Has buspar  if needed. Feels propranolol  helped - recent funeral. Follow. No changes in medication.

## 2024-09-23 NOTE — Assessment & Plan Note (Signed)
 Blood pressure doing well. Recheck by me 114/68.

## 2024-09-23 NOTE — Assessment & Plan Note (Signed)
 Had f/u with cardiology 09/13/24 - 14-day ZIO September 2025 demonstrated HR 45 to 188 bpm, average 80 bpm, rare PACs/PVCs, no sustained arrhythmias. Continue propranolol  prn. Also reported dyspnea. ECHO - normal LV function, mild MR. Recommended possible evaluation for asthma - w/up. Discussed. Does feel propranolol  helps. Discussed further w/up as outlined. Wants to monitor.

## 2024-09-23 NOTE — Progress Notes (Signed)
 Subjective:    Patient ID: Sara Richardson, female    DOB: 12-26-1990, 33 y.o.   MRN: [id number]  Patient here for  Chief Complaint  Patient presents with   Medical Management of Chronic Issues    3 mth f/u    HPI Here for a scheduled follow up - follow up regarding increased stress and anxiety. Continues on buspar . Had f/u with cardiology 09/13/24 - 14-day ZIO September 2025 demonstrated HR 45 to 188 bpm, average 80 bpm, rare PACs/PVCs, no sustained arrhythmias. Continue propranolol  prn. Also reported dyspnea. ECHO - normal LV function, mild MR. Recommended possible evaluation for asthma - w/up. Discussed. Does feel propranolol  helps. Still with some feelings of not getting a good breath, but overall managing at this point. Discussed further evaluation, for example - PFTs, pulmonary evaluation. Wants to monitor. Had f/u with gyn - heavy menstrual bleeding. Recommended ibuprofen . Consider lysteda. Ultrasound if bleeding persists. She has a f/u planned in December and is planning to discuss. Feels ibuprofen  may have helped some - the period before last. Did not take prior to her recent period. School stress is better. Overall appears to be handling things well.    Past Medical History:  Diagnosis Date   Anxiety    SVT (supraventricular tachycardia)    no problem since last ablation in 2012   Past Surgical History:  Procedure Laterality Date   ABLATION  2012   x2 heart ablation   LAPAROSCOPIC OVARIAN CYSTECTOMY Left 11/09/2021   Procedure: LAPAROSCOPIC OVARIAN CYSTECTOMY;  Surgeon: Janit Alm Agent, MD;  Location: [facility] ORS;  Service: Gynecology;  Laterality: Left;   LAPAROSCOPIC UNILATERAL SALPINGECTOMY  11/09/2021   Procedure: LAPAROSCOPIC LEFT PARTIAL SALPINGECTOMY;  Surgeon: Janit Alm Agent, MD;  Location: [facility] ORS;  Service: Gynecology;;   Family History  Problem Relation Age of Onset   Breast cancer Maternal Grandmother        lung cancer    Prostate cancer Maternal  Grandfather    Hypertension Mother    Heart Problems Father    Social History   Socioeconomic History   Marital status: Married    Spouse name: Concha   Number of children: Not on file   Years of education: Not on file   Highest education level: Not on file  Occupational History   Occupation: Runner, Broadcasting/film/video    CommentHorticulturist, Commercial - second grade  Tobacco Use   Smoking status: Never   Smokeless tobacco: Never  Vaping Use   Vaping status: Never Used  Substance and Sexual Activity   Alcohol use: Not Currently   Drug use: No   Sexual activity: Yes    Partners: Male    Birth control/protection: Condom  Other Topics Concern   Not on file  Social History Narrative   Not on file   Social Drivers of Health   Financial Resource Strain: Low Risk  (07/29/2024)   Received from [facility]   Overall Financial Resource Strain (CARDIA)    Difficulty of Paying Living Expenses: Not hard at all  Food Insecurity: No Food Insecurity (07/29/2024)   Received from [facility]   Hunger Vital Sign    Within the past 12 months, you worried that your food would run out before you got the money to buy more.: Never true    Within the past 12 months, the food you bought just didn't last and you didn't have money to get more.: Never true  Transportation Needs: No Transportation Needs (07/29/2024)  Received from [facility] - Transportation    In the past 12 months, has lack of transportation kept you from medical appointments or from getting medications?: No    Lack of Transportation (Non-Medical): No  Physical Activity: Not on file  Stress: Not on file  Social Connections: Not on file     Review of Systems  Constitutional:  Negative for appetite change and unexpected weight change.  HENT:  Negative for congestion and sinus pressure.   Respiratory:  Negative for cough, chest tightness and shortness of breath.   Cardiovascular:  Negative  for chest pain, palpitations and leg swelling.  Gastrointestinal:  Negative for abdominal pain, diarrhea, nausea and vomiting.  Genitourinary:  Negative for difficulty urinating and dysuria.  Musculoskeletal:  Negative for joint swelling and myalgias.  Skin:  Negative for color change and rash.  Neurological:  Negative for dizziness and headaches.  Psychiatric/Behavioral:  Negative for agitation and dysphoric mood.        Objective:     BP (!) 100/50   Pulse 75   Temp 97.6 F (36.4 C) (Oral)   Ht 5' 2 (1.575 m)   Wt 142 lb 12 oz (64.8 kg)   SpO2 99%   BMI 26.11 kg/m  Wt Readings from Last 3 Encounters:  09/23/24 142 lb 12 oz (64.8 kg)  09/13/24 142 lb 6 oz (64.6 kg)  07/19/24 144 lb (65.3 kg)    Physical Exam Vitals reviewed.  Constitutional:      General: She is not in acute distress.    Appearance: Normal appearance.  HENT:     Head: Normocephalic and atraumatic.     Right Ear: External ear normal.     Left Ear: External ear normal.     Mouth/Throat:     Pharynx: No oropharyngeal exudate or posterior oropharyngeal erythema.  Eyes:     General: No scleral icterus.       Right eye: No discharge.        Left eye: No discharge.     Conjunctiva/sclera: Conjunctivae normal.  Neck:     Thyroid: No thyromegaly.  Cardiovascular:     Rate and Rhythm: Normal rate and regular rhythm.  Pulmonary:     Effort: No respiratory distress.     Breath sounds: Normal breath sounds. No wheezing.  Abdominal:     General: Bowel sounds are normal.     Palpations: Abdomen is soft.     Tenderness: There is no abdominal tenderness.  Musculoskeletal:        General: No swelling or tenderness.     Cervical back: Neck supple. No tenderness.  Lymphadenopathy:     Cervical: No cervical adenopathy.  Skin:    Findings: No erythema or rash.  Neurological:     Mental Status: She is alert.  Psychiatric:        Mood and Affect: Mood normal.        Behavior: Behavior normal.          Outpatient Encounter Medications as of 09/23/2024  Medication Sig   clonazePAM  (KLONOPIN ) 0.5 MG tablet Take 1/2 - 1 tablet q day prn. (Patient taking differently: Take 0.5 mg by mouth as needed for anxiety. Take 1/2 - 1 tablet q day prn.)   propranolol  (INDERAL ) 10 MG tablet TAKE 1 TABLET (10 MG TOTAL) BY MOUTH 2 (TWO) TIMES DAILY AS NEEDED (FOR PALPITATIONS).   busPIRone  (BUSPAR ) 5 MG tablet Take 1 tablet (5 mg total) by mouth  daily as needed.   [DISCONTINUED] busPIRone  (BUSPAR ) 5 MG tablet Take 1 tablet (5 mg total) by mouth daily as needed.   [DISCONTINUED] ferrous sulfate 325 (65 FE) MG EC tablet Take 325 mg by mouth. (Patient not taking: Reported on 09/13/2024)   No facility-administered encounter medications on file as of 09/23/2024.     Lab Results  Component Value Date   WBC 8.8 07/19/2024   HGB 13.5 07/19/2024   HCT 41.4 07/19/2024   PLT 315 07/19/2024   GLUCOSE 98 07/19/2024   CHOL 170 03/19/2024   TRIG 119.0 03/19/2024   HDL 47.40 03/19/2024   LDLCALC 99 03/19/2024   ALT 9 03/19/2024   AST 15 03/19/2024   NA 136 07/19/2024   K 3.7 07/19/2024   CL 101 07/19/2024   CREATININE 0.71 07/19/2024   BUN 14 07/19/2024   CO2 20 07/19/2024   TSH 1.80 03/19/2024    DG Chest 2 View Result Date: 04/14/2024 CLINICAL DATA:  Chest pain EXAM: CHEST - 2 VIEW COMPARISON:  None Available. FINDINGS: The heart size and mediastinal contours are within normal limits. Both lungs are clear. The visualized skeletal structures are unremarkable. IMPRESSION: No active cardiopulmonary disease. Electronically Signed   By: Franky Chard M.D.   On: 04/14/2024 10:08       Assessment & Plan:  SVT (supraventricular tachycardia) Assessment & Plan: Has a history of SVT.  Saw cardiology. Had f/u with cardiology 09/13/24 - 14-day ZIO September 2025 demonstrated HR 45 to 188 bpm, average 80 bpm, rare PACs/PVCs, no sustained arrhythmias. Continue propranolol  prn. Also reported dyspnea. ECHO - normal LV  function, mild MR. Recommended possible evaluation for asthma - w/up. Discussed. Does feel propranolol  helps.    Medication management  Low ferritin level  Screening cholesterol level  SOB (shortness of breath) Assessment & Plan: Had f/u with cardiology 09/13/24 - 14-day ZIO September 2025 demonstrated HR 45 to 188 bpm, average 80 bpm, rare PACs/PVCs, no sustained arrhythmias. Continue propranolol  prn. Also reported dyspnea. ECHO - normal LV function, mild MR. Recommended possible evaluation for asthma - w/up. Discussed. Does feel propranolol  helps. Discussed further w/up as outlined. Wants to monitor.    Palpitations Assessment & Plan: Had f/u with cardiology 09/13/24 - 14-day ZIO September 2025 demonstrated HR 45 to 188 bpm, average 80 bpm, rare PACs/PVCs, no sustained arrhythmias. Continue propranolol  prn. Also reported dyspnea. ECHO - normal LV function, mild MR. Recommended possible evaluation for asthma - w/up. Discussed. Does feel propranolol  helps.    History of gestational hypertension Assessment & Plan: Blood pressure doing well. Recheck by me 114/68.    Anxiety Assessment & Plan: Overall she appears to be handling things well.  Has buspar  if needed. Feels propranolol  helped - recent funeral. Follow. No changes in medication.    Menorrhagia with regular cycle Assessment & Plan: Seeing gyn. Trial of ibuprofen  as outlined. Has planned f/u in December.  Follow.    Other orders -     busPIRone  HCl; Take 1 tablet (5 mg total) by mouth daily as needed.  Dispense: 90 tablet; Refill: 0     Allena Hamilton, MD

## 2024-11-22 ENCOUNTER — Encounter: Payer: Self-pay | Admitting: Internal Medicine

## 2025-03-24 ENCOUNTER — Ambulatory Visit: Admitting: Internal Medicine
